# Patient Record
Sex: Female | Born: 1990 | Hispanic: No | Marital: Single | Smoking: Never smoker
Health system: Southern US, Community
[De-identification: ages and names within clinical notes are randomized; demographics above are authoritative.]

## PROBLEM LIST (undated history)

## (undated) DIAGNOSIS — N939 Abnormal uterine and vaginal bleeding, unspecified: Secondary | ICD-10-CM

## (undated) DIAGNOSIS — M1990 Unspecified osteoarthritis, unspecified site: Secondary | ICD-10-CM

## (undated) DIAGNOSIS — K8020 Calculus of gallbladder without cholecystitis without obstruction: Secondary | ICD-10-CM

## (undated) DIAGNOSIS — J4530 Mild persistent asthma, uncomplicated: Secondary | ICD-10-CM

## (undated) DIAGNOSIS — R102 Pelvic and perineal pain unspecified side: Secondary | ICD-10-CM

## (undated) DIAGNOSIS — M797 Fibromyalgia: Secondary | ICD-10-CM

## (undated) DIAGNOSIS — J45909 Unspecified asthma, uncomplicated: Secondary | ICD-10-CM

## (undated) DIAGNOSIS — I441 Atrioventricular block, second degree: Secondary | ICD-10-CM

## (undated) DIAGNOSIS — D509 Iron deficiency anemia, unspecified: Secondary | ICD-10-CM

## (undated) DIAGNOSIS — R002 Palpitations: Secondary | ICD-10-CM

## (undated) DIAGNOSIS — Z973 Presence of spectacles and contact lenses: Secondary | ICD-10-CM

## (undated) DIAGNOSIS — F329 Major depressive disorder, single episode, unspecified: Secondary | ICD-10-CM

## (undated) DIAGNOSIS — F411 Generalized anxiety disorder: Secondary | ICD-10-CM

## (undated) DIAGNOSIS — K219 Gastro-esophageal reflux disease without esophagitis: Secondary | ICD-10-CM

## (undated) DIAGNOSIS — G8929 Other chronic pain: Secondary | ICD-10-CM

## (undated) DIAGNOSIS — N809 Endometriosis, unspecified: Secondary | ICD-10-CM

---
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
## 2010-10-15 ENCOUNTER — Inpatient Hospital Stay (INDEPENDENT_AMBULATORY_CARE_PROVIDER_SITE_OTHER)
Admission: RE | Admit: 2010-10-15 | Discharge: 2010-10-15 | Disposition: A | Discharge status: Home / Self Care | Payer: Self-pay | Source: Ambulatory Visit | Attending: Emergency Medicine | Admitting: Emergency Medicine

## 2010-10-15 DIAGNOSIS — R0789 Other chest pain: Secondary | ICD-10-CM

## 2012-10-07 ENCOUNTER — Emergency Department (HOSPITAL_COMMUNITY): Payer: BC Managed Care – PPO

## 2012-10-07 ENCOUNTER — Encounter (HOSPITAL_COMMUNITY): Payer: Self-pay

## 2012-10-07 ENCOUNTER — Emergency Department (HOSPITAL_COMMUNITY)
Admission: EM | Admit: 2012-10-07 | Discharge: 2012-10-07 | Disposition: A | Discharge status: Home / Self Care | Payer: BC Managed Care – PPO | Attending: Emergency Medicine | Admitting: Emergency Medicine

## 2012-10-07 DIAGNOSIS — R071 Chest pain on breathing: Secondary | ICD-10-CM | POA: Insufficient documentation

## 2012-10-07 DIAGNOSIS — Z3202 Encounter for pregnancy test, result negative: Secondary | ICD-10-CM | POA: Insufficient documentation

## 2012-10-07 DIAGNOSIS — J45901 Unspecified asthma with (acute) exacerbation: Secondary | ICD-10-CM | POA: Insufficient documentation

## 2012-10-07 HISTORY — DX: Unspecified asthma, uncomplicated: J45.909

## 2012-10-07 LAB — BASIC METABOLIC PANEL
BUN: 18 mg/dL (ref 6–23)
CO2: 25 mEq/L (ref 19–32)
Chloride: 99 mEq/L (ref 96–112)
GFR calc non Af Amer: >90 mL/min (ref >90)
Glucose, Bld: 89 mg/dL (ref 70–99)
Potassium: 3.7 mEq/L (ref 3.5–5.1)

## 2012-10-07 LAB — CBC
MCHC: 35.4 g/dL (ref 30.0–36.0)
RBC: 4.34 MIL/uL (ref 3.87–5.11)
WBC: 7.4 10*3/uL (ref 4.0–10.5)

## 2012-10-07 LAB — D-DIMER, QUANTITATIVE: D-Dimer, Quant: <0.27 ug/mL-FEU (ref 0.00–0.48)

## 2012-10-07 MED ORDER — ALBUTEROL SULFATE (5 MG/ML) 0.5% IN NEBU
5.0000 mg | INHALATION_SOLUTION | Freq: Once | RESPIRATORY_TRACT | Status: AC
  Administered 2012-10-07: 5 mg via RESPIRATORY_TRACT
  Filled 2012-10-07: qty 1

## 2012-10-07 NOTE — ED Provider Notes (Signed)
CSN: [id number]     Arrival date & time 10/07/12  1130 History   First MD Initiated Contact with Patient 10/07/12 1157     Chief Complaint  Patient presents with  . Chest Pain  . Shortness of Breath   (Consider location/radiation/quality/duration/timing/severity/associated sxs/prior Treatment) HPI  22 year old female with history of asthma presents with complaints of chest pain, and shortness of breath. Patient reports she was in class an hour ago when she developed an achy sensation to her upper back that spread to her back and radiates towards her mid chest. Pain is gradual but has been improving. However she endorses having difficulty breathing as if she cannot catch a full breath. Shortness of breath is worse with exhaling as compared to inhaling.  Does report mild shortness of breath with walking. Does report mild pleuritic chest discomfort. She denies fever, chills, productive cough, hemoptysis, nausea, vomiting, diarrhea, abdominal pain, lightheadedness, dizziness, numbness, weakness, calf pain, or leg swelling. Patient does take birth control pill. Is a nonsmoker. No prior history of PE or DVT, cancer, recent surgery, prolonged bed rest. No recent trauma. No family history of premature cardiac death.  Past Medical History  Diagnosis Date  . Asthma    History reviewed. No pertinent past surgical history. History reviewed. No pertinent family history. History  Substance Use Topics  . Smoking status: Never Smoker   . Smokeless tobacco: Not on file  . Alcohol Use: No   OB History   Grav Para Term Preterm Abortions TAB SAB Ect Mult Living                 Review of Systems  Constitutional: Negative for fever.  Respiratory: Positive for chest tightness and shortness of breath. Negative for cough and wheezing.   Cardiovascular: Positive for chest pain. Negative for palpitations.  Skin: Negative for rash.  Neurological: Negative for numbness.  All other systems reviewed and are  negative.    Allergies  Review of patient's allergies indicates not on file.  Home Medications  No current outpatient prescriptions on file. BP 131/78  Pulse 98  Temp(Src) 98.5 F (36.9 C) (Oral)  Wt 190 lb (86.183 kg)  SpO2 100% Physical Exam  Nursing note and vitals reviewed. Constitutional: She is oriented to person, place, and time. She appears well-developed and well-nourished.  Mildly anxious   HENT:  Head: Atraumatic.  Mouth/Throat: Oropharynx is clear and moist.  Eyes: Conjunctivae are normal.  Neck: Normal range of motion. Neck supple. No JVD present. No tracheal deviation present.  Cardiovascular:  Mild tachycardia without murmurs, rubs, or gallops noted  Pulmonary/Chest: Effort normal and breath sounds normal. No stridor. No respiratory distress. She has no wheezes. She exhibits tenderness (Mild anterior chest wall tenderness on palpation (chronic), no paradoxical movement, no crepitus, no emphysema, no rash).  Lungs are clear to auscultation bilaterally  Abdominal: Soft. There is no tenderness.  Musculoskeletal: She exhibits no edema.  Bilateral lower extremities without palpable cord, erythema, edema, negative Homans sign  Lymphadenopathy:    She has no cervical adenopathy.  Neurological: She is alert and oriented to person, place, and time.  Skin: Skin is warm. No rash noted.  Psychiatric: She has a normal mood and affect.    ED Course  Procedures (including critical care time)\   Date: 10/07/2012  Rate: 78  Rhythm: normal sinus rhythm  QRS Axis: normal  Intervals: normal  ST/T Wave abnormalities: normal  Conduction Disutrbances: none  Narrative Interpretation:   Old EKG Reviewed: none  for comparison     12:14 PM Patient here with chest pain and shortness of breath. Although she has a history of asthma she has no wheezes on exam and currently sats are 100% on room air. She is mildly tachycardic and does take birth control pill which increased risk  of the potential PE. We'll obtain a d-dimer and obtain chest x-ray. We'll also check to rule out pneumothorax, low suspicion as her lung exam is unremarkable. Her EKG shows no acute ischemic changes.  2:38 PM D-dimer is negative. Chest x-ray is unremarkable. Labs are reassuring. Pregnancy test negative. Normal troponin and normal EKG. No significant risk factors. Patient felt better although does endorse a mild chest discomfort only. She does have breathing treatment at home. She has a primary care Dr. that she agrees to followup. Return precautions discussed. Otherwise patient stable for discharge at this time.  Lab Review Labs Reviewed  CBC  BASIC METABOLIC PANEL  D-DIMER, QUANTITATIVE  POCT PREGNANCY, URINE  POCT I-STAT TROPONIN I   Imaging Review Dg Chest 2 View  10/07/2012   CLINICAL DATA:  Chest pain with shortness of breath  EXAM: CHEST  2 VIEW  COMPARISON:  Jun 02, 2003  FINDINGS: The lungs are clear. Heart size and pulmonary vascularity are normal. No pneumothorax. No adenopathy. No bone lesions.  IMPRESSION: No abnormality noted.   Electronically Signed   By: Bretta Bang   On: 10/07/2012 13:18    MDM   1. Chest pain on breathing    BP 109/60  Pulse 70  Temp(Src) 98.5 F (36.9 C) (Oral)  Wt 190 lb (86.183 kg)  SpO2 99%  LMP 09/23/2012  I have reviewed nursing notes and vital signs. I personally reviewed the imaging tests through PACS system  I reviewed available ER/hospitalization records thought the EMR     Fayrene Helper, [place] 10/07/12 1439

## 2012-10-07 NOTE — ED Notes (Signed)
MD at bedside. 

## 2012-10-07 NOTE — ED Provider Notes (Signed)
Medical screening examination/treatment/procedure(s) were performed by non-physician practitioner and as supervising physician I was immediately available for consultation/collaboration. Devoria Albe, MD, Armando Gang   Ward Givens, MD 10/07/12 [phone]

## 2012-10-07 NOTE — ED Notes (Signed)
Pt reports chest pain and sob, onset 1 hour ago. No help with inhaler in past, hx of asthma. reprots hard to swallow.

## 2015-11-01 ENCOUNTER — Emergency Department (HOSPITAL_COMMUNITY)
Admission: EM | Admit: 2015-11-01 | Discharge: 2015-11-01 | Disposition: A | Discharge status: Home / Self Care | Payer: BLUE CROSS/BLUE SHIELD | Attending: Emergency Medicine | Admitting: Emergency Medicine

## 2015-11-01 ENCOUNTER — Emergency Department (HOSPITAL_COMMUNITY): Payer: BLUE CROSS/BLUE SHIELD

## 2015-11-01 ENCOUNTER — Encounter (HOSPITAL_COMMUNITY): Payer: Self-pay | Admitting: Emergency Medicine

## 2015-11-01 DIAGNOSIS — M546 Pain in thoracic spine: Secondary | ICD-10-CM | POA: Diagnosis not present

## 2015-11-01 DIAGNOSIS — J45909 Unspecified asthma, uncomplicated: Secondary | ICD-10-CM | POA: Diagnosis not present

## 2015-11-01 DIAGNOSIS — R0602 Shortness of breath: Secondary | ICD-10-CM | POA: Insufficient documentation

## 2015-11-01 DIAGNOSIS — M545 Low back pain: Secondary | ICD-10-CM | POA: Insufficient documentation

## 2015-11-01 DIAGNOSIS — M549 Dorsalgia, unspecified: Secondary | ICD-10-CM

## 2015-11-01 HISTORY — DX: Endometriosis, unspecified: N80.9

## 2015-11-01 LAB — I-STAT CHEM 8, ED
BUN: 19 mg/dL (ref 6–20)
Calcium, Ion: 1.15 mmol/L (ref 1.15–1.40)
Chloride: 105 mmol/L (ref 101–111)
Creatinine, Ser: 0.9 mg/dL (ref 0.44–1.00)
Glucose, Bld: 120 mg/dL — ABNORMAL HIGH (ref 65–99)
HCT: 35 % — ABNORMAL LOW (ref 36.0–46.0)
Hemoglobin: 11.9 g/dL — ABNORMAL LOW (ref 12.0–15.0)
Potassium: 3.8 mmol/L (ref 3.5–5.1)
Sodium: 140 mmol/L (ref 135–145)
TCO2: 23 mmol/L (ref 0–100)

## 2015-11-01 MED ORDER — IBUPROFEN 400 MG PO TABS
600.0000 mg | ORAL_TABLET | Freq: Once | ORAL | Status: AC
  Administered 2015-11-01: 600 mg via ORAL
  Filled 2015-11-01: qty 1

## 2015-11-01 MED ORDER — OXYCODONE-ACETAMINOPHEN 5-325 MG PO TABS
1.0000 | ORAL_TABLET | Freq: Once | ORAL | Status: AC
  Administered 2015-11-01: 1 via ORAL
  Filled 2015-11-01: qty 1

## 2015-11-01 MED ORDER — METHOCARBAMOL 500 MG PO TABS: 500.0000 mg | ORAL_TABLET | Freq: Two times a day (BID) | ORAL | 0 refills | Status: DC

## 2015-11-01 NOTE — ED Notes (Signed)
Patient transported to X-ray 

## 2015-11-01 NOTE — ED Provider Notes (Signed)
[facility]-EMERGENCY DEPT Provider Note   CSN: [id number] Arrival date & time: 11/01/15  0416     History   Chief Complaint Chief Complaint  Patient presents with  . Generalized Body Aches    HPI Carol Marquez is a 25 y.o. female.  Patient with a history of asthma and endometriosis presents with complaint of a sharp, cramping pain in bilateral paraspinal back from upper thoracic to low lumbar areas. No injury. No fever, cough, nausea, other myalgia. She reports mild SOB. No chest pain, vomiting, nausea, diarrhea, urinary symptoms. She reports having similar symptoms in the past that didn't last as long as tonight's episode. She took a Tylenol and 2 OTC ibuprofen for relief without success.   The history is provided by the patient. No language interpreter was used.    Past Medical History:  Diagnosis Date  . Asthma   . Endometriosis     There are no active problems to display for this patient.   History reviewed. No pertinent surgical history.  OB History    No data available       Home Medications    Prior to Admission medications   Medication Sig Start Date End Date Taking? Authorizing Provider  drospirenone-ethinyl estradiol (YAZ,GIANVI,LORYNA) 3-0.02 MG tablet Take 1 tablet by mouth daily.   Yes Historical Provider, MD  ibuprofen (ADVIL,MOTRIN) 200 MG tablet Take 400 mg by mouth every 6 (six) hours as needed for pain or headache.   Yes Historical Provider, MD    Family History No family history on file.  Social History Social History  Substance Use Topics  . Smoking status: Never Smoker  . Smokeless tobacco: Never Used  . Alcohol use No     Allergies   Review of patient's allergies indicates no known allergies.   Review of Systems Review of Systems  Constitutional: Negative for chills and fever.  HENT: Negative.   Respiratory: Positive for shortness of breath. Negative for cough.   Cardiovascular: Negative.  Negative for chest pain.    Gastrointestinal: Negative.  Negative for abdominal pain, diarrhea and nausea.  Genitourinary: Negative.   Musculoskeletal: Positive for back pain. Negative for myalgias and neck pain.  Skin: Negative.  Negative for rash.  Neurological: Negative.  Negative for numbness and headaches.     Physical Exam Updated Vital Signs BP 145/94 (BP Location: Right Arm)   Pulse 90   Temp 97.8 F (36.6 C) (Oral)   Resp 20   Ht 5\' 8"  (1.727 m)   Wt 90.7 kg   LMP 10/11/2015 (Approximate)   SpO2 100%   BMI 30.41 kg/m   Physical Exam  Constitutional: She is oriented to person, place, and time. She appears well-developed and well-nourished.  HENT:  Head: Normocephalic.  Neck: Normal range of motion. Neck supple.  Cardiovascular: Normal rate and regular rhythm.   Pulmonary/Chest: Effort normal and breath sounds normal.  Abdominal: Soft. Bowel sounds are normal. There is no tenderness. There is no rebound and no guarding.  Musculoskeletal: Normal range of motion.  No reproducible tenderness to paraspinal areas. No swelling, redness. FROM of all extremities.   Neurological: She is alert and oriented to person, place, and time.  Skin: Skin is warm and dry. No rash noted.  Psychiatric: She has a normal mood and affect.     ED Treatments / Results  Labs (all labs ordered are listed, but only abnormal results are displayed) Labs Reviewed - No data to display  EKG  EKG Interpretation None  Radiology No results found.  Procedures Procedures (including critical care time)  Medications Ordered in ED Medications - No data to display   Initial Impression / Assessment and Plan / ED Course  I have reviewed the triage vital signs and the nursing notes.  Pertinent labs & imaging results that were available during my care of the patient were reviewed by me and considered in my medical decision making (see chart for details).  Clinical Course   1. Back pain  Patient presents with  complaint of myalgia. No fever, vomiting or concern for infection. She can be discharged home with PCP follow up. Final Clinical Impressions(s) / ED Diagnoses   Final diagnoses:  None    New Prescriptions New Prescriptions   No medications on file     Elpidio AnisShari Ladaija Dimino, Cordelia Poche-C 11/07/15 [id number]    Dione Boozeavid Glick, MD 11/09/15 [phone]

## 2015-11-01 NOTE — ED Triage Notes (Signed)
Patient from home. Reports that she began experiencing generalized body aches approx 0130. Pain 9/10.

## 2015-11-13 ENCOUNTER — Emergency Department (HOSPITAL_COMMUNITY)
Admission: EM | Admit: 2015-11-13 | Discharge: 2015-11-13 | Disposition: A | Discharge status: Home / Self Care | Payer: BLUE CROSS/BLUE SHIELD | Attending: Emergency Medicine | Admitting: Emergency Medicine

## 2015-11-13 ENCOUNTER — Encounter (HOSPITAL_COMMUNITY): Payer: Self-pay

## 2015-11-13 ENCOUNTER — Emergency Department (HOSPITAL_COMMUNITY): Payer: BLUE CROSS/BLUE SHIELD

## 2015-11-13 DIAGNOSIS — N390 Urinary tract infection, site not specified: Secondary | ICD-10-CM | POA: Diagnosis not present

## 2015-11-13 DIAGNOSIS — J45909 Unspecified asthma, uncomplicated: Secondary | ICD-10-CM | POA: Insufficient documentation

## 2015-11-13 DIAGNOSIS — N1330 Unspecified hydronephrosis: Secondary | ICD-10-CM | POA: Diagnosis not present

## 2015-11-13 DIAGNOSIS — R109 Unspecified abdominal pain: Secondary | ICD-10-CM | POA: Diagnosis present

## 2015-11-13 LAB — URINE MICROSCOPIC-ADD ON: RBC / HPF: NONE SEEN RBC/hpf (ref 0–5)

## 2015-11-13 LAB — CBC
HCT: 36.4 % (ref 36.0–46.0)
Hemoglobin: 12.6 g/dL (ref 12.0–15.0)
MCH: 30.4 pg (ref 26.0–34.0)
MCHC: 34.6 g/dL (ref 30.0–36.0)
MCV: 87.7 fL (ref 78.0–100.0)
PLATELETS: 205 10*3/uL (ref 150–400)
RBC: 4.15 MIL/uL (ref 3.87–5.11)
RDW: 12.4 % (ref 11.5–15.5)
WBC: 8.3 10*3/uL (ref 4.0–10.5)

## 2015-11-13 LAB — COMPREHENSIVE METABOLIC PANEL
ALBUMIN: 3.6 g/dL (ref 3.5–5.0)
ALK PHOS: 49 U/L (ref 38–126)
ALT: 17 U/L (ref 14–54)
AST: 16 U/L (ref 15–41)
Anion gap: 7 (ref 5–15)
BILIRUBIN TOTAL: 0.8 mg/dL (ref 0.3–1.2)
BUN: 15 mg/dL (ref 6–20)
CALCIUM: 9.1 mg/dL (ref 8.9–10.3)
CO2: 23 mmol/L (ref 22–32)
CREATININE: 0.88 mg/dL (ref 0.44–1.00)
Chloride: 106 mmol/L (ref 101–111)
GFR calc Af Amer: >60 mL/min (ref >60)
GLUCOSE: 96 mg/dL (ref 65–99)
Potassium: 3.4 mmol/L — ABNORMAL LOW (ref 3.5–5.1)
Sodium: 136 mmol/L (ref 135–145)
TOTAL PROTEIN: 6.7 g/dL (ref 6.5–8.1)

## 2015-11-13 LAB — URINALYSIS, ROUTINE W REFLEX MICROSCOPIC
Bilirubin Urine: NEGATIVE
Glucose, UA: NEGATIVE mg/dL
Hgb urine dipstick: NEGATIVE
Ketones, ur: NEGATIVE mg/dL
Nitrite: NEGATIVE
PROTEIN: NEGATIVE mg/dL
Specific Gravity, Urine: 1.015 (ref 1.005–1.030)
pH: 6 (ref 5.0–8.0)

## 2015-11-13 LAB — I-STAT BETA HCG BLOOD, ED (MC, WL, AP ONLY)

## 2015-11-13 LAB — LIPASE, BLOOD: Lipase: 29 U/L (ref 11–51)

## 2015-11-13 MED ORDER — CEPHALEXIN 250 MG PO CAPS
500.0000 mg | ORAL_CAPSULE | Freq: Once | ORAL | Status: AC
Start: 2015-11-13 — End: 2015-11-13
  Administered 2015-11-13: 500 mg via ORAL
  Filled 2015-11-13: qty 2

## 2015-11-13 MED ORDER — ONDANSETRON 4 MG PO TBDP
8.0000 mg | ORAL_TABLET | Freq: Once | ORAL | Status: AC
  Administered 2015-11-13: 8 mg via ORAL
  Filled 2015-11-13: qty 2

## 2015-11-13 MED ORDER — CEPHALEXIN 500 MG PO CAPS: 500.0000 mg | ORAL_CAPSULE | Freq: Three times a day (TID) | ORAL | 0 refills | Status: DC

## 2015-11-13 MED ORDER — IOPAMIDOL (ISOVUE-300) INJECTION 61%
INTRAVENOUS | Status: AC
  Administered 2015-11-13: 100 mL
  Filled 2015-11-13: qty 100

## 2015-11-13 MED ORDER — IBUPROFEN 600 MG PO TABS: 600.0000 mg | ORAL_TABLET | Freq: Four times a day (QID) | ORAL | 0 refills | Status: DC | PRN

## 2015-11-13 MED ORDER — OXYCODONE-ACETAMINOPHEN 5-325 MG PO TABS
1.0000 | ORAL_TABLET | Freq: Once | ORAL | Status: AC
  Administered 2015-11-13: 1 via ORAL
  Filled 2015-11-13 (×2): qty 1

## 2015-11-13 NOTE — ED Notes (Signed)
Pt states she understands instructions. Home stable with Mom. Steady gait.

## 2015-11-13 NOTE — ED Notes (Signed)
Pt returns from CT. States pain 4 but intermittently 6.

## 2015-11-13 NOTE — ED Notes (Signed)
Pt aware of need for urine  

## 2015-11-13 NOTE — ED Notes (Signed)
Iv attempted x1 withut success.

## 2015-11-13 NOTE — ED Provider Notes (Signed)
[facility] Provider Note   CSN: [id number] Arrival date & time: 11/13/15  [id number]     History   Chief Complaint Chief Complaint  Patient presents with  . Abdominal Pain  . Flank Pain    right and left    HPI Carol LagerSierra Mosquera is a 25 y.o. female.  HPI Carol Marquez is a 25 y.o. female with history of endometriosis, presents to emergency department complaining of abdominal pain and back pain. Patient states she has had back pains for approximately a month. She states abdominal pain started several days ago. She was seen 1 week ago for the same, had blood work done [employer](Chem[facility]) and x-ray of the back which did not show anything. Patient states she continues to have intermittent symptoms. She reports associated nausea, denies vomiting. Denies food making her symptoms worse. She states "my pain is under my ribs." She states movement is making her pain worse. She has been taking Robaxin that she was prescribed here which has not helped. She states she is having normal bowel movements, last bowel movement was yesterday. She denies any urinary symptoms or hematuria. She denies any prior abdominal surgeries. No vaginal discharge or bleeding.  Past Medical History:  Diagnosis Date  . Asthma   . Endometriosis     There are no active problems to display for this patient.   History reviewed. No pertinent surgical history.  OB History    No data available       Home Medications    Prior to Admission medications   Medication Sig Start Date End Date Taking? Authorizing Provider  drospirenone-ethinyl estradiol (YAZ,GIANVI,LORYNA) 3-0.02 MG tablet Take 1 tablet by mouth daily.   Yes Historical Provider, MD  methocarbamol (ROBAXIN) 500 MG tablet Take 1 tablet (500 mg total) by mouth 2 (two) times daily. 11/01/15  Yes Elpidio AnisShari Upstill, PA-C    Family History History reviewed. No pertinent family history.  Social History Social History  Substance Use Topics  . Smoking status: Never  Smoker  . Smokeless tobacco: Never Used  . Alcohol use No     Allergies   Patient has no known allergies.   Review of Systems Review of Systems  Constitutional: Negative for chills and fever.  Respiratory: Negative for cough, chest tightness and shortness of breath.   Cardiovascular: Negative for chest pain, palpitations and leg swelling.  Gastrointestinal: Positive for abdominal pain and nausea. Negative for diarrhea and vomiting.  Genitourinary: Positive for flank pain. Negative for dysuria and pelvic pain.  Musculoskeletal: Positive for arthralgias, back pain and myalgias. Negative for neck pain and neck stiffness.  Skin: Negative for rash.  Neurological: Negative for dizziness, weakness and headaches.  All other systems reviewed and are negative.    Physical Exam Updated Vital Signs BP 120/72   Pulse 75   Temp 97.5 F (36.4 C) (Oral)   Resp 20   Ht 5\' 8"  (1.727 m)   Wt 90.7 kg   LMP 11/07/2015   SpO2 100%   BMI 30.41 kg/m   Physical Exam  Constitutional: She appears well-developed and well-nourished. No distress.  HENT:  Head: Normocephalic.  Eyes: Conjunctivae are normal.  Neck: Neck supple.  Cardiovascular: Normal rate, regular rhythm and normal heart sounds.   Pulmonary/Chest: Effort normal and breath sounds normal. No respiratory distress. She has no wheezes. She has no rales.  Abdominal: Soft. Bowel sounds are normal. She exhibits no distension. There is tenderness. There is no rebound.  diffuse  Musculoskeletal: She exhibits no edema.  Neurological: She is alert.  Skin: Skin is warm and dry.  Psychiatric: She has a normal mood and affect. Her behavior is normal.  Nursing note and vitals reviewed.    ED Treatments / Results  Labs (all labs ordered are listed, but only abnormal results are displayed) Labs Reviewed  COMPREHENSIVE METABOLIC PANEL - Abnormal; Notable for the following:       Result Value   Potassium 3.4 (*)    All other components  within normal limits  LIPASE, BLOOD  CBC  URINALYSIS, ROUTINE W REFLEX MICROSCOPIC (NOT AT [facility])  I-STAT BETA HCG BLOOD, ED ([facility], [facility], [facility] ONLY)    EKG  EKG Interpretation None       Radiology Ct Abdomen Pelvis W Contrast  Result Date: 11/13/2015 CLINICAL DATA:  Epigastric pain for 2 months along with back pain. Cramping and bloating. EXAM: CT ABDOMEN AND PELVIS WITH CONTRAST TECHNIQUE: Multidetector CT imaging of the abdomen and pelvis was performed using the standard protocol following bolus administration of intravenous contrast. CONTRAST:  ISOVUE-300 IOPAMIDOL (ISOVUE-300) INJECTION 61% COMPARISON:  None. FINDINGS: Lower chest: Minimal dependent atelectasis in the lung bases. No pleural effusion. Hepatobiliary: No focal liver abnormality is seen. No gallstones, gallbladder wall thickening, or biliary dilatation. Pancreas: Unremarkable. Spleen: Unremarkable. Adrenals/Urinary Tract: Unremarkable adrenal glands. Very mild right hydronephrosis. No renal calculi, mass, ureteral calculi, or ureteral dilatation. Unremarkable bladder. Stomach/Bowel: The stomach is within normal limits. There is no evidence of bowel obstruction or wall thickening. The appendix is retrocecal in location and unremarkable. Vascular/Lymphatic: No significant vascular findings are present. No enlarged abdominal or pelvic lymph nodes. Reproductive: Uterus and bilateral adnexa are unremarkable. Other: No intraperitoneal free fluid. No abdominal wall mass or hernia. Musculoskeletal: No acute or significant osseous findings. IMPRESSION: 1. Very mild right hydronephrosis, of uncertain significance. 2. Otherwise unremarkable examination. Electronically Signed   By: Sebastian Ache M.D.   On: 11/13/2015 11:32    Procedures Procedures (including critical care time)  Medications Ordered in ED Medications  oxyCODONE-acetaminophen (PERCOCET/ROXICET) 5-325 MG per tablet 1 tablet (1 tablet Oral Given 11/13/15 0701)  ondansetron  (ZOFRAN-ODT) disintegrating tablet 8 mg (8 mg Oral Given 11/13/15 0655)  iopamidol (ISOVUE-300) 61 % injection (100 mLs  Contrast Given 11/13/15 1101)  cephALEXin (KEFLEX) capsule 500 mg (500 mg Oral Given 11/13/15 1230)     Initial Impression / Assessment and Plan / ED Course  I have reviewed the triage vital signs and the nursing notes.  Pertinent labs & imaging results that were available during my care of the patient were reviewed by me and considered in my medical decision making (see chart for details).  Clinical Course     Pt in ED with diffuse lower back pain and abdominal pain with associated vomiting. Pt is afebrile, non toxic appearing. Will check labs.   7:52 AM Labs all unremarkable. Pt continues to have abdominal pain radiating into the back. UA pending. Not pregnant. Will get CT abd/pelvis.   UA showing many bacteria. Will treat with keflex. CT showing non specific hydronephrosis. Will have her follow up with pcp. Pt otherwise non toxic appearing. Stable for dc home.  Vitals:   11/13/15 1030 11/13/15 1045 11/13/15 1140 11/13/15 1226  BP: 113/66 113/64 139/67   Pulse: 71 70 72 75  Resp: 16 16 10 17   Temp:    98.7 F (37.1 C)  TempSrc:      SpO2: 97% 97% 100% 100%  Weight:      Height:  Final Clinical Impressions(s) / ED Diagnoses   Final diagnoses:  Urinary tract infection without hematuria, site unspecified  Hydronephrosis, unspecified hydronephrosis type    New Prescriptions Discharge Medication List as of 11/13/2015 11:53 AM    START taking these medications   Details  cephALEXin (KEFLEX) 500 MG capsule Take 1 capsule (500 mg total) by mouth 3 (three) times daily., Starting Sun 11/13/2015, Print    ibuprofen (ADVIL,MOTRIN) 600 MG tablet Take 1 tablet (600 mg total) by mouth every 6 (six) hours as needed., Starting Sun 11/13/2015, Print         Jaynie Crumbleatyana Meliya Mcconahy, PA-C 11/13/15 1537    Jacalyn LefevreJulie Haviland, MD 11/13/15 [phone]

## 2015-11-13 NOTE — ED Triage Notes (Signed)
Pt from home with flank and abdominal pain that started yesterday that feels like it is cramping. Pt has not had any issues with BM or urination.

## 2015-11-13 NOTE — Discharge Instructions (Signed)
Keflex as prescribed until all gone. Take ibuprofen for pain. Follow up with primary care doctor if pain not improving for further evaluation. Return if vomiting, unable to keep medications down, worsening pain, any new concerning symptom.

## 2015-11-14 LAB — URINE CULTURE

## 2016-11-07 ENCOUNTER — Ambulatory Visit (HOSPITAL_COMMUNITY)
Admission: EM | Admit: 2016-11-07 | Discharge: 2016-11-07 | Disposition: A | Discharge status: Home / Self Care | Payer: BLUE CROSS/BLUE SHIELD | Attending: Family Medicine | Admitting: Family Medicine

## 2016-11-07 ENCOUNTER — Encounter (HOSPITAL_COMMUNITY): Payer: Self-pay | Admitting: Emergency Medicine

## 2016-11-07 DIAGNOSIS — A084 Viral intestinal infection, unspecified: Secondary | ICD-10-CM | POA: Insufficient documentation

## 2016-11-07 DIAGNOSIS — R110 Nausea: Secondary | ICD-10-CM | POA: Insufficient documentation

## 2016-11-07 DIAGNOSIS — M797 Fibromyalgia: Secondary | ICD-10-CM | POA: Insufficient documentation

## 2016-11-07 DIAGNOSIS — J45909 Unspecified asthma, uncomplicated: Secondary | ICD-10-CM | POA: Insufficient documentation

## 2016-11-07 HISTORY — DX: Fibromyalgia: M79.7

## 2016-11-07 LAB — POCT URINALYSIS DIP (DEVICE)
Bilirubin Urine: NEGATIVE
Glucose, UA: NEGATIVE mg/dL
Hgb urine dipstick: NEGATIVE
KETONES UR: NEGATIVE mg/dL
Nitrite: NEGATIVE
PH: 7.5 (ref 5.0–8.0)
PROTEIN: NEGATIVE mg/dL
SPECIFIC GRAVITY, URINE: 1.02 (ref 1.005–1.030)
Urobilinogen, UA: 0.2 mg/dL (ref 0.0–1.0)

## 2016-11-07 LAB — POCT PREGNANCY, URINE: Preg Test, Ur: NEGATIVE

## 2016-11-07 MED ORDER — ONDANSETRON 8 MG PO TBDP: 8.0000 mg | ORAL_TABLET | Freq: Three times a day (TID) | ORAL | 0 refills | Status: DC | PRN

## 2016-11-07 NOTE — ED Provider Notes (Signed)
[facility]   [id number] 11/07/16 Arrival Time: 1256   SUBJECTIVE:  Carol Marquez is a 26 y.o. female who presents to the urgent care with complaint of nausea and body aches x 3 days; pt sts LMP was in early October.  Patient has not vomited, has no abdominal pain or breast tenderness, and has no diarrhea.  No rash or fever or sore throat or cough.  Patient works for a [employer].  She is sexually active and uses condoms.  She has a h/o endometriosis.   Past Medical History:  Diagnosis Date  . Asthma   . Endometriosis   . Fibromyalgia    History reviewed. No pertinent family history. Social History   Social History  . Marital status: Single    Spouse name: N/A  . Number of children: N/A  . Years of education: N/A   Occupational History  . Not on file.   Social History Main Topics  . Smoking status: Never Smoker  . Smokeless tobacco: Never Used  . Alcohol use No  . Drug use: No  . Sexual activity: Not on file   Other Topics Concern  . Not on file   Social History Narrative  . No narrative on file   No outpatient prescriptions have been marked as taking for the 11/07/16 encounter [facility](Hospital Encounter).   No Known Allergies    ROS: As per HPI, remainder of ROS negative.   OBJECTIVE:   Vitals:   11/07/16 1309  BP: (!) 135/91  Pulse: 82  Resp: 18  Temp: 98.4 F (36.9 C)  TempSrc: Oral  SpO2: 97%     General appearance: alert; no distress Eyes: PERRL; EOMI; conjunctiva normal HENT: normocephalic; atraumatic;  TM's normal;  external ears normal without trauma; nasal mucosa normal; oral mucosa normal Neck: supple Lungs: clear to auscultation bilaterally Heart: regular rate and rhythm Abdomen: soft, non-tender; bowel sounds normal; no masses or organomegaly; no guarding or rebound tenderness Back: no CVA tenderness Extremities: no cyanosis or edema; symmetrical with no gross deformities Skin: warm and dry Neurologic: normal  gait; grossly normal Psychological: alert and cooperative; normal mood and affect      Labs:  Results for orders placed or performed during the hospital encounter of 11/07/16  POCT urinalysis dip (device)  Result Value Ref Range   Glucose, UA NEGATIVE NEGATIVE mg/dL   Bilirubin Urine NEGATIVE NEGATIVE   Ketones, ur NEGATIVE NEGATIVE mg/dL   Specific Gravity, Urine 1.020 1.005 - 1.030   Hgb urine dipstick NEGATIVE NEGATIVE   pH 7.5 5.0 - 8.0   Protein, ur NEGATIVE NEGATIVE mg/dL   Urobilinogen, UA 0.2 0.0 - 1.0 mg/dL   Nitrite NEGATIVE NEGATIVE   Leukocytes, UA SMALL (A) NEGATIVE  Pregnancy, urine POC  Result Value Ref Range   Preg Test, Ur NEGATIVE NEGATIVE    Labs Reviewed  POCT URINALYSIS DIP (DEVICE) - Abnormal; Notable for the following:       Result Value   Leukocytes, UA SMALL (*)    All other components within normal limits  URINE CULTURE  POCT PREGNANCY, URINE    No results found.     ASSESSMENT & PLAN:  1. Viral gastroenteritis     Meds ordered this encounter  Medications  . ondansetron (ZOFRAN-ODT) 8 MG disintegrating tablet    Sig: Take 1 tablet (8 mg total) by mouth every 8 (eight) hours as needed for nausea.    Dispense:  12 tablet    Refill:  0  Reviewed expectations re: course of current medical issues. Questions answered. Outlined signs and symptoms indicating need for more acute intervention. Patient verbalized understanding. After Visit Summary given.      Elvina Sidle, MD 11/07/16 1401

## 2016-11-07 NOTE — ED Triage Notes (Signed)
Pt sts nausea and body aches x 3 days; pt sts LMP was 09/23/16

## 2016-11-08 LAB — URINE CULTURE

## 2017-06-30 ENCOUNTER — Emergency Department (HOSPITAL_COMMUNITY): Payer: Self-pay

## 2017-06-30 ENCOUNTER — Encounter (HOSPITAL_COMMUNITY): Payer: Self-pay | Admitting: Emergency Medicine

## 2017-06-30 ENCOUNTER — Other Ambulatory Visit: Payer: Self-pay

## 2017-06-30 ENCOUNTER — Emergency Department (HOSPITAL_COMMUNITY)
Admission: EM | Admit: 2017-06-30 | Discharge: 2017-06-30 | Disposition: A | Discharge status: Home / Self Care | Payer: Self-pay | Attending: Emergency Medicine | Admitting: Emergency Medicine

## 2017-06-30 DIAGNOSIS — K8020 Calculus of gallbladder without cholecystitis without obstruction: Secondary | ICD-10-CM | POA: Insufficient documentation

## 2017-06-30 DIAGNOSIS — J45909 Unspecified asthma, uncomplicated: Secondary | ICD-10-CM | POA: Insufficient documentation

## 2017-06-30 DIAGNOSIS — R1011 Right upper quadrant pain: Secondary | ICD-10-CM

## 2017-06-30 DIAGNOSIS — K8050 Calculus of bile duct without cholangitis or cholecystitis without obstruction: Secondary | ICD-10-CM

## 2017-06-30 LAB — CBC
HCT: 40.9 % (ref 36.0–46.0)
HEMOGLOBIN: 13.7 g/dL (ref 12.0–15.0)
MCH: 30.4 pg (ref 26.0–34.0)
MCHC: 33.5 g/dL (ref 30.0–36.0)
MCV: 90.7 fL (ref 78.0–100.0)
PLATELETS: 213 10*3/uL (ref 150–400)
RBC: 4.51 MIL/uL (ref 3.87–5.11)
RDW: 11.9 % (ref 11.5–15.5)
WBC: 5.5 10*3/uL (ref 4.0–10.5)

## 2017-06-30 LAB — URINALYSIS, ROUTINE W REFLEX MICROSCOPIC
BILIRUBIN URINE: NEGATIVE
GLUCOSE, UA: NEGATIVE mg/dL
Hgb urine dipstick: NEGATIVE
KETONES UR: NEGATIVE mg/dL
Nitrite: NEGATIVE
PH: 5 (ref 5.0–8.0)
PROTEIN: NEGATIVE mg/dL
Specific Gravity, Urine: 1.019 (ref 1.005–1.030)

## 2017-06-30 LAB — COMPREHENSIVE METABOLIC PANEL
ALT: 14 U/L (ref 14–54)
ANION GAP: 9 (ref 5–15)
AST: 15 U/L (ref 15–41)
Albumin: 4 g/dL (ref 3.5–5.0)
Alkaline Phosphatase: 51 U/L (ref 38–126)
BUN: 18 mg/dL (ref 6–20)
CHLORIDE: 105 mmol/L (ref 101–111)
CO2: 24 mmol/L (ref 22–32)
CREATININE: 0.83 mg/dL (ref 0.44–1.00)
Calcium: 9.1 mg/dL (ref 8.9–10.3)
Glucose, Bld: 99 mg/dL (ref 65–99)
POTASSIUM: 4.1 mmol/L (ref 3.5–5.1)
SODIUM: 138 mmol/L (ref 135–145)
Total Bilirubin: 0.8 mg/dL (ref 0.3–1.2)
Total Protein: 6.9 g/dL (ref 6.5–8.1)

## 2017-06-30 LAB — LIPASE, BLOOD: LIPASE: 28 U/L (ref 11–51)

## 2017-06-30 LAB — I-STAT BETA HCG BLOOD, ED (MC, WL, AP ONLY): I-stat hCG, quantitative: <5 m[IU]/mL (ref <5)

## 2017-06-30 MED ORDER — HYDROCODONE-ACETAMINOPHEN 5-325 MG PO TABS: 1.0000 | ORAL_TABLET | Freq: Four times a day (QID) | ORAL | 0 refills | Status: DC | PRN

## 2017-06-30 MED ORDER — SODIUM CHLORIDE 0.9 % IV BOLUS
1000.0000 mL | Freq: Once | INTRAVENOUS | Status: AC
  Administered 2017-06-30: 1000 mL via INTRAVENOUS

## 2017-06-30 MED ORDER — ONDANSETRON HCL 4 MG PO TABS: 4.0000 mg | ORAL_TABLET | Freq: Three times a day (TID) | ORAL | 0 refills | Status: DC | PRN

## 2017-06-30 MED ORDER — MORPHINE SULFATE (PF) 4 MG/ML IV SOLN
2.0000 mg | Freq: Once | INTRAVENOUS | Status: AC
  Administered 2017-06-30: 2 mg via INTRAVENOUS
  Filled 2017-06-30: qty 1

## 2017-06-30 NOTE — ED Notes (Signed)
Pt able to tolerate fluids w/o nausea.  

## 2017-06-30 NOTE — ED Notes (Signed)
Patient verbalizes understanding of discharge instructions. Opportunity for questioning and answers were provided. Armband removed by staff, pt discharged from ED ambulatory.   

## 2017-06-30 NOTE — ED Notes (Signed)
Patient transported to Ultrasound 

## 2017-06-30 NOTE — ED Provider Notes (Signed)
[facility] EMERGENCY DEPARTMENT Provider Note   CSN: [id number] Arrival date & time: 06/30/17  1136     History   Chief Complaint Chief Complaint  Patient presents with  . Abdominal Pain    HPI Carol Marquez is a 27 y.o. female presenting for evaluation of right upper quadrant pain, nausea, diarrhea.  Patient states she has had issues with her right upper quadrant intermittently for the past several years.  She was told that she has gallstones, but has never followed up with surgery.  She states that since Friday, 3 days ago, she has had worsening pain.  She has associated nausea without vomiting.  She denies fevers, chills, chest pain, shortness of breath, urinary symptoms.  She states she has had diarrhea for the past week, is nonbloody.  She reports pain begins in the right upper quadrant, radiates to the rest of her abdomen.  She has a history of fibromyalgia and endometriosis, does not take any medications daily.  She has not taken anything for pain including Tylenol or ibuprofen.  Food sometimes makes her symptoms better, sometimes makes them worse.  No change with urination or bowel movements.  Pain is described as a stabbing pain, it is constant.  HPI  Past Medical History:  Diagnosis Date  . Asthma   . Endometriosis   . Fibromyalgia     There are no active problems to display for this patient.   No past surgical history on file.   OB History   None      Home Medications    Prior to Admission medications   Medication Sig Start Date End Date Taking? Authorizing Provider  anti-nausea (EMETROL) solution Take 30 mLs by mouth every 15 (fifteen) minutes as needed for nausea or vomiting.   Yes [provider]  bismuth subsalicylate (PEPTO BISMOL) 262 MG/15ML suspension Take 30 mLs by mouth every 6 (six) hours as needed for indigestion or diarrhea or loose stools.   Yes [provider]  ibuprofen (ADVIL,MOTRIN) 200 MG tablet Take  600 mg by mouth every 4 (four) hours as needed for fever, headache, mild pain, moderate pain or cramping.   Yes [provider]  HYDROcodone-acetaminophen (NORCO/VICODIN) 5-325 MG tablet Take 1 tablet by mouth every 6 (six) hours as needed for severe pain. 06/30/17   Alaa Eyerman, PA-C  ondansetron (ZOFRAN) 4 MG tablet Take 1 tablet (4 mg total) by mouth every 8 (eight) hours as needed for nausea or vomiting. 06/30/17   Jonne Rote, PA-C  ondansetron (ZOFRAN-ODT) 8 MG disintegrating tablet Take 1 tablet (8 mg total) by mouth every 8 (eight) hours as needed for nausea. Patient not taking: Reported on 06/30/2017 11/07/16   Elvina Sidle, MD    Family History No family history on file.  Social History Social History   Tobacco Use  . Smoking status: Never Smoker  . Smokeless tobacco: Never Used  Substance Use Topics  . Alcohol use: No  . Drug use: No     Allergies   Codeine   Review of Systems Review of Systems  Gastrointestinal: Positive for abdominal pain, diarrhea and nausea.  All other systems reviewed and are negative.    Physical Exam Updated Vital Signs BP 120/70   Pulse 64   Temp 98.6 F (37 C) (Oral)   Resp 16   Ht 5\' 9"  (1.753 m)   Wt 88.5 kg (195 lb)   LMP 06/10/2017   SpO2 98%   BMI 28.80 kg/m  Physical Exam  Constitutional: She is oriented to person, place, and time. She appears well-developed and well-nourished. No distress.  Appears nontoxic  HENT:  Head: Normocephalic and atraumatic.  Eyes: Pupils are equal, round, and reactive to light. Conjunctivae and EOM are normal.  Neck: Normal range of motion. Neck supple.  Cardiovascular: Normal rate, regular rhythm and intact distal pulses.  Pulmonary/Chest: Effort normal and breath sounds normal. No respiratory distress. She has no wheezes.  Abdominal: Soft. Bowel sounds are normal. She exhibits no distension and no mass. There is tenderness in the right upper quadrant. There is  positive Murphy's sign. There is no rigidity, no rebound, no guarding, no CVA tenderness and no tenderness at McBurney's point.  Tenderness palpation of right upper quadrant.  Positive Murphy's.  No rigidity, guarding or distention.  Negative rebound.  Normoactive bowel sounds.  Musculoskeletal: Normal range of motion.  Neurological: She is alert and oriented to person, place, and time.  Skin: Skin is warm and dry.  Psychiatric: She has a normal mood and affect.  Nursing note and vitals reviewed.    ED Treatments / Results  Labs (all labs ordered are listed, but only abnormal results are displayed) Labs Reviewed  URINALYSIS, ROUTINE W REFLEX MICROSCOPIC - Abnormal; Notable for the following components:      Result Value   Leukocytes, UA TRACE (*)    Bacteria, UA RARE (*)    All other components within normal limits  LIPASE, BLOOD  COMPREHENSIVE METABOLIC PANEL  CBC  I-STAT BETA HCG BLOOD, ED ([facility], [facility], [facility] ONLY)    EKG None  Radiology US Abdomen Limited Ruq  Result Date: 06/30/2017 CLINICAL DATA:  Right upper quadrant pain radiating to the back for 3 days. EXAM: ULTRASOUND ABDOMEN LIMITED RIGHT UPPER QUADRANT COMPARISON:  CT abdomen and pelvis 11/13/2015 FINDINGS: Gallbladder: 2 cm nonmobile gallstone. No gallbladder wall thickening. No sonographic Murphy sign noted by sonographer. Common bile duct: Diameter: 4 mm Liver: No focal lesion identified. Within normal limits in parenchymal echogenicity. Portal vein is patent on color Doppler imaging with normal direction of blood flow towards the liver. IMPRESSION: 2 cm gallstone without evidence of acute cholecystitis. Electronically Signed   By: Sebastian Ache M.D.   On: 06/30/2017 15:31    Procedures Procedures (including critical care time)  Medications Ordered in ED Medications  sodium chloride 0.9 % bolus 1,000 mL (0 mLs Intravenous Stopped 06/30/17 1603)  morphine 4 MG/ML injection 2 mg (2 mg Intravenous Given 06/30/17 1345)      Initial Impression / Assessment and Plan / ED Course  I have reviewed the triage vital signs and the nursing notes.  Pertinent labs & imaging results that were available during my care of the patient were reviewed by me and considered in my medical decision making (see chart for details).     Presenting for evaluation of right upper quadrant pain, nausea, diarrhea.  History of gallstones, has not followed up with surgery.  She is afebrile not tachycardic, appears nontoxic.  Right upper quadrant pain with positive Murphy's.  Will obtain labs, urine, and right upper quadrant ultrasound.  Will give morphine for pain.  Patient states she does not need anything for nausea at this time.  On reassessment, patient reports pain is much improved.  She denies further nausea.  Labs reassuring, no leukocytosis.  Bili and liver enzymes normal.  Ultrasound pending.  Ultrasound shows cholelithiasis without cholecystitis.  Discussed with patient.  Patient reports continued symptom relief.  Will discharge with  symptomatic medications and have patient follow-up with surgery for further management.  PMP checked, pt without narcotic rx in 2 yrs. Pt tolerating liquids. At this time, patient appears safe for discharge.  Return precautions given.  Patient states she understands and agrees to plan.  Final Clinical Impressions(s) / ED Diagnoses   Final diagnoses:  RUQ pain  Biliary colic    ED Discharge Orders        Ordered    ondansetron (ZOFRAN) 4 MG tablet  Every 8 hours PRN     06/30/17 1550    HYDROcodone-acetaminophen (NORCO/VICODIN) 5-325 MG tablet  Every 6 hours PRN     06/30/17 1550       Casidy Alberta, PA-C 06/30/17 1630    Vanetta MuldersZackowski, Scott, MD 07/03/17 [phone]

## 2017-06-30 NOTE — ED Triage Notes (Signed)
Pt states RUQ abdominal pain radiating around the back for 3 days. Pt has nausea, and diarrhea. Tender to palpation.

## 2017-06-30 NOTE — Discharge Instructions (Addendum)
Take ibuprofen 3 times a day with meals as needed for pain. You may take up to 800 mg (4 pills) at a time. Do not take other anti-inflammatories at the same time open (Advil, Motrin, naproxen, Aleve).  Take norco as needed for severe pain. If it is too string, you make take a 1/2 tablet.  Follow up with general surgery for further management.  Return to the ER if you develop fevers, persistent vomiting, severe pain not relieved by medication, or any new or concerning symptoms.

## 2017-06-30 NOTE — ED Notes (Signed)
Returned from U/S

## 2017-08-17 ENCOUNTER — Emergency Department (HOSPITAL_COMMUNITY)
Admission: EM | Admit: 2017-08-17 | Discharge: 2017-08-17 | Disposition: A | Discharge status: Home / Self Care | Payer: Self-pay | Attending: Emergency Medicine | Admitting: Emergency Medicine

## 2017-08-17 ENCOUNTER — Encounter (HOSPITAL_COMMUNITY): Payer: Self-pay

## 2017-08-17 ENCOUNTER — Other Ambulatory Visit: Payer: Self-pay

## 2017-08-17 DIAGNOSIS — J45909 Unspecified asthma, uncomplicated: Secondary | ICD-10-CM | POA: Insufficient documentation

## 2017-08-17 DIAGNOSIS — Z79899 Other long term (current) drug therapy: Secondary | ICD-10-CM | POA: Insufficient documentation

## 2017-08-17 DIAGNOSIS — N390 Urinary tract infection, site not specified: Secondary | ICD-10-CM | POA: Insufficient documentation

## 2017-08-17 LAB — URINALYSIS, ROUTINE W REFLEX MICROSCOPIC
BILIRUBIN URINE: NEGATIVE
GLUCOSE, UA: NEGATIVE mg/dL
Ketones, ur: NEGATIVE mg/dL
NITRITE: NEGATIVE
PH: 5 (ref 5.0–8.0)
Protein, ur: 100 mg/dL — AB
RBC / HPF: >50 RBC/hpf — ABNORMAL HIGH (ref 0–5)
SPECIFIC GRAVITY, URINE: 1.026 (ref 1.005–1.030)

## 2017-08-17 MED ORDER — AMOXICILLIN 500 MG PO CAPS: 500.0000 mg | ORAL_CAPSULE | Freq: Three times a day (TID) | ORAL | 0 refills | Status: DC

## 2017-08-17 NOTE — Discharge Instructions (Signed)
Return if any problems.

## 2017-08-17 NOTE — ED Triage Notes (Signed)
Pt states that she has had frequency and dysuria for that past several months that is getting worse. Denies discharge

## 2017-08-18 NOTE — ED Provider Notes (Signed)
[facility] Provider Note   CSN: [id number] Arrival date & time: 08/17/17  2006     History   Chief Complaint Chief Complaint  Patient presents with  . Dysuria    HPI Carol LagerSierra Marquez is a 27 y.o. female.  The history is provided by the patient. No language interpreter was used.  Dysuria   This is a new problem. The current episode started more than 1 week ago. The problem occurs every urination. The problem has been gradually worsening. The pain is moderate. There has been no fever. Associated symptoms include frequency. Pertinent negatives include no chills. She has tried nothing for the symptoms. Her past medical history does not include recurrent UTIs.    Past Medical History:  Diagnosis Date  . Asthma   . Endometriosis   . Fibromyalgia     There are no active problems to display for this patient.   History reviewed. No pertinent surgical history.   OB History   None      Home Medications    Prior to Admission medications   Medication Sig Start Date End Date Taking? Authorizing Provider  amoxicillin (AMOXIL) 500 MG capsule Take 1 capsule (500 mg total) by mouth 3 (three) times daily. 08/17/17   Elson AreasSofia, Fionn Stracke K, PA-C  anti-nausea (EMETROL) solution Take 30 mLs by mouth every 15 (fifteen) minutes as needed for nausea or vomiting.    [provider]  bismuth subsalicylate (PEPTO BISMOL) 262 MG/15ML suspension Take 30 mLs by mouth every 6 (six) hours as needed for indigestion or diarrhea or loose stools.    [provider]  HYDROcodone-acetaminophen (NORCO/VICODIN) 5-325 MG tablet Take 1 tablet by mouth every 6 (six) hours as needed for severe pain. 06/30/17   Caccavale, Sophia, PA-C  ibuprofen (ADVIL,MOTRIN) 200 MG tablet Take 600 mg by mouth every 4 (four) hours as needed for fever, headache, mild pain, moderate pain or cramping.    [provider]  ondansetron (ZOFRAN) 4 MG tablet Take 1 tablet (4 mg  total) by mouth every 8 (eight) hours as needed for nausea or vomiting. 06/30/17   Caccavale, Sophia, PA-C  ondansetron (ZOFRAN-ODT) 8 MG disintegrating tablet Take 1 tablet (8 mg total) by mouth every 8 (eight) hours as needed for nausea. Patient not taking: Reported on 06/30/2017 11/07/16   Elvina SidleLauenstein, Kurt, MD    Family History No family history on file.  Social History Social History   Tobacco Use  . Smoking status: Never Smoker  . Smokeless tobacco: Never Used  Substance Use Topics  . Alcohol use: No  . Drug use: No     Allergies   Codeine   Review of Systems Review of Systems  Constitutional: Negative for chills.  Genitourinary: Positive for dysuria and frequency.  All other systems reviewed and are negative.    Physical Exam Updated Vital Signs BP (!) 142/116 (BP Location: Right Arm)   Pulse 78   Temp 98.3 F (36.8 C) (Oral)   Resp 16   LMP 08/14/2017   SpO2 99%   Physical Exam  Constitutional: She appears well-developed and well-nourished.  HENT:  Head: Normocephalic.  Eyes: Pupils are equal, round, and reactive to light.  Neck: Normal range of motion.  Cardiovascular: Normal rate.  Pulmonary/Chest: Effort normal.  Abdominal: Soft.  Musculoskeletal: Normal range of motion.  Neurological: She is alert.  Skin: Skin is warm.  Psychiatric: She has a normal mood and affect.  Nursing note and vitals reviewed.  ED Treatments / Results  Labs (all labs ordered are listed, but only abnormal results are displayed) Labs Reviewed  URINALYSIS, ROUTINE W REFLEX MICROSCOPIC - Abnormal; Notable for the following components:      Result Value   APPearance HAZY (*)    Hgb urine dipstick LARGE (*)    Protein, ur 100 (*)    Leukocytes, UA SMALL (*)    RBC / HPF >50 (*)    Bacteria, UA RARE (*)    All other components within normal limits    EKG None  Radiology No results found.  Procedures Procedures (including critical care time)  Medications  Ordered in ED Medications - No data to display   Initial Impression / Assessment and Plan / ED Course  I have reviewed the triage vital signs and the nursing notes.  Pertinent labs & imaging results that were available during my care of the patient were reviewed by me and considered in my medical decision making (see chart for details).    Urine shows 21-50 wbc's  Pt counseled on uti's.     Final Clinical Impressions(s) / ED Diagnoses   Final diagnoses:  Lower urinary tract infectious disease    ED Discharge Orders         Ordered    amoxicillin (AMOXIL) 500 MG capsule  3 times daily     08/17/17 2117        An After Visit Summary was printed and given to the patient.    Elson Areas, PA-C 08/19/17 0002    Sabas Sous, MD 08/19/17 [phone]

## 2017-09-13 ENCOUNTER — Encounter (HOSPITAL_COMMUNITY): Payer: Self-pay

## 2017-09-13 ENCOUNTER — Ambulatory Visit (HOSPITAL_COMMUNITY)
Admission: EM | Admit: 2017-09-13 | Discharge: 2017-09-13 | Disposition: A | Discharge status: Home / Self Care | Payer: Self-pay | Attending: Family Medicine | Admitting: Family Medicine

## 2017-09-13 ENCOUNTER — Other Ambulatory Visit: Payer: Self-pay

## 2017-09-13 DIAGNOSIS — Z113 Encounter for screening for infections with a predominantly sexual mode of transmission: Secondary | ICD-10-CM | POA: Insufficient documentation

## 2017-09-13 DIAGNOSIS — Z885 Allergy status to narcotic agent status: Secondary | ICD-10-CM | POA: Insufficient documentation

## 2017-09-13 LAB — RAPID HIV SCREEN (HIV 1/2 AB+AG)
HIV 1/2 Antibodies: NONREACTIVE
HIV-1 P24 Antigen - HIV24: NONREACTIVE

## 2017-09-13 NOTE — ED Triage Notes (Signed)
Pt states she wants a STD check up.

## 2017-09-13 NOTE — Discharge Instructions (Addendum)
It was nice meeting you!!  We are testing you today for STDs. I am giving you a contact to the women's health clinic for further care and prescription for birth control.

## 2017-09-14 LAB — RPR: RPR Ser Ql: NONREACTIVE

## 2017-09-16 LAB — CERVICOVAGINAL ANCILLARY ONLY
Bacterial vaginitis: NEGATIVE
CHLAMYDIA, DNA PROBE: NEGATIVE
Candida vaginitis: POSITIVE — AB
NEISSERIA GONORRHEA: NEGATIVE
Trichomonas: NEGATIVE

## 2017-09-16 NOTE — ED Provider Notes (Signed)
[facility]    CSN: [id number] Arrival date & time: 09/13/17  0845     History   Chief Complaint Chief Complaint  Patient presents with  . SEXUALLY TRANSMITTED DISEASE    HPI Carol Marquez is a 27 y.o. female.   Is a 27 year old female that presents for STD screening.  Denies any current symptoms.  Ports last checkup was in 2014.  He is sexually active in a monogamous relationship with protected sex.  She is requesting prescription for birth control.  Last menstrual period was 08/14/2017.      Past Medical History:  Diagnosis Date  . Asthma   . Endometriosis   . Fibromyalgia     There are no active problems to display for this patient.   History reviewed. No pertinent surgical history.  OB History   None      Home Medications    Prior to Admission medications   Medication Sig Start Date End Date Taking? Authorizing Provider  amoxicillin (AMOXIL) 500 MG capsule Take 1 capsule (500 mg total) by mouth 3 (three) times daily. Patient not taking: Reported on 09/13/2017 08/17/17   Elson Areas, PA-C  HYDROcodone-acetaminophen (NORCO/VICODIN) 5-325 MG tablet Take 1 tablet by mouth every 6 (six) hours as needed for severe pain. Patient not taking: Reported on 09/13/2017 06/30/17   Caccavale, Sophia, PA-C  ibuprofen (ADVIL,MOTRIN) 200 MG tablet Take 600 mg by mouth every 4 (four) hours as needed for fever, headache, mild pain, moderate pain or cramping.    [provider]  ondansetron (ZOFRAN) 4 MG tablet Take 1 tablet (4 mg total) by mouth every 8 (eight) hours as needed for nausea or vomiting. 06/30/17   Caccavale, Sophia, PA-C  ondansetron (ZOFRAN-ODT) 8 MG disintegrating tablet Take 1 tablet (8 mg total) by mouth every 8 (eight) hours as needed for nausea. Patient not taking: Reported on 06/30/2017 11/07/16   Elvina Sidle, MD    Family History Family History  Problem Relation Age of Onset  . Healthy Mother   . Healthy Father     Social  History Social History   Tobacco Use  . Smoking status: Never Smoker  . Smokeless tobacco: Never Used  Substance Use Topics  . Alcohol use: No  . Drug use: No     Allergies   Codeine   Review of Systems Review of Systems  Constitutional: Negative for fatigue and fever.  Gastrointestinal: Negative for abdominal pain and nausea.  Genitourinary: Negative for decreased urine volume, difficulty urinating, dyspareunia, dysuria, enuresis, flank pain, frequency, genital sores, hematuria, menstrual problem, pelvic pain, urgency, vaginal bleeding, vaginal discharge and vaginal pain.     Physical Exam Triage Vital Signs ED Triage Vitals  Enc Vitals Group     BP 09/13/17 0915 133/82     Pulse Rate 09/13/17 0915 77     Resp 09/13/17 0915 18     Temp 09/13/17 0915 98.1 F (36.7 C)     Temp Source 09/13/17 0915 Oral     SpO2 09/13/17 0915 100 %     Weight 09/13/17 0923 220 lb (99.8 kg)     Height --      Head Circumference --      Peak Flow --      Pain Score 09/13/17 1011 0     Pain Loc --      Pain Edu? --      Excl. in GC? --    No data found.  Updated Vital Signs BP  133/82 (BP Location: Left Arm)   Pulse 77   Temp 98.1 F (36.7 C) (Oral)   Resp 18   Wt 220 lb (99.8 kg)   LMP 08/14/2017   SpO2 100%   BMI 32.49 kg/m   Visual Acuity Right Eye Distance:   Left Eye Distance:   Bilateral Distance:    Right Eye Near:   Left Eye Near:    Bilateral Near:     Physical Exam  Constitutional: She is oriented to person, place, and time. She appears well-developed and well-nourished.  Very pleasant. Non toxic or ill appearing.     HENT:  Head: Normocephalic and atraumatic.  Eyes: Conjunctivae are normal.  Neck: Normal range of motion.  Abdominal: Soft. Bowel sounds are normal. She exhibits no distension and no mass. There is no tenderness. There is no rebound and no guarding. No hernia.  Abdomen soft, non tender. No masses or rebound.   Musculoskeletal: Normal  range of motion.  Neurological: She is alert and oriented to person, place, and time.  Skin: Skin is warm and dry.  Psychiatric: She has a normal mood and affect.  Nursing note and vitals reviewed.     UC Treatments / Results  Labs (all labs ordered are listed, but only abnormal results are displayed) Labs Reviewed  CERVICOVAGINAL ANCILLARY ONLY - Abnormal; Notable for the following components:      Result Value   Candida vaginitis **POSITIVE for Candida species** (*)    All other components within normal limits  RAPID HIV SCREEN (HIV 1/2 AB+AG)  RPR    EKG None  Radiology No results found.  Procedures Procedures (including critical care time)  Medications Ordered in UC Medications - No data to display  Initial Impression / Assessment and Plan / UC Course  I have reviewed the triage vital signs and the nursing notes.  Pertinent labs & imaging results that were available during my care of the patient were reviewed by me and considered in my medical decision making (see chart for details).     STD screen done. Labs pending. Contact information given for women's clinic given for follow-up on birth control.  Final Clinical Impressions(s) / UC Diagnoses   Final diagnoses:  Screening for STD (sexually transmitted disease)     Discharge Instructions     It was nice meeting you!!  We are testing you today for STDs. I am giving you a contact to the women's health clinic for further care and prescription for birth control.    ED Prescriptions    None     Controlled Substance Prescriptions [place] Controlled Substance Registry consulted? Not Applicable   Janace Aris, NP 09/16/17 1718

## 2017-09-17 ENCOUNTER — Telehealth (HOSPITAL_COMMUNITY): Payer: Self-pay

## 2017-09-17 MED ORDER — FLUCONAZOLE 150 MG PO TABS: 150.0000 mg | ORAL_TABLET | Freq: Every day | ORAL | 0 refills | Status: AC

## 2017-09-17 NOTE — Telephone Encounter (Signed)
Pt contacted regarding test for candida (yeast) was positive.  Prescription for fluconazole 150mg po now, repeat dose in 3d if needed, #2 no refills, sent to the pharmacy of record.  Recheck or followup with PCP for further evaluation if symptoms are not improving.  Answered all questions.  

## 2017-09-27 ENCOUNTER — Encounter (HOSPITAL_COMMUNITY): Payer: Self-pay

## 2017-09-27 ENCOUNTER — Ambulatory Visit (HOSPITAL_COMMUNITY)
Admission: EM | Admit: 2017-09-27 | Discharge: 2017-09-27 | Disposition: A | Discharge status: Home / Self Care | Payer: Self-pay | Attending: Urgent Care | Admitting: Urgent Care

## 2017-09-27 DIAGNOSIS — B373 Candidiasis of vulva and vagina: Secondary | ICD-10-CM | POA: Insufficient documentation

## 2017-09-27 DIAGNOSIS — Z8709 Personal history of other diseases of the respiratory system: Secondary | ICD-10-CM | POA: Insufficient documentation

## 2017-09-27 DIAGNOSIS — B3731 Acute candidiasis of vulva and vagina: Secondary | ICD-10-CM

## 2017-09-27 DIAGNOSIS — Z885 Allergy status to narcotic agent status: Secondary | ICD-10-CM | POA: Insufficient documentation

## 2017-09-27 DIAGNOSIS — R350 Frequency of micturition: Secondary | ICD-10-CM | POA: Insufficient documentation

## 2017-09-27 DIAGNOSIS — Z8742 Personal history of other diseases of the female genital tract: Secondary | ICD-10-CM | POA: Insufficient documentation

## 2017-09-27 DIAGNOSIS — Z3202 Encounter for pregnancy test, result negative: Secondary | ICD-10-CM

## 2017-09-27 DIAGNOSIS — R300 Dysuria: Secondary | ICD-10-CM | POA: Insufficient documentation

## 2017-09-27 DIAGNOSIS — M797 Fibromyalgia: Secondary | ICD-10-CM | POA: Insufficient documentation

## 2017-09-27 LAB — POCT URINALYSIS DIP (DEVICE)
BILIRUBIN URINE: NEGATIVE
Glucose, UA: NEGATIVE mg/dL
NITRITE: NEGATIVE
PH: 5.5 (ref 5.0–8.0)
PROTEIN: NEGATIVE mg/dL
Specific Gravity, Urine: >=1.03 (ref 1.005–1.030)
UROBILINOGEN UA: 0.2 mg/dL (ref 0.0–1.0)

## 2017-09-27 MED ORDER — FLUCONAZOLE 150 MG PO TABS: 150.0000 mg | ORAL_TABLET | ORAL | 0 refills | Status: DC

## 2017-09-27 NOTE — ED Provider Notes (Signed)
  MRN: [id number] DOB: [birth year]  Subjective:   Carol Marquez is a [age] y.o. female presenting for 3 week history of dysuria, urinary frequency, urinary urgency and vaginal itching and irritation, pelvic pain (has history of endometriosis) which is unchanged from her normal status. Was seen on 09/13/2017, found to have yeast vaginitis. Patient did not take the prescribed medication due to concerns of having a concurrent UTI. Today, she presents wanting to confirm that she does not have an UTI. Testing was negative for STIs.   No current facility-administered medications for this encounter.   Current Outpatient Medications:  .  ibuprofen (ADVIL,MOTRIN) 200 MG tablet, Take 600 mg by mouth every 4 (four) hours as needed for fever, headache, mild pain, moderate pain or cramping., Disp: , Rfl:  .  ondansetron (ZOFRAN) 4 MG tablet, Take 1 tablet (4 mg total) by mouth every 8 (eight) hours as needed for nausea or vomiting., Disp: 12 tablet, Rfl: 0   Allergies  Allergen Reactions  . Codeine Nausea Only    Past Medical History:  Diagnosis Date  . Asthma   . Endometriosis   . Fibromyalgia      History reviewed. No pertinent surgical history.  Objective:   Vitals: BP 132/76 (BP Location: Right Arm)   Pulse 61   Temp 98.3 F (36.8 C) (Temporal)   Resp 20   LMP 08/25/2017   SpO2 100%   Physical Exam  Constitutional: She is oriented to person, place, and time. She appears well-developed and well-nourished.  Cardiovascular: Normal rate.  Pulmonary/Chest: Effort normal.  Neurological: She is alert and oriented to person, place, and time.    Results for orders placed or performed during the hospital encounter of 09/27/17 (from the past 24 hour(s))  POCT urinalysis dip (device)     Status: Abnormal   Collection Time: 09/27/17  4:58 PM  Result Value Ref Range   Glucose, UA NEGATIVE NEGATIVE mg/dL   Bilirubin Urine NEGATIVE NEGATIVE   Ketones, ur TRACE (A) NEGATIVE mg/dL   Specific  Gravity, Urine >=1.030 1.005 - 1.030   Hgb urine dipstick TRACE (A) NEGATIVE   pH 5.5 5.0 - 8.0   Protein, ur NEGATIVE NEGATIVE mg/dL   Urobilinogen, UA 0.2 0.0 - 1.0 mg/dL   Nitrite NEGATIVE NEGATIVE   Leukocytes, UA SMALL (A) NEGATIVE    Assessment and Plan :   Yeast vaginitis  Urinary frequency  Dysuria  Patient admits that she does not drink water at all.  She is sedated that she try to do that for a couple days but then just stop.  I recommended she get treated for the yeast vaginitis that was found at her last office visit, resend prescription for Diflucan.  Recommended patient hydrate adequately each day with at least 64 ounce water.  Urine culture is pending.   Wallis BambergMani, Alante Tolan, PA-C 09/27/17 1710

## 2017-09-27 NOTE — ED Triage Notes (Signed)
Pt presents with urinary tract and yeast infection symptoms; frequent urination, burning, itching and discomfort.

## 2017-09-30 ENCOUNTER — Telehealth (HOSPITAL_COMMUNITY): Payer: Self-pay

## 2017-09-30 LAB — URINE CULTURE: Culture: 20000 — AB

## 2017-09-30 MED ORDER — SULFAMETHOXAZOLE-TRIMETHOPRIM 800-160 MG PO TABS: 1.0000 | ORAL_TABLET | Freq: Two times a day (BID) | ORAL | 0 refills | Status: AC

## 2018-03-09 ENCOUNTER — Emergency Department (HOSPITAL_COMMUNITY)
Admission: EM | Admit: 2018-03-09 | Discharge: 2018-03-09 | Disposition: A | Discharge status: Home / Self Care | Payer: Self-pay | Attending: Emergency Medicine | Admitting: Emergency Medicine

## 2018-03-09 ENCOUNTER — Encounter (HOSPITAL_COMMUNITY): Payer: Self-pay | Admitting: *Deleted

## 2018-03-09 ENCOUNTER — Other Ambulatory Visit: Payer: Self-pay

## 2018-03-09 DIAGNOSIS — J4521 Mild intermittent asthma with (acute) exacerbation: Secondary | ICD-10-CM | POA: Insufficient documentation

## 2018-03-09 DIAGNOSIS — J45909 Unspecified asthma, uncomplicated: Secondary | ICD-10-CM | POA: Insufficient documentation

## 2018-03-09 DIAGNOSIS — Z79899 Other long term (current) drug therapy: Secondary | ICD-10-CM | POA: Insufficient documentation

## 2018-03-09 MED ORDER — IPRATROPIUM-ALBUTEROL 0.5-2.5 (3) MG/3ML IN SOLN
3.0000 mL | Freq: Once | RESPIRATORY_TRACT | Status: AC
  Administered 2018-03-09: 3 mL via RESPIRATORY_TRACT
  Filled 2018-03-09: qty 3

## 2018-03-09 MED ORDER — PREDNISONE 20 MG PO TABS
60.0000 mg | ORAL_TABLET | Freq: Once | ORAL | Status: AC
  Administered 2018-03-09: 60 mg via ORAL
  Filled 2018-03-09: qty 3

## 2018-03-09 MED ORDER — PREDNISONE 10 MG (21) PO TBPK: ORAL_TABLET | ORAL | 0 refills | Status: DC

## 2018-03-09 NOTE — ED Provider Notes (Signed)
[facility] EMERGENCY DEPARTMENT Provider Note   CSN: [id number] Arrival date & time: 03/09/18  1812    History   Chief Complaint Chief Complaint  Patient presents with  . Shortness of Breath    HPI Carol Marquez is a 28 y.o. female.     HPI   Carol Marquez is a 28 y.o. female, with a history of asthma, presenting to the ED with shortness of breath beginning earlier today.  States the weather changes like we have experienced recently will sometimes cause her to have an asthma exacerbation and her current symptoms feel very much like an asthma exacerbation.  She also complains of chest tightness and tenderness.  She has used her albuterol inhaler 3 times without improvement.  Denies fever/chills, recent illness, cough, lower extremity pain/edema, abdominal pain, nausea/vomiting, syncope, or any other complaints.      Past Medical History:  Diagnosis Date  . Asthma   . Endometriosis   . Fibromyalgia     There are no active problems to display for this patient.   History reviewed. No pertinent surgical history.   OB History   No obstetric history on file.      Home Medications    Prior to Admission medications   Medication Sig Start Date End Date Taking? Authorizing Provider  fluconazole (DIFLUCAN) 150 MG tablet Take 1 tablet (150 mg total) by mouth once a week. 09/27/17   Wallis Bamberg, PA-C  ibuprofen (ADVIL,MOTRIN) 200 MG tablet Take 600 mg by mouth every 4 (four) hours as needed for fever, headache, mild pain, moderate pain or cramping.    [provider]  ondansetron (ZOFRAN) 4 MG tablet Take 1 tablet (4 mg total) by mouth every 8 (eight) hours as needed for nausea or vomiting. 06/30/17   Caccavale, Sophia, PA-C  predniSONE (STERAPRED UNI-PAK 21 TAB) 10 MG (21) TBPK tablet Take 6 tabs (60mg ) day 1, 5 tabs (50mg ) day 2, 4 tabs (40mg ) day 3, 3 tabs (30mg ) day 4, 2 tabs (20mg ) day 5, and 1 tab (10mg ) day 6. 03/09/18   Margarite Vessel, Hillard Danker, PA-C     Family History Family History  Problem Relation Age of Onset  . Healthy Mother   . Healthy Father     Social History Social History   Tobacco Use  . Smoking status: Never Smoker  . Smokeless tobacco: Never Used  Substance Use Topics  . Alcohol use: No  . Drug use: No     Allergies   Codeine   Review of Systems Review of Systems  Constitutional: Negative for chills and fever.  Respiratory: Positive for shortness of breath. Negative for cough.   Cardiovascular: Negative for leg swelling.  Gastrointestinal: Negative for abdominal pain, diarrhea, nausea and vomiting.  Musculoskeletal: Negative for back pain.  Neurological: Negative for dizziness and syncope.  All other systems reviewed and are negative.    Physical Exam Updated Vital Signs BP 128/84 (BP Location: Right Arm)   Pulse 87   Temp 99 F (37.2 C) (Oral)   Resp 18   Ht 5\' 9"  (1.753 m)   LMP 02/23/2018   SpO2 100%   BMI 32.49 kg/m   Physical Exam Vitals signs and nursing note reviewed.  Constitutional:      General: She is not in acute distress.    Appearance: She is well-developed. She is not diaphoretic.  HENT:     Head: Normocephalic and atraumatic.     Mouth/Throat:     Mouth: Mucous membranes  are moist.     Pharynx: Oropharynx is clear.  Eyes:     Conjunctiva/sclera: Conjunctivae normal.  Neck:     Musculoskeletal: Neck supple.  Cardiovascular:     Rate and Rhythm: Normal rate and regular rhythm.     Pulses: Normal pulses.     Heart sounds: Normal heart sounds.     Comments: Tactile temperature in the extremities appropriate and equal bilaterally. Pulmonary:     Effort: Tachypnea present.     Breath sounds: Examination of the right-lower field reveals decreased breath sounds. Examination of the left-lower field reveals decreased breath sounds. Decreased breath sounds present.     Comments: Some tachypnea and mild increased work of breathing. Abdominal:     Palpations: Abdomen is  soft.     Tenderness: There is no abdominal tenderness. There is no guarding.  Musculoskeletal:     Right lower leg: No edema.     Left lower leg: No edema.  Lymphadenopathy:     Cervical: No cervical adenopathy.  Skin:    General: Skin is warm and dry.  Neurological:     Mental Status: She is alert.  Psychiatric:        Mood and Affect: Mood and affect normal.        Speech: Speech normal.        Behavior: Behavior normal.      ED Treatments / Results  Labs (all labs ordered are listed, but only abnormal results are displayed) Labs Reviewed - No data to display  EKG None  Radiology No results found.  Procedures Procedures (including critical care time)  Medications Ordered in ED Medications  ipratropium-albuterol (DUONEB) 0.5-2.5 (3) MG/3ML nebulizer solution 3 mL (3 mLs Nebulization Given 03/09/18 1843)  predniSONE (DELTASONE) tablet 60 mg (60 mg Oral Given 03/09/18 1842)     Initial Impression / Assessment and Plan / ED Course  I have reviewed the triage vital signs and the nursing notes.  Pertinent labs & imaging results that were available during my care of the patient were reviewed by me and considered in my medical decision making (see chart for details).  Clinical Course as of Mar 08 2305  Wynelle Link Mar 09, 2018  1918 Patient reassessed.  States her breathing feels as if it is back to normal.  Lung sounds are clear.   [SJ]    Clinical Course User Index [SJ] Renwick Asman C, PA-C       Patient presents with shortness of breath.  Suspect asthma exacerbation.  Improved back to baseline with DuoNeb treatment here in the ED. The patient was given instructions for home care as well as return precautions. Patient voices understanding of these instructions, accepts the plan, and is comfortable with discharge.  Final Clinical Impressions(s) / ED Diagnoses   Final diagnoses:  Mild intermittent asthma with exacerbation    ED Discharge Orders         Ordered     predniSONE (STERAPRED UNI-PAK 21 TAB) 10 MG (21) TBPK tablet     03/09/18 1920           Anselm Pancoast, PA-C 03/09/18 2307    Margarita Grizzle, MD 03/10/18 1428

## 2018-03-09 NOTE — Discharge Instructions (Signed)
Continue to use the albuterol inhaler, as needed. Take the prednisone until finished. Follow-up with your primary care provider on this matter. Return to the ED for worsening symptoms.

## 2018-03-09 NOTE — ED Triage Notes (Addendum)
Pt reports diff taking a breath while laying down today.  Sxs onset was 3 hours ago.  Hx of asthma.  She also endorses L cp, non-radiating, which she describes as dull cramping pain.  She also reports her lungs are "fluttering" which her family at bedside reports only asthma people have.

## 2018-05-23 ENCOUNTER — Other Ambulatory Visit: Payer: Self-pay

## 2018-05-23 ENCOUNTER — Encounter (HOSPITAL_COMMUNITY): Payer: Self-pay

## 2018-05-23 ENCOUNTER — Ambulatory Visit (HOSPITAL_COMMUNITY)
Admission: EM | Admit: 2018-05-23 | Discharge: 2018-05-23 | Disposition: A | Discharge status: Home / Self Care | Payer: Self-pay | Attending: Family Medicine | Admitting: Family Medicine

## 2018-05-23 DIAGNOSIS — Z113 Encounter for screening for infections with a predominantly sexual mode of transmission: Secondary | ICD-10-CM | POA: Insufficient documentation

## 2018-05-23 NOTE — ED Provider Notes (Signed)
[facility]    CSN: [id number] Arrival [id number] & time: 05/23/18  1019     History   Chief Complaint Chief Complaint  Patient presents with  . SEXUALLY TRANSMITTED DISEASE    HPI Carol Marquez Medico is a 28 y.o. female.   Carol Marquez Posch presents with requests for STD screen. States she has health anxiety and will feel overwhelming concern that she has an illness. Most recently is anxiety that she has AIDS. Denies any known exposures or symptoms. She states she has had screening in the past, last 09/2018, and it has been helpful for her to review her mychart to remind herself of her negative results. She has 1 partner, doesn't always use condoms. Recent yeast infection and still with small amount of white discharge. No vaginal bleeding. LMP 4/30. Hx of asthma, endometriosis, fibromyalgia.     ROS per HPI, negative if not otherwise mentioned.      Past Medical History:  Diagnosis Date  . Asthma   . Endometriosis   . Fibromyalgia     There are no active problems to display for this patient.   History reviewed. No pertinent surgical history.  OB History   No obstetric history on file.      Home Medications    Prior to Admission medications   Not on File    Family History Family History  Problem Relation Age of Onset  . Healthy Mother   . Healthy Father     Social History Social History   Tobacco Use  . Smoking status: Never Smoker  . Smokeless tobacco: Never Used  Substance Use Topics  . Alcohol use: No  . Drug use: No     Allergies   Codeine   Review of Systems Review of Systems   Physical Exam Triage Vital Signs ED Triage Vitals [05/23/18 1100]  Enc Vitals Group     BP 140/77     Pulse Rate 86     Resp 18     Temp 98.3 F (36.8 C)     Temp Source Oral     SpO2 99 %     Weight      Height      Head Circumference      Peak Flow      Pain Score 0     Pain Loc      Pain Edu?      Excl. in GC?    No data found.  Updated  Vital Signs BP 140/77 (BP Location: Left Arm)   Pulse 86   Temp 98.3 F (36.8 C) (Oral)   Resp 18   LMP 05/08/2018   SpO2 99%    Physical Exam Constitutional:      General: She is not in acute distress.    Appearance: She is well-developed.  Cardiovascular:     Rate and Rhythm: Normal rate and regular rhythm.     Heart sounds: Normal heart sounds.  Pulmonary:     Effort: Pulmonary effort is normal.     Breath sounds: Normal breath sounds.  Abdominal:     Palpations: Abdomen is soft. Abdomen is not rigid.     Tenderness: There is no abdominal tenderness. There is no guarding or rebound.  Genitourinary:    Comments: Denies sores, lesions, vaginal bleeding; no pelvic pain; gu exam deferred at this time, vaginal self swab collected.   Skin:    General: Skin is warm and dry.  Neurological:     Mental Status: She  is alert and oriented to person, place, and time.      UC Treatments / Results  Labs (all labs ordered are listed, but only abnormal results are displayed) Labs Reviewed  HIV ANTIBODY (ROUTINE TESTING W REFLEX)  CERVICOVAGINAL ANCILLARY ONLY    EKG None  Radiology No results found.  Procedures Procedures (including critical care time)  Medications Ordered in UC Medications - No data to display  Initial Impression / Assessment and Plan / UC Course  I have reviewed the triage vital signs and the nursing notes.  Pertinent labs & imaging results that were available during my care of the patient were reviewed by me and considered in my medical decision making (see chart for details).     Vaginal self-swab collected and pending. hiv screen pending. Will notify of any positive findings and if any changes to treatment are needed.    Final Clinical Impressions(s) / UC Diagnoses   Final diagnoses:  Screen for STD (sexually transmitted disease)     Discharge Instructions     Will notify you of any positive findings and if any changes to treatment are  needed.   You may monitor your results on your MyChart online as well.   Follow up with your primary care provider as need.    ED Prescriptions    None     Controlled Substance Prescriptions [place] Controlled Substance Registry consulted? Not Applicable   Georgetta Haber, NP 05/23/18 1129

## 2018-05-23 NOTE — Discharge Instructions (Signed)
Will notify you of any positive findings and if any changes to treatment are needed.   You may monitor your results on your MyChart online as well.   Follow up with your primary care provider as need.

## 2018-05-23 NOTE — ED Triage Notes (Signed)
Pt requesting STD panel. Denies any s/sx's, states has medical anxiety and wants to be checked.

## 2018-05-24 LAB — HIV ANTIBODY (ROUTINE TESTING W REFLEX): HIV Screen 4th Generation wRfx: NONREACTIVE

## 2018-05-26 ENCOUNTER — Telehealth (HOSPITAL_COMMUNITY): Payer: Self-pay | Admitting: Emergency Medicine

## 2018-05-26 LAB — CERVICOVAGINAL ANCILLARY ONLY
Bacterial vaginitis: POSITIVE — AB
Candida vaginitis: NEGATIVE
Chlamydia: POSITIVE — AB
Neisseria Gonorrhea: NEGATIVE
Trichomonas: NEGATIVE

## 2018-05-26 MED ORDER — METRONIDAZOLE 500 MG PO TABS: 500.0000 mg | ORAL_TABLET | Freq: Two times a day (BID) | ORAL | 0 refills | Status: AC

## 2018-05-26 MED ORDER — AZITHROMYCIN 250 MG PO TABS: 1000.0000 mg | ORAL_TABLET | Freq: Once | ORAL | 0 refills | Status: AC

## 2018-05-26 NOTE — Telephone Encounter (Signed)
Chlamydia is positive.  Rx po zithromax 1g #1 dose no refills was sent to the pharmacy of record.  Pt needs education to please refrain from sexual intercourse for 7 days to give the medicine time to work, sexual partners need to be notified and tested/treated.  Condoms may reduce risk of reinfection.  Recheck or followup with PCP for further evaluation if symptoms are not improving.   [facility] notified.  Bacterial vaginosis is positive. This was not treated at the urgent care visit.  Flagyl 500 mg BID x 7 days #14 no refills sent to patients pharmacy of choice.    Patient contacted and made aware of all results, all questions answered.

## 2018-07-04 ENCOUNTER — Telehealth: Payer: Self-pay | Admitting: Nurse Practitioner

## 2018-07-04 DIAGNOSIS — B3731 Acute candidiasis of vulva and vagina: Secondary | ICD-10-CM

## 2018-07-04 DIAGNOSIS — B373 Candidiasis of vulva and vagina: Secondary | ICD-10-CM

## 2018-07-04 MED ORDER — FLUCONAZOLE 150 MG PO TABS: 150.0000 mg | ORAL_TABLET | Freq: Once | ORAL | 0 refills | Status: AC

## 2018-07-04 NOTE — Progress Notes (Signed)

## 2018-08-22 ENCOUNTER — Other Ambulatory Visit: Payer: Self-pay

## 2018-08-22 ENCOUNTER — Emergency Department (HOSPITAL_COMMUNITY)
Admission: EM | Admit: 2018-08-22 | Discharge: 2018-08-22 | Disposition: A | Discharge status: Home / Self Care | Payer: Self-pay | Attending: Emergency Medicine | Admitting: Emergency Medicine

## 2018-08-22 ENCOUNTER — Emergency Department (HOSPITAL_COMMUNITY): Payer: Self-pay

## 2018-08-22 ENCOUNTER — Encounter (HOSPITAL_COMMUNITY): Payer: Self-pay | Admitting: *Deleted

## 2018-08-22 DIAGNOSIS — J45909 Unspecified asthma, uncomplicated: Secondary | ICD-10-CM | POA: Insufficient documentation

## 2018-08-22 DIAGNOSIS — K8020 Calculus of gallbladder without cholecystitis without obstruction: Secondary | ICD-10-CM | POA: Insufficient documentation

## 2018-08-22 DIAGNOSIS — R1011 Right upper quadrant pain: Secondary | ICD-10-CM

## 2018-08-22 LAB — COMPREHENSIVE METABOLIC PANEL
ALT: 20 U/L (ref 0–44)
AST: 23 U/L (ref 15–41)
Albumin: 4 g/dL (ref 3.5–5.0)
Alkaline Phosphatase: 40 U/L (ref 38–126)
Anion gap: 11 (ref 5–15)
BUN: 18 mg/dL (ref 6–20)
CO2: 21 mmol/L — ABNORMAL LOW (ref 22–32)
Calcium: 8.9 mg/dL (ref 8.9–10.3)
Chloride: 105 mmol/L (ref 98–111)
Creatinine, Ser: 0.88 mg/dL (ref 0.44–1.00)
GFR calc Af Amer: >60 mL/min (ref >60)
GFR calc non Af Amer: >60 mL/min (ref >60)
Glucose, Bld: 94 mg/dL (ref 70–99)
Potassium: 4.2 mmol/L (ref 3.5–5.1)
Sodium: 137 mmol/L (ref 135–145)
Total Bilirubin: 1.6 mg/dL — ABNORMAL HIGH (ref 0.3–1.2)
Total Protein: 7 g/dL (ref 6.5–8.1)

## 2018-08-22 LAB — CBC
HCT: 42.4 % (ref 36.0–46.0)
Hemoglobin: 14.3 g/dL (ref 12.0–15.0)
MCH: 30.5 pg (ref 26.0–34.0)
MCHC: 33.7 g/dL (ref 30.0–36.0)
MCV: 90.4 fL (ref 80.0–100.0)
Platelets: 242 10*3/uL (ref 150–400)
RBC: 4.69 MIL/uL (ref 3.87–5.11)
RDW: 11.8 % (ref 11.5–15.5)
WBC: 6.4 10*3/uL (ref 4.0–10.5)
nRBC: 0 % (ref 0.0–0.2)

## 2018-08-22 LAB — URINALYSIS, ROUTINE W REFLEX MICROSCOPIC
Bacteria, UA: NONE SEEN
Bilirubin Urine: NEGATIVE
Glucose, UA: NEGATIVE mg/dL
Hgb urine dipstick: NEGATIVE
Ketones, ur: NEGATIVE mg/dL
Nitrite: NEGATIVE
Protein, ur: 30 mg/dL — AB
Specific Gravity, Urine: 1.032 — ABNORMAL HIGH (ref 1.005–1.030)
pH: 5 (ref 5.0–8.0)

## 2018-08-22 LAB — I-STAT BETA HCG BLOOD, ED (MC, WL, AP ONLY): I-stat hCG, quantitative: <5 m[IU]/mL (ref <5)

## 2018-08-22 LAB — LIPASE, BLOOD: Lipase: 27 U/L (ref 11–51)

## 2018-08-22 MED ORDER — HYDROCODONE-ACETAMINOPHEN 5-325 MG PO TABS: 1.0000 | ORAL_TABLET | Freq: Four times a day (QID) | ORAL | 0 refills | Status: DC | PRN

## 2018-08-22 MED ORDER — SODIUM CHLORIDE 0.9% FLUSH: 3.0000 mL | Freq: Once | INTRAVENOUS | Status: DC

## 2018-08-22 MED ORDER — OXYCODONE-ACETAMINOPHEN 5-325 MG PO TABS
1.0000 | ORAL_TABLET | Freq: Once | ORAL | Status: AC
  Administered 2018-08-22: 1 via ORAL
  Filled 2018-08-22: qty 1

## 2018-08-22 MED ORDER — ONDANSETRON 4 MG PO TBDP
4.0000 mg | ORAL_TABLET | Freq: Once | ORAL | Status: AC
  Administered 2018-08-22: 4 mg via ORAL
  Filled 2018-08-22: qty 1

## 2018-08-22 MED ORDER — ONDANSETRON HCL 4 MG PO TABS: 4.0000 mg | ORAL_TABLET | Freq: Three times a day (TID) | ORAL | 0 refills | Status: DC | PRN

## 2018-08-22 MED ORDER — FENTANYL CITRATE (PF) 100 MCG/2ML IJ SOLN
50.0000 ug | Freq: Once | INTRAMUSCULAR | Status: DC
  Filled 2018-08-22: qty 2

## 2018-08-22 NOTE — ED Notes (Signed)
Patient transported to Ultrasound 

## 2018-08-22 NOTE — ED Triage Notes (Signed)
Pt reports hx of gallbladder disease and referred to surgeon but has not made appt yet. Has right side abd pain that radiates around to her back, became worse at 0300. Has nausea, no vomiting.

## 2018-08-22 NOTE — ED Notes (Signed)
Pt given ginger ale.

## 2018-08-22 NOTE — Discharge Instructions (Addendum)
It is important that you follow-up with general surgery for further evaluation of your frequent gallbladder attacks. Use Tylenol and ibuprofen as needed for mild to moderate pain. Use Norco as needed for severe breakthrough pain.  Have caution, this is a narcotic medicine. Do not drive or operate heavy machinery while taking this medicine. You Zofran as needed for nausea or vomiting. Return to the emergency room if you develop high fevers, persistent vomiting, severe worsening pain, or any new, worsening, or concerning symptoms.

## 2018-08-22 NOTE — ED Notes (Signed)
Pt states "drinking it slowly can tolerate, but drinking too fast pt begins to feel nauseous" notified Sophia(PA)

## 2018-08-22 NOTE — ED Provider Notes (Signed)
[facility] Provider Note   CSN: [id number] Arrival date & time: 08/22/18  0935     History   Chief Complaint Chief Complaint  Patient presents with  . Abdominal Pain    HPI Carol Marquez is a 28 y.o. female presenting for evaluation of abdominal pain.  Patient states she was awoken from sleep at 3:00 this morning with severe right upper quadrant abdominal pain that radiates to her back.  Pain is constant and severe.  She reports nausea without vomiting.  She denies fevers, chills, chest pain, shortness of breath, cough, lower abdominal pain, urinary symptoms, abnormal bowel movements.  Patient reports he has a history of gallstones, reports gallbladder attacks about 2/month.  She has not yet been able to follow-up with general surgery.  She reports no other history of abdominal problems.  Takes no medications daily.  She took Advil prior to arrival without improvement of her symptoms.  She has not taking anything else.     HPI  Past Medical History:  Diagnosis Date  . Asthma   . Endometriosis   . Fibromyalgia     There are no active problems to display for this patient.   History reviewed. No pertinent surgical history.   OB History   No obstetric history on file.      Home Medications    Prior to Admission medications   Medication Sig Start Date End Date Taking? Authorizing Provider  HYDROcodone-acetaminophen (NORCO/VICODIN) 5-325 MG tablet Take 1 tablet by mouth every 6 (six) hours as needed for severe pain. 08/22/18   Alashia Brownfield, PA-C  ondansetron (ZOFRAN) 4 MG tablet Take 1 tablet (4 mg total) by mouth every 8 (eight) hours as needed for nausea or vomiting. 08/22/18   Eurika Sandy, PA-C    Family History Family History  Problem Relation Age of Onset  . Healthy Mother   . Healthy Father     Social History Social History   Tobacco Use  . Smoking status: Never Smoker  . Smokeless tobacco: Never Used   Substance Use Topics  . Alcohol use: No  . Drug use: No     Allergies   Codeine   Review of Systems Review of Systems  Gastrointestinal: Positive for abdominal pain and nausea.  All other systems reviewed and are negative.    Physical Exam Updated Vital Signs BP 123/72 (BP Location: Right Arm)   Pulse 78   Temp 98.8 F (37.1 C) (Oral)   Resp 16   LMP 07/23/2018   SpO2 95%   Physical Exam Vitals signs and nursing note reviewed.  Constitutional:      General: She is not in acute distress.    Appearance: She is well-developed.     Comments: Appears nontoxic  HENT:     Head: Normocephalic and atraumatic.  Eyes:     Extraocular Movements: Extraocular movements intact.     Conjunctiva/sclera: Conjunctivae normal.     Pupils: Pupils are equal, round, and reactive to light.  Neck:     Musculoskeletal: Normal range of motion and neck supple.  Cardiovascular:     Rate and Rhythm: Normal rate and regular rhythm.     Pulses: Normal pulses.  Pulmonary:     Effort: Pulmonary effort is normal. No respiratory distress.     Breath sounds: Normal breath sounds. No wheezing.  Abdominal:     General: There is no distension.     Palpations: Abdomen is soft. There is no mass.  Tenderness: There is abdominal tenderness. There is no guarding or rebound.     Comments: Tenderness palpation of right upper quadrant abdomen.  No tenderness palpation elsewhere.  Positive Murphy's.  Negative rebound.  No signs of peritonitis.  No rigidity, guarding, distention.  Musculoskeletal: Normal range of motion.  Skin:    General: Skin is warm and dry.     Capillary Refill: Capillary refill takes less than 2 seconds.  Neurological:     Mental Status: She is alert and oriented to person, place, and time.      ED Treatments / Results  Labs (all labs ordered are listed, but only abnormal results are displayed) Labs Reviewed  COMPREHENSIVE METABOLIC PANEL - Abnormal; Notable for the  following components:      Result Value   CO2 21 (*)    Total Bilirubin 1.6 (*)    All other components within normal limits  URINALYSIS, ROUTINE W REFLEX MICROSCOPIC - Abnormal; Notable for the following components:   APPearance HAZY (*)    Specific Gravity, Urine 1.032 (*)    Protein, ur 30 (*)    Leukocytes,Ua SMALL (*)    All other components within normal limits  LIPASE, BLOOD  CBC  I-STAT BETA HCG BLOOD, ED ([facility], [facility], [facility] ONLY)    EKG None  Radiology [place] Abdomen Limited Ruq  Result Date: 08/22/2018 CLINICAL DATA:  Right upper quadrant abdominal pain since 3 a.m. today. Morbid obesity. EXAM: ULTRASOUND ABDOMEN LIMITED RIGHT UPPER QUADRANT COMPARISON:  06/30/2017. FINDINGS: Gallbladder: The previously demonstrated 2 cm gallstone in the gallbladder is again demonstrated, currently measuring 2.5 cm in maximum diameter. Mild gallbladder wall thickening with a maximum thickness of 4.3 mm. No pericholecystic fluid and no sonographic Murphy sign. Common bile duct: Diameter: 2.4 mm. Liver: No focal lesion identified. Within normal limits in parenchymal echogenicity. Portal vein is patent on color Doppler imaging with normal direction of blood flow towards the liver. Other: None. IMPRESSION: 1. Cholelithiasis. 2. Interval mild diffuse gallbladder wall thickening without other secondary signs of acute cholecystitis. This is most likely due to chronic cholecystitis. Early acute cholecystitis is also possibility. Electronically Signed   By: Beckie SaltsSteven  Reid M.D.   On: 08/22/2018 13:39    Procedures Procedures (including critical care time)  Medications Ordered in ED Medications  sodium chloride flush (NS) 0.9 % injection 3 mL (has no administration in time range)  fentaNYL (SUBLIMAZE) injection 50 mcg (50 mcg Intramuscular Not Given 08/22/18 1502)  ondansetron (ZOFRAN-ODT) disintegrating tablet 4 mg (4 mg Oral Given 08/22/18 1238)  oxyCODONE-acetaminophen (PERCOCET/ROXICET) 5-325 MG per tablet 1  tablet (1 tablet Oral Given 08/22/18 1238)     Initial Impression / Assessment and Plan / ED Course  I have reviewed the triage vital signs and the nursing notes.  Pertinent labs & imaging results that were available during my care of the patient were reviewed by me and considered in my medical decision making (see chart for details).        Patient sent in for evaluation of abdominal pain.  Physical examination, she appears nontoxic.  Tenderness palpation of right upper quadrant with positive Murphy's.  Patient with history of frequent gallbladder attacks, history is consistent with gallbladder etiology today.  As it is worse today than normal, obtain ultrasound to ensure no cholecystitis.  Labs are reassuring, no leukocytosis.  LFTs are normal.  Bili slightly elevated at 1.6.  Differential also includes PUD, GERD, pancreatitis.   Ultrasound shows cholelithiasis without obvious cholecystitis.  Shows  possible early cholecystitis, however as patient does not have fever or white count, low suspicion for infection at this time.  On reassessment, patient reports pain is improved with medications.  She is tolerating p.o.  Discussed continued symptomatic treatment at home and close follow-up with general surgery.  At this time, patient appears safe for discharge.  Return precautions given.  Patient states she understands and agrees to plan.  Final Clinical Impressions(s) / ED Diagnoses   Final diagnoses:  RUQ abdominal pain  Calculus of gallbladder without cholecystitis without obstruction    ED Discharge Orders         Ordered    HYDROcodone-acetaminophen (NORCO/VICODIN) 5-325 MG tablet  Every 6 hours PRN     08/22/18 1441    ondansetron (ZOFRAN) 4 MG tablet  Every 8 hours PRN     08/22/18 1441           Yobany Vroom, PA-C 08/22/18 1535    Maudie Flakes, MD 08/23/18 1620

## 2018-08-22 NOTE — ED Notes (Signed)
Patient verbalizes understanding of discharge instructions. Opportunity for questioning and answers were provided. Armband removed by staff, pt discharged from ED.  

## 2018-09-11 ENCOUNTER — Other Ambulatory Visit: Payer: Self-pay

## 2018-09-11 ENCOUNTER — Encounter: Payer: Self-pay | Admitting: Family Medicine

## 2018-09-11 ENCOUNTER — Ambulatory Visit (INDEPENDENT_AMBULATORY_CARE_PROVIDER_SITE_OTHER): Payer: Self-pay | Admitting: Family Medicine

## 2018-09-11 VITALS — BP 112/80 | HR 85 | Wt 224.2 lb

## 2018-09-11 DIAGNOSIS — Z6833 Body mass index (BMI) 33.0-33.9, adult: Secondary | ICD-10-CM

## 2018-09-11 DIAGNOSIS — J45909 Unspecified asthma, uncomplicated: Secondary | ICD-10-CM | POA: Insufficient documentation

## 2018-09-11 DIAGNOSIS — K811 Chronic cholecystitis: Secondary | ICD-10-CM | POA: Insufficient documentation

## 2018-09-11 DIAGNOSIS — M797 Fibromyalgia: Secondary | ICD-10-CM | POA: Insufficient documentation

## 2018-09-11 DIAGNOSIS — N809 Endometriosis, unspecified: Secondary | ICD-10-CM

## 2018-09-11 DIAGNOSIS — Z7689 Persons encountering health services in other specified circumstances: Secondary | ICD-10-CM | POA: Insufficient documentation

## 2018-09-11 MED ORDER — ALBUTEROL SULFATE HFA 108 (90 BASE) MCG/ACT IN AERS: 2.0000 | INHALATION_SPRAY | RESPIRATORY_TRACT | 1 refills | Status: DC | PRN

## 2018-09-11 NOTE — Patient Instructions (Addendum)
Wonderful to meet you!   1. Return for papsmear and STD screening   2. Gen surgery referral placed for your gallbladder, should hear from them within the next few weeks.    Exercise handout provided separately through [employer], link below for further information and resources. Share with your family and friends!   [email or web address]

## 2018-09-11 NOTE — Assessment & Plan Note (Signed)
Patient has been struggling with gallbladder concerns for the past 2 years, recently seen in the ED in 08/2018. RUQ ultrasound at that time showing mild diffuse gallbladder thickening without any additional sequela consistent with acute cholecystitis, suspected chronic changes.  Consistently has RUQ abdominal pain, negative Murphy sign on exam today.  Due to not having any insurance, she has been unable to see general surgery for consultation for removal. - Now with [employer] financial assistance, placed referral to general surgery within her network - Precautions to present to the ED discussed, including worsening RUQ pain, fever, N/V, AMS, jaundice

## 2018-09-11 NOTE — Assessment & Plan Note (Addendum)
Reviewed past medical, surgical, social, and family history with patient.  Discussed current medications and health maintenance requirements. - Due for Pap smear, will have patient return for this - Will perform STD screening at time of Pap smear per patient request, no concerning symptomatology present currently - At follow-up for above, will consider screening A1c due to elevated BMI - Recommend Tdap/flu shot at local pharmacy due to cost

## 2018-09-11 NOTE — Assessment & Plan Note (Signed)
Well-controlled.  Continue symptomatic management with oral contraceptives.

## 2018-09-11 NOTE — Progress Notes (Signed)
Subjective:    Patient ID: Carol Marquez, female    DOB: 02/24/1990, 28 y.o.   MRN: [id number]   CC: Establishing care  HPI: Carol Marquez is a 28 year old female presenting to establish care:  Care team:  Previous PCP: Isaias Cowman, last seen in 2018  Past medical history:  Endometriosis: Several year history. First menstrual cycle at age 75, abnormal pains at 63. Currently on BC pills for management, well controlled.   Fibromyalgia: diagnosed 2-3 years ago, by Dr. Lennice Sites. Random pains and burning sensations, went on for several years before diagnosed. Small sensations can make everywhere hurt. Mainly affects her back. Takes hot showers, heat pads. Previously tried muscle relaxer, helped some. Tried cymbalta, helped but not a fan of the side effects. Overall, comfortable with her current management.    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   Biliary colic/probable chronic cholecystitis: Seen recently in the ED on 8/14 for RUQ abdominal pain, felt to be likely chronic cholecystitis, referred to general surgery for further evaluation.  She continues to have pretty consistent RUQ pain, with or without eating now.  Denies any fever, nausea, vomiting.   Patient also endorses she cannot hear at low frequencies: since childhood. Previous evaluated by ENT, states her hearing is normal.   Asthma: Diagnosed as a child. Notices more frequently during season changes, or when its cold. Last exacerbation this past winter. On regular basis, no concerns with coughing or SOB.   Social history: Works at [employer] in, Forensic psychologist.  Lives with her mother and has 3 cats.  Lifelong non-smoker.  Denies any illicit drug or alcohol use.  Denies any concerns for being at risk for STDs, however previously was treated for chlamydia back in May.  Has not been sexually active since that time, however would like to have repeat STD screening just for peace of mind that the infection was  cleared.  She enjoys reading, martial arts, playing video games, and watching TV in her spare time.  Past surgical history: Wisdom teeth removal   Past family history: Significant for alcohol and drug abuse in her mother, father, and brothers at a young age, mother is prediabetic, family history of depression/anxiety  Current medications: Combined birth control pills, albuterol inhaler   Health maintenance: Due for HIV, Tdap, Pap smear, flu shot   Smoking status reviewed-non-smoker  Review of Systems Per HPI, also denies recent illness, fever, headache, changes in vision, chest pain, shortness of breath, N/V/D, weakness   Patient Active Problem List   Diagnosis Date Noted  . Fibromyalgia 09/11/2018  . Establishing care with new doctor, encounter for 09/11/2018  . Asthma 09/11/2018  . Chronic cholecystitis 09/11/2018  . Endometriosis 09/11/2018  . BMI 33.0-33.9,adult 09/11/2018     Objective:  BP 112/80   Pulse 85   Wt 224 lb 3.2 oz (101.7 kg)   SpO2 97%   BMI 33.11 kg/m  Vitals and nursing note reviewed  General: NAD, pleasant Cardiac: RRR, normal heart sounds, no murmurs Respiratory: CTAB, normal effort, no wheezing noted Abdomen: soft, tender to the right upper quadrant, negative McMurphy sign.  Nontender elsewhere, normoactive bowel sounds. Extremities: no edema or cyanosis. WWP. Skin: warm and dry, no rashes noted Neuro: alert and oriented, no focal deficits Psych: normal affect  Assessment & Plan:   Fibromyalgia Diagnosed by previous primary care provider, Dr. Lennice Sites, official diagnosis of the past 2-3 years.  Overall satisfied with her current management including heat, Tylenol/ibuprofen, activity  as needed.  Previously has tried Cymbalta and muscle relaxers, not interested in these currently. - Continue to monitor, will consider alternative therapies if requested - Counseled on maintaining consistent physical activity throughout her week and well-balanced diet   Establishing care with new doctor, encounter for Reviewed past medical, surgical, social, and family history with patient.  Discussed current medications and health maintenance requirements. - Due for Pap smear, will have patient return for this - Will perform STD screening at time of Pap smear per patient request, no concerning symptomatology present currently - At follow-up for above, will consider screening A1c due to elevated BMI - Recommend Tdap/flu shot at local pharmacy due to cost  Asthma Appears to be more seasonal and related to environmental allergies as she notes worsening during spring and winter.  Diagnosed in childhood.  Only has an expired albuterol inhaler at home.  Pulmonary exam unremarkable, unlabored breathing during evaluation. - Sent albuterol inhaler to pharmacy - Will likely obtain PFTs when available again in our clinic  Chronic cholecystitis Patient has been struggling with gallbladder concerns for the past 2 years, recently seen in the ED in 08/2018. RUQ ultrasound at that time showing mild diffuse gallbladder thickening without any additional sequela consistent with acute cholecystitis, suspected chronic changes.  Consistently has RUQ abdominal pain, negative Murphy sign on exam today.  Due to not having any insurance, she has been unable to see general surgery for consultation for removal. - Now with [facility] financial assistance, placed referral to general surgery within her network - Precautions to present to the ED discussed, including worsening RUQ pain, fever, N/V, AMS, jaundice  Endometriosis Well-controlled.  Continue symptomatic management with oral contraceptives.  BMI 33.0-33.9,adult Discussed strategies and ways to continue to stay physically active during the pandemic.  Additionally discussed working towards a well-balanced diet.  Handout provided. We will continue to discuss this on follow-up visits.   Follow-up within the next 3 weeks for Pap  smear and STD screening, or sooner if needed  Leticia PennaSamantha Reise Gladney DO Family Medicine Resident PGY-2

## 2018-09-11 NOTE — Assessment & Plan Note (Signed)
Appears to be more seasonal and related to environmental allergies as she notes worsening during spring and winter.  Diagnosed in childhood.  Only has an expired albuterol inhaler at home.  Pulmonary exam unremarkable, unlabored breathing during evaluation. - Sent albuterol inhaler to pharmacy - Will likely obtain PFTs when available again in our clinic

## 2018-09-11 NOTE — Assessment & Plan Note (Signed)
Diagnosed by previous primary care provider, Dr. Lennice Sites, official diagnosis of the past 2-3 years.  Overall satisfied with her current management including heat, Tylenol/ibuprofen, activity as needed.  Previously has tried Cymbalta and muscle relaxers, not interested in these currently. - Continue to monitor, will consider alternative therapies if requested - Counseled on maintaining consistent physical activity throughout her week and well-balanced diet

## 2018-09-11 NOTE — Assessment & Plan Note (Signed)
Discussed strategies and ways to continue to stay physically active during the pandemic.  Additionally discussed working towards a well-balanced diet.  Handout provided. We will continue to discuss this on follow-up visits.

## 2018-09-14 ENCOUNTER — Ambulatory Visit (HOSPITAL_COMMUNITY)
Admission: EM | Admit: 2018-09-14 | Discharge: 2018-09-14 | Disposition: A | Discharge status: Home / Self Care | Payer: Self-pay | Attending: Family Medicine | Admitting: Family Medicine

## 2018-09-14 ENCOUNTER — Encounter: Payer: Self-pay | Admitting: Family Medicine

## 2018-09-14 ENCOUNTER — Other Ambulatory Visit: Payer: Self-pay

## 2018-09-14 ENCOUNTER — Encounter (HOSPITAL_COMMUNITY): Payer: Self-pay

## 2018-09-14 DIAGNOSIS — Z7189 Other specified counseling: Secondary | ICD-10-CM | POA: Insufficient documentation

## 2018-09-14 DIAGNOSIS — Z20828 Contact with and (suspected) exposure to other viral communicable diseases: Secondary | ICD-10-CM | POA: Insufficient documentation

## 2018-09-14 DIAGNOSIS — Z1159 Encounter for screening for other viral diseases: Secondary | ICD-10-CM

## 2018-09-14 DIAGNOSIS — Z113 Encounter for screening for infections with a predominantly sexual mode of transmission: Secondary | ICD-10-CM | POA: Insufficient documentation

## 2018-09-14 NOTE — ED Triage Notes (Signed)
Pt presents for STD testing, states that she tested positive for Chlamydia in May and completed treatment. Denies any std symptoms at this time.  Patient is also requesting COVID testing. Reports that she has no symptoms but is going to see family and would like to be tested before traveling.

## 2018-09-14 NOTE — Discharge Instructions (Signed)
We will call with abnormal results.
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
## 2018-09-15 LAB — NOVEL CORONAVIRUS, NAA (HOSP ORDER, SEND-OUT TO REF LAB; TAT 18-24 HRS): SARS-CoV-2, NAA: NOT DETECTED

## 2018-09-15 LAB — RPR: RPR Ser Ql: NONREACTIVE

## 2018-09-17 LAB — HIV ANTIBODY (ROUTINE TESTING W REFLEX): HIV Screen 4th Generation wRfx: NONREACTIVE

## 2018-09-17 LAB — CERVICOVAGINAL ANCILLARY ONLY
Chlamydia: NEGATIVE
Neisseria Gonorrhea: NEGATIVE
Trichomonas: NEGATIVE

## 2018-09-24 ENCOUNTER — Ambulatory Visit (INDEPENDENT_AMBULATORY_CARE_PROVIDER_SITE_OTHER): Payer: Self-pay | Admitting: Family Medicine

## 2018-09-24 ENCOUNTER — Encounter: Payer: Self-pay | Admitting: Family Medicine

## 2018-09-24 ENCOUNTER — Other Ambulatory Visit (HOSPITAL_COMMUNITY)
Admission: RE | Admit: 2018-09-24 | Discharge: 2018-09-24 | Disposition: A | Discharge status: Home / Self Care | Payer: Self-pay | Source: Ambulatory Visit | Attending: Family Medicine | Admitting: Family Medicine

## 2018-09-24 ENCOUNTER — Other Ambulatory Visit: Payer: Self-pay

## 2018-09-24 VITALS — BP 106/82 | HR 59

## 2018-09-24 DIAGNOSIS — F951 Chronic motor or vocal tic disorder: Secondary | ICD-10-CM

## 2018-09-24 DIAGNOSIS — F329 Major depressive disorder, single episode, unspecified: Secondary | ICD-10-CM

## 2018-09-24 DIAGNOSIS — Z124 Encounter for screening for malignant neoplasm of cervix: Secondary | ICD-10-CM

## 2018-09-24 DIAGNOSIS — F32A Depression, unspecified: Secondary | ICD-10-CM

## 2018-09-24 DIAGNOSIS — F321 Major depressive disorder, single episode, moderate: Secondary | ICD-10-CM

## 2018-09-24 DIAGNOSIS — Z0000 Encounter for general adult medical examination without abnormal findings: Secondary | ICD-10-CM

## 2018-09-24 MED ORDER — SERTRALINE HCL 50 MG PO TABS: 50.0000 mg | ORAL_TABLET | Freq: Every day | ORAL | 0 refills | Status: DC
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
## 2018-09-24 NOTE — Patient Instructions (Signed)
It was wonderful seeing you again today.  I will let you know the results of your Pap smear within the next week.  Additionally, we have started Zoloft today for which you can take 50 mg after the first 2 days to 25 mg.  Below are some wonderful therapy resources to help with your depression and tics.  Please let me know if your tics become more frequent, are bothersome, if you have any weakness/numbness.  If you have any thoughts of hurting yourself or others--you need to seek care immediately through the ED and you may also call the crisis hotline.    24- Hour Availability:  *[facility] [phone] or [phone] * [facility] (Domestic Violence, Rape, etc. )[phone] [facility] [phone] or [phone] * [facility] [phone] only) [phone] (after hours) *[facility] [phone] *[place] National Suicide Hotline [phone] Diamantina Monks)

## 2018-09-25 ENCOUNTER — Encounter: Payer: Self-pay | Admitting: Family Medicine

## 2018-09-25 DIAGNOSIS — F951 Chronic motor or vocal tic disorder: Secondary | ICD-10-CM | POA: Insufficient documentation

## 2018-09-25 DIAGNOSIS — Z0000 Encounter for general adult medical examination without abnormal findings: Secondary | ICD-10-CM | POA: Insufficient documentation

## 2018-09-25 DIAGNOSIS — F321 Major depressive disorder, single episode, moderate: Secondary | ICD-10-CM | POA: Insufficient documentation
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
## 2018-09-25 NOTE — Assessment & Plan Note (Signed)
Present since age 28.  Overall not bothersome to the patient, however provided reassurance and validation.  Suspect primary tic disorder, reassuringly neurologically intact on exam.  Consider Tourette's, however given that she is able to suppress her tics and they minimally occur without any additional associated vocal tics, believe this is less likely however will remain on differential.  As patient endorses decreased mood as a trigger, will work towards managing depression as discussed below.

## 2018-09-25 NOTE — Assessment & Plan Note (Signed)
Performed Pap smear today.  Pelvic exam unremarkable. - Follow-up cytology

## 2018-09-29 LAB — CYTOLOGY - PAP: Diagnosis: NEGATIVE

## 2018-09-30 ENCOUNTER — Encounter: Payer: Self-pay | Admitting: Family Medicine

## 2018-10-06 ENCOUNTER — Other Ambulatory Visit: Payer: Self-pay

## 2018-10-06 ENCOUNTER — Emergency Department (HOSPITAL_COMMUNITY): Payer: Self-pay

## 2018-10-06 ENCOUNTER — Encounter (HOSPITAL_COMMUNITY): Payer: Self-pay

## 2018-10-06 ENCOUNTER — Emergency Department (HOSPITAL_COMMUNITY)
Admission: EM | Admit: 2018-10-06 | Discharge: 2018-10-06 | Disposition: A | Discharge status: Home / Self Care | Payer: Self-pay | Attending: Emergency Medicine | Admitting: Emergency Medicine

## 2018-10-06 DIAGNOSIS — J45909 Unspecified asthma, uncomplicated: Secondary | ICD-10-CM | POA: Insufficient documentation

## 2018-10-06 DIAGNOSIS — R002 Palpitations: Secondary | ICD-10-CM | POA: Insufficient documentation

## 2018-10-06 DIAGNOSIS — Z885 Allergy status to narcotic agent status: Secondary | ICD-10-CM | POA: Insufficient documentation

## 2018-10-06 DIAGNOSIS — Z79899 Other long term (current) drug therapy: Secondary | ICD-10-CM | POA: Insufficient documentation

## 2018-10-06 DIAGNOSIS — R0789 Other chest pain: Secondary | ICD-10-CM | POA: Insufficient documentation

## 2018-10-06 DIAGNOSIS — M797 Fibromyalgia: Secondary | ICD-10-CM | POA: Insufficient documentation

## 2018-10-06 LAB — CBC
HCT: 41 % (ref 36.0–46.0)
Hemoglobin: 14.2 g/dL (ref 12.0–15.0)
MCH: 31.2 pg (ref 26.0–34.0)
MCHC: 34.6 g/dL (ref 30.0–36.0)
MCV: 90.1 fL (ref 80.0–100.0)
Platelets: 263 10*3/uL (ref 150–400)
RBC: 4.55 MIL/uL (ref 3.87–5.11)
RDW: 11.8 % (ref 11.5–15.5)
WBC: 5.9 10*3/uL (ref 4.0–10.5)
nRBC: 0 % (ref 0.0–0.2)

## 2018-10-06 LAB — BASIC METABOLIC PANEL
Anion gap: 8 (ref 5–15)
BUN: 10 mg/dL (ref 6–20)
CO2: 24 mmol/L (ref 22–32)
Calcium: 9.3 mg/dL (ref 8.9–10.3)
Chloride: 105 mmol/L (ref 98–111)
Creatinine, Ser: 0.91 mg/dL (ref 0.44–1.00)
GFR calc Af Amer: >60 mL/min (ref >60)
GFR calc non Af Amer: >60 mL/min (ref >60)
Glucose, Bld: 95 mg/dL (ref 70–99)
Potassium: 4.2 mmol/L (ref 3.5–5.1)
Sodium: 137 mmol/L (ref 135–145)

## 2018-10-06 LAB — TROPONIN I (HIGH SENSITIVITY): Troponin I (High Sensitivity): 3 ng/L (ref <18)

## 2018-10-06 LAB — I-STAT BETA HCG BLOOD, ED (MC, WL, AP ONLY): I-stat hCG, quantitative: <5 m[IU]/mL (ref <5)

## 2018-10-06 MED ORDER — SODIUM CHLORIDE 0.9% FLUSH: 3.0000 mL | Freq: Once | INTRAVENOUS | Status: DC

## 2018-10-06 NOTE — Discharge Instructions (Addendum)
Please read attached information. If you experience any new or worsening signs or symptoms please return to the emergency room for evaluation. Please follow-up with your primary care provider or specialist as discussed.  °

## 2018-10-06 NOTE — ED Triage Notes (Signed)
Pt c.o chest pain and palpitations since 7am this morning, some nausea. Denies any other symptoms.

## 2018-10-06 NOTE — ED Provider Notes (Signed)
[facility] Provider Note   CSN: [id number] Arrival date & time: 10/06/18  1205     History   Chief Complaint Chief Complaint  Patient presents with  . Chest Pain  . Palpitations    HPI Carol Marquez is a 28 y.o. female.     HPI   28 year old female presents today with complaints of palpitations.  She notes this morning she had the sensation that her heart was beating abnormally hard.  She felt this up into her throat.  She denies any associated shortness of breath.  She does note tightness in her chest.  Patient denies any personal cardiac history any new medications other than her antidepressant medication which she has taken previously.  She denies any history DVT or PE or any significant risk factors for the same.  No lower extremity swelling or edema.  She is not having the symptoms presently.  No family cardiac history.  Past Medical History:  Diagnosis Date  . Asthma   . Endometriosis   . Fibromyalgia     Patient Active Problem List   Diagnosis Date Noted  . Health care maintenance 09/25/2018  . Chronic motor tic disorder 09/25/2018  . 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 Fibromyalgia 09/11/2018  . Establishing care with new doctor, encounter for 09/11/2018  . Asthma 09/11/2018  . Chronic cholecystitis 09/11/2018  . Endometriosis 09/11/2018  . BMI 33.0-33.9,adult 09/11/2018    No past surgical history on file.   OB History   No obstetric history on file.      Home Medications    Prior to Admission medications   Medication Sig Start Date End Date Taking? Authorizing Provider  albuterol (VENTOLIN HFA) 108 (90 Base) MCG/ACT inhaler Inhale 2 puffs into the lungs every 4 (four) hours as needed for wheezing or shortness of breath. 09/11/18   Allayne Stack, DO  HYDROcodone-acetaminophen (NORCO/VICODIN) 5-325 MG tablet Take 1 tablet by mouth every 6 (six) hours as needed for severe pain. 08/22/18   Caccavale,  Sophia, PA-C  ondansetron (ZOFRAN) 4 MG tablet Take 1 tablet (4 mg total) by mouth every 8 (eight) hours as needed for nausea or vomiting. 08/22/18   Caccavale, Sophia, PA-C  sertraline (ZOLOFT) 50 MG tablet Take 1 tablet (50 mg total) by mouth daily. Start with 25mg  for the first few days. 09/24/18   09/26/18, DO    Family History Family History  Problem Relation Age of Onset  . Healthy Mother   . Healthy Father     Social History Social History   Tobacco Use  . Smoking status: Never Smoker  . Smokeless tobacco: Never Used  Substance Use Topics  . Alcohol use: No  . Drug use: No     Allergies   Codeine   Review of Systems Review of Systems  All other systems reviewed and are negative.   Physical Exam Updated Vital Signs BP 134/74 (BP Location: Right Arm)   Pulse 64   Temp 99.3 F (37.4 C) (Oral)   Resp 16   LMP 10/02/2018   SpO2 100%   Physical Exam Vitals signs and nursing note reviewed.  Constitutional:      Appearance: She is well-developed.  HENT:     Head: Normocephalic and atraumatic.  Eyes:     General: No scleral icterus.       Right eye: No discharge.        Left eye: No discharge.     Conjunctiva/sclera:  Conjunctivae normal.     Pupils: Pupils are equal, round, and reactive to light.  Neck:     Musculoskeletal: Normal range of motion.     Vascular: No JVD.     Trachea: No tracheal deviation.  Cardiovascular:     Rate and Rhythm: Normal rate and regular rhythm.  Pulmonary:     Effort: Pulmonary effort is normal. No respiratory distress.     Breath sounds: Normal breath sounds. No stridor. No wheezing or rhonchi.  Neurological:     Mental Status: She is alert and oriented to person, place, and time.     Coordination: Coordination normal.  Psychiatric:        Behavior: Behavior normal.        Thought Content: Thought content normal.        Judgment: Judgment normal.      ED Treatments / Results  Labs (all labs ordered are  listed, but only abnormal results are displayed) Labs Reviewed  BASIC METABOLIC PANEL  CBC  I-STAT BETA HCG BLOOD, ED ([place], [facility], [facility] ONLY)  TROPONIN I (HIGH SENSITIVITY)  TROPONIN I (HIGH SENSITIVITY)    EKG EKG Interpretation  Date/Time:  Monday October 06 2018 12:08:46 EDT Ventricular Rate:  64 PR Interval:  162 QRS Duration: 92 QT Interval:  440 QTC Calculation: 453 R Axis:   47 Text Interpretation:  Normal sinus rhythm Normal ECG No significant change since last tracing Confirmed by Davonna Belling [phone]) on 10/06/2018 2:19:46 PM   Radiology Dg Chest 2 View  Result Date: 10/06/2018 CLINICAL DATA:  Chest pain EXAM: CHEST - 2 VIEW COMPARISON:  11/01/2015 FINDINGS: The heart size and mediastinal contours are within normal limits. Both lungs are clear. The visualized skeletal structures are unremarkable. IMPRESSION: Unremarkable chest radiographs. Electronically Signed   By: Carol Marquez M.D.   On: 10/06/2018 12:45    Procedures Procedures (including critical care time)  Medications Ordered in ED Medications  sodium chloride flush (NS) 0.9 % injection 3 mL (3 mLs Intravenous Not Given 10/06/18 1413)     Initial Impression / Assessment and Plan / ED Course  I have reviewed the triage vital signs and the nursing notes.  Pertinent labs & imaging results that were available during my care of the patient were reviewed by me and considered in my medical decision making (see chart for details).        Labs:   Imaging:  Consults:  Therapeutics:  Discharge Meds:   Assessment/Plan: [name] here with palpitations.  I have very low suspicion for any significant arrhythmia, no signs of ACS PE dissection or other life-threatening etiology.  Patient stable for outpatient follow-up return precautions given.  She verbalized understanding and agreement to today's plan had no further questions or concerns at time of discharge.   Final Clinical Impressions(s) / ED Diagnoses    Final diagnoses:  Palpitations    ED Discharge Orders    None       Carol Marquez 10/06/18 1421    Davonna Belling, MD 10/06/18 1513

## 2018-10-09 ENCOUNTER — Ambulatory Visit (INDEPENDENT_AMBULATORY_CARE_PROVIDER_SITE_OTHER): Payer: Self-pay | Admitting: Family Medicine

## 2018-10-09 ENCOUNTER — Other Ambulatory Visit: Payer: Self-pay

## 2018-10-09 ENCOUNTER — Ambulatory Visit: Payer: Self-pay | Admitting: Family Medicine

## 2018-10-09 VITALS — BP 128/80 | HR 83

## 2018-10-09 DIAGNOSIS — F321 Major depressive disorder, single episode, moderate: Secondary | ICD-10-CM

## 2018-10-09 NOTE — Patient Instructions (Signed)
It was great to meet you today! Thank you for letting me participate in your care!  Today, we discussed your depression and I am glad you are responding well to the medication. Please continue to take it as prescribed and return in 2 months for a follow up. If you have concerns about how you are feeling or side effects from the medication please contact us sooner.  Your ED work up for chest pain and heart palpitations is very reassuring. Please try to find ways to decrease stress and to manage it in healthy ways. Follow up with Psychiatry and let us know if it continues.  Be well, [name], DO PGY-3, [facility]

## 2018-10-09 NOTE — Progress Notes (Signed)
     Subjective: Chief Complaint  Patient presents with  . Follow-up     HPI: Carol Marquez is a 28 y.o. presenting to clinic today to discuss the following:  Depression Follow Up Patient presents for a 4 week follow up for depression at which she was started on Zoloft 50mg  daily by Dr. Higinio Plan. She reports she is doing much better today and thinks the medication has helped her a great deal. She feels like she can do normal daily activities and does not feel overwhelmed. She also states "I can cry when I need to" but feels like it doesn't take over like it did before. She denies any SI or HI to herself today and no intent to hurt others.  Patient recently seen in the ED due to heart palpitations and pain. Work up in ED completely normal. No longer having these symptoms today but will follow up if they reoccur.  PHQ-9 score of 7 down from 18 at previous visit.     ROS noted in HPI.    Social History   Tobacco Use  Smoking Status Never Smoker  Smokeless Tobacco Never Used    Objective: BP 128/80   Pulse 83   LMP 10/02/2018   SpO2 98%  Vitals and nursing notes reviewed  Physical Exam Gen: Alert and Oriented x 3, NAD HEENT: Normocephalic, atraumatic CV: RRR, no murmurs, normal S1, S2 split Resp: CTAB, no wheezing, rales, or rhonchi, comfortable work of breathing Skin: warm, dry, intact, no rashes  Assessment/Plan:  Moderate major depression (HCC) Depression seems to be well controlled on 50mg  Zoloft. - Cont current regimen, follow up in 2 months if all is going well. - F/u sooner if she has recurrent uncontrolled symptoms of depression.  PATIENT EDUCATION PROVIDED: See AVS    Diagnosis and plan along with any newly prescribed medication(s) were discussed in detail with this patient today. The patient verbalized understanding and agreed with the plan. Patient advised if symptoms worsen return to clinic or ER.    Carol Rutherford, DO 10/09/2018, 1:40 PM PGY-3 [place]

## 2018-10-09 NOTE — Assessment & Plan Note (Signed)
Depression seems to be well controlled on 50mg  Zoloft. - Cont current regimen, follow up in 2 months if all is going well. - F/u sooner if she has recurrent uncontrolled symptoms of depression.

## 2018-10-23 ENCOUNTER — Encounter: Payer: Self-pay | Admitting: Family Medicine

## 2018-10-23 DIAGNOSIS — F32A Depression, unspecified: Secondary | ICD-10-CM

## 2018-10-23 DIAGNOSIS — F329 Major depressive disorder, single episode, unspecified: Secondary | ICD-10-CM

## 2018-10-24 MED ORDER — SERTRALINE HCL 50 MG PO TABS: 50.0000 mg | ORAL_TABLET | Freq: Every day | ORAL | 1 refills | Status: DC

## 2018-11-23 ENCOUNTER — Encounter: Payer: Self-pay | Admitting: Family Medicine

## 2018-11-24 ENCOUNTER — Other Ambulatory Visit: Payer: Self-pay | Admitting: Family Medicine

## 2018-11-24 DIAGNOSIS — Z3040 Encounter for surveillance of contraceptives, unspecified: Secondary | ICD-10-CM

## 2018-11-24 MED ORDER — DROSPIRENONE-ETHINYL ESTRADIOL 3-0.02 MG PO TABS: 1.0000 | ORAL_TABLET | Freq: Every day | ORAL | 11 refills | Status: DC

## 2018-12-18 ENCOUNTER — Encounter (HOSPITAL_COMMUNITY): Payer: Self-pay | Admitting: Emergency Medicine

## 2018-12-18 ENCOUNTER — Emergency Department (HOSPITAL_COMMUNITY)
Admission: EM | Admit: 2018-12-18 | Discharge: 2018-12-18 | Disposition: A | Discharge status: Home / Self Care | Payer: Self-pay | Attending: Emergency Medicine | Admitting: Emergency Medicine

## 2018-12-18 ENCOUNTER — Other Ambulatory Visit: Payer: Self-pay

## 2018-12-18 ENCOUNTER — Emergency Department (HOSPITAL_COMMUNITY): Payer: Self-pay

## 2018-12-18 DIAGNOSIS — R110 Nausea: Secondary | ICD-10-CM | POA: Insufficient documentation

## 2018-12-18 DIAGNOSIS — K8050 Calculus of bile duct without cholangitis or cholecystitis without obstruction: Secondary | ICD-10-CM | POA: Insufficient documentation

## 2018-12-18 DIAGNOSIS — Z79899 Other long term (current) drug therapy: Secondary | ICD-10-CM | POA: Insufficient documentation

## 2018-12-18 DIAGNOSIS — R1030 Lower abdominal pain, unspecified: Secondary | ICD-10-CM | POA: Insufficient documentation

## 2018-12-18 DIAGNOSIS — J45909 Unspecified asthma, uncomplicated: Secondary | ICD-10-CM | POA: Insufficient documentation

## 2018-12-18 DIAGNOSIS — R1011 Right upper quadrant pain: Secondary | ICD-10-CM

## 2018-12-18 DIAGNOSIS — K8020 Calculus of gallbladder without cholecystitis without obstruction: Secondary | ICD-10-CM | POA: Insufficient documentation

## 2018-12-18 LAB — CBC
HCT: 42.7 % (ref 36.0–46.0)
Hemoglobin: 14.7 g/dL (ref 12.0–15.0)
MCH: 30.8 pg (ref 26.0–34.0)
MCHC: 34.4 g/dL (ref 30.0–36.0)
MCV: 89.3 fL (ref 80.0–100.0)
Platelets: 294 10*3/uL (ref 150–400)
RBC: 4.78 MIL/uL (ref 3.87–5.11)
RDW: 11.8 % (ref 11.5–15.5)
WBC: 8.2 10*3/uL (ref 4.0–10.5)
nRBC: 0 % (ref 0.0–0.2)

## 2018-12-18 LAB — COMPREHENSIVE METABOLIC PANEL
ALT: 13 U/L (ref 0–44)
AST: 15 U/L (ref 15–41)
Albumin: 4 g/dL (ref 3.5–5.0)
Alkaline Phosphatase: 47 U/L (ref 38–126)
Anion gap: 9 (ref 5–15)
BUN: 15 mg/dL (ref 6–20)
CO2: 26 mmol/L (ref 22–32)
Calcium: 9.4 mg/dL (ref 8.9–10.3)
Chloride: 105 mmol/L (ref 98–111)
Creatinine, Ser: 0.88 mg/dL (ref 0.44–1.00)
GFR calc Af Amer: >60 mL/min (ref >60)
GFR calc non Af Amer: >60 mL/min (ref >60)
Glucose, Bld: 111 mg/dL — ABNORMAL HIGH (ref 70–99)
Potassium: 4.2 mmol/L (ref 3.5–5.1)
Sodium: 140 mmol/L (ref 135–145)
Total Bilirubin: 0.2 mg/dL — ABNORMAL LOW (ref 0.3–1.2)
Total Protein: 7.5 g/dL (ref 6.5–8.1)

## 2018-12-18 LAB — URINALYSIS, ROUTINE W REFLEX MICROSCOPIC
Bilirubin Urine: NEGATIVE
Glucose, UA: NEGATIVE mg/dL
Hgb urine dipstick: NEGATIVE
Ketones, ur: NEGATIVE mg/dL
Leukocytes,Ua: NEGATIVE
Nitrite: NEGATIVE
Protein, ur: NEGATIVE mg/dL
Specific Gravity, Urine: 1.027 (ref 1.005–1.030)
pH: 5 (ref 5.0–8.0)

## 2018-12-18 LAB — I-STAT BETA HCG BLOOD, ED (MC, WL, AP ONLY): I-stat hCG, quantitative: <5 m[IU]/mL (ref <5)

## 2018-12-18 LAB — LIPASE, BLOOD: Lipase: 34 U/L (ref 11–51)

## 2018-12-18 MED ORDER — OXYCODONE-ACETAMINOPHEN 5-325 MG PO TABS: 1.0000 | ORAL_TABLET | Freq: Four times a day (QID) | ORAL | 0 refills | Status: DC | PRN

## 2018-12-18 MED ORDER — SODIUM CHLORIDE 0.9 % IV BOLUS
1000.0000 mL | Freq: Once | INTRAVENOUS | Status: AC
  Administered 2018-12-18: 1000 mL via INTRAVENOUS

## 2018-12-18 MED ORDER — MORPHINE SULFATE (PF) 4 MG/ML IV SOLN
4.0000 mg | Freq: Once | INTRAVENOUS | Status: AC
  Administered 2018-12-18: 4 mg via INTRAVENOUS
  Filled 2018-12-18: qty 1

## 2018-12-18 MED ORDER — ONDANSETRON 4 MG PO TBDP: ORAL_TABLET | ORAL | 0 refills | Status: DC

## 2018-12-18 MED ORDER — SODIUM CHLORIDE 0.9% FLUSH
3.0000 mL | Freq: Once | INTRAVENOUS | Status: AC
  Administered 2018-12-18: 3 mL via INTRAVENOUS

## 2018-12-18 MED ORDER — ONDANSETRON HCL 4 MG/2ML IJ SOLN
4.0000 mg | Freq: Once | INTRAMUSCULAR | Status: AC
  Administered 2018-12-18: 4 mg via INTRAVENOUS
  Filled 2018-12-18: qty 2

## 2018-12-18 MED ORDER — METOCLOPRAMIDE HCL 5 MG/ML IJ SOLN
10.0000 mg | Freq: Once | INTRAMUSCULAR | Status: AC
  Administered 2018-12-18: 10 mg via INTRAVENOUS
  Filled 2018-12-18: qty 2

## 2018-12-18 NOTE — ED Notes (Signed)
C/o abd. Pain onset 9pm c/o nausea no vomiting. Denies urinarry problems. No diarrhea , last bm earlier yest of which was normal. lsm 11/15. Pain is across her lower abd into right flank describes as burning sensation.

## 2018-12-18 NOTE — Discharge Instructions (Signed)
Please follow the gallbladder eating plan provided on your paperwork today, avoid any fat in your meals as this can trigger gallbladder spasm and pain.  Use Zofran for nausea and prescribed Percocet as needed for pain.  Return if you develop fevers, persistent vomiting or significantly worsened or new pain.  You have been scheduled to see Dr. Brantley Stage with general surgery in the office on Monday, December 14 at 38 AM for preop evaluation so they can schedule you for outpatient removal of your gallbladder.

## 2018-12-18 NOTE — Consult Note (Signed)
[facility] Consult  Note  Carol Marquez 1990-07-09  [id number].    Requesting MD: Meridee Score  Chief Complaint: Abdominal pain Reason for Consult: Symptomatic cholelithiasis  HPI:  Patient is a 28 year old female who presents with abdominal pain that started yesterday.  She has a history of cholelithiasis dating back to 06/30/17.  She reports pain occurred intermittently over the past 2 years, but is becoming more frequent now.  Her discomfort started this a.m. and was enough to bring her into the hospital.  She has received 2 doses of morphine Reglan and Zofran while here in the hospital.  Currently her pain is better after the medications.  Work-up in the ED: She is afebrile vital signs are stable.  CMP is normal except for a glucose of 111 and a total bilirubin of 0.2.  WBC 8.2, hemoglobin 14.7, hematocrit 42.7 platelets are 294,000.  Abdominal ultrasound shows no gallbladder wall thickening, gallstones, common bile duct was 5 mm.  Otherwise unremarkable ultrasound.  ROS: Review of Systems  Constitutional: Positive for weight loss (on a diet).  HENT: Negative.   Eyes: Negative.   Respiratory: Negative.   Cardiovascular: Positive for palpitations (Hx palpitations with anxiety.). Negative for chest pain, orthopnea, claudication, leg swelling and PND.  Gastrointestinal: Positive for abdominal pain (Right upper quadrant), heartburn (Occasional with stress.) and nausea (With symptoms.). Negative for blood in stool, constipation, diarrhea, melena and vomiting.  Genitourinary: Negative.   Musculoskeletal: Negative.   Skin: Negative.   Neurological: Negative.   Endo/Heme/Allergies: Negative.   Psychiatric/Behavioral:       Patient reports her anxiety and depression are well controlled.    Family History  Problem Relation Age of Onset  . Healthy Mother   . Healthy Father     Past Medical History:  Diagnosis Date  .  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 Endometriosis   . Fibromyalgia     History reviewed. No pertinent surgical history.  Social History:  reports that she has never smoked. She has never used smokeless tobacco. She reports that she does not drink alcohol or use drugs. Tobacco: None Drugs: None EtOH: None She works at a hotel front desk She lives with her mother. Allergies:  Allergies  Allergen Reactions  . Codeine Nausea Only    Prior to Admission medications   Medication Sig Start Date End Date Taking? Authorizing Provider  albuterol (VENTOLIN HFA) 108 (90 Base) MCG/ACT inhaler Inhale 2 puffs into the lungs every 4 (four) hours as needed for wheezing or shortness of breath. 09/11/18  Yes Allayne Stack, DO  drospirenone-ethinyl estradiol (YAZ) 3-0.02 MG tablet Take 1 tablet by mouth daily. 11/24/18  Yes Beard, Samantha N, DO  sertraline (ZOLOFT) 50 MG tablet Take 1 tablet (50 mg total) by mouth daily. 10/24/18  Yes Allayne Stack, DO  HYDROcodone-acetaminophen (NORCO/VICODIN) 5-325 MG tablet Take 1 tablet by mouth every 6 (six) hours as needed for severe pain. Patient not taking: Reported on 12/18/2018 08/22/18   Caccavale, Sophia, PA-C  ondansetron (ZOFRAN) 4 MG tablet Take 1 tablet (4 mg total) by mouth every 8 (eight) hours as needed for nausea or vomiting. Patient not taking: Reported on 12/18/2018 08/22/18   Caccavale, Sophia, PA-C     Blood pressure 130/76, pulse 78, temperature 98.4 F (36.9 C), temperature source Oral, resp. rate 16, last menstrual period 11/23/2018, SpO2 100 %. Physical Exam: Physical Exam Constitutional:      General: She  is not in acute distress.    Appearance: She is well-developed. She is not ill-appearing, toxic-appearing or diaphoretic.  HENT:     Head: Normocephalic and atraumatic.     Mouth/Throat:     Pharynx: Oropharynx is clear.  Eyes:     General: No scleral icterus.    Comments: Pupils are equal  Cardiovascular:      Rate and Rhythm: Normal rate and regular rhythm.     Heart sounds: Normal heart sounds.  Pulmonary:     Effort: Pulmonary effort is normal.     Breath sounds: Normal breath sounds.  Abdominal:     General: Abdomen is flat. Bowel sounds are normal. There is no distension.     Palpations: Abdomen is soft.     Tenderness: There is abdominal tenderness in the right upper quadrant.     Hernia: No hernia is present.     Comments: Minimal tenderness on palpation right upper quadrant.  Skin:    General: Skin is warm and dry.     Capillary Refill: Capillary refill takes less than 2 seconds.  Neurological:     General: No focal deficit present.     Mental Status: She is alert and oriented to person, place, and time.     Cranial Nerves: No cranial nerve deficit.  Psychiatric:        Mood and Affect: Mood normal. Mood is not anxious or depressed.        Behavior: Behavior normal.     Results for orders placed or performed during the hospital encounter of 12/18/18 (from the past 48 hour(s))  Urinalysis, Routine w reflex microscopic     Status: None   Collection Time: 12/18/18 12:58 AM  Result Value Ref Range   Color, Urine YELLOW YELLOW   APPearance CLEAR CLEAR   Specific Gravity, Urine 1.027 1.005 - 1.030   pH 5.0 5.0 - 8.0   Glucose, UA NEGATIVE NEGATIVE mg/dL   Hgb urine dipstick NEGATIVE NEGATIVE   Bilirubin Urine NEGATIVE NEGATIVE   Ketones, ur NEGATIVE NEGATIVE mg/dL   Protein, ur NEGATIVE NEGATIVE mg/dL   Nitrite NEGATIVE NEGATIVE   Leukocytes,Ua NEGATIVE NEGATIVE    Comment: Performed at [facility], [address] [place], [place] [address]  Lipase, blood     Status: None   Collection Time: 12/18/18  1:02 AM  Result Value Ref Range   Lipase 34 11 - 51 U/L    Comment: Performed at [facility], [address] [place], [place] [address]  Comprehensive metabolic panel     Status: Abnormal   Collection Time: 12/18/18  1:02 AM  Result Value Ref Range   Sodium  140 135 - 145 mmol/L   Potassium 4.2 3.5 - 5.1 mmol/L   Chloride 105 98 - 111 mmol/L   CO2 26 22 - 32 mmol/L   Glucose, Bld 111 (H) 70 - 99 mg/dL   BUN 15 6 - 20 mg/dL   Creatinine, Ser 0.980.88 0.44 - 1.00 mg/dL   Calcium 9.4 8.9 - 11.910.3 mg/dL   Total Protein 7.5 6.5 - 8.1 g/dL   Albumin 4.0 3.5 - 5.0 g/dL   AST 15 15 - 41 U/L   ALT 13 0 - 44 U/L   Alkaline Phosphatase 47 38 - 126 U/L   Total Bilirubin 0.2 (L) 0.3 - 1.2 mg/dL   GFR calc non Af Amer >60 >60 mL/min   GFR calc Af Amer >60 >60 mL/min   Anion  gap 9 5 - 15    Comment: Performed at [facility], [place] [address], [place], [place] [address]  CBC     Status: None   Collection Time: 12/18/18  1:02 AM  Result Value Ref Range   WBC 8.2 4.0 - 10.5 K/uL   RBC 4.78 3.87 - 5.11 MIL/uL   Hemoglobin 14.7 12.0 - 15.0 g/dL   HCT 42.7 36.0 - 46.0 %   MCV 89.3 80.0 - 100.0 fL   MCH 30.8 26.0 - 34.0 pg   MCHC 34.4 30.0 - 36.0 g/dL   RDW 11.8 11.5 - 15.5 %   Platelets 294 150 - 400 K/uL   nRBC 0.0 0.0 - 0.2 %    Comment: Performed at [facility], [place] [address], [place], [place] [address]  I-Stat beta hCG blood, ED     Status: None   Collection Time: 12/18/18  1:30 AM  Result Value Ref Range   I-stat hCG, quantitative <5.0 <5 mIU/mL   Comment 3            Comment:   GEST. AGE      CONC.  (mIU/mL)   <=1 WEEK        5 - 50     2 WEEKS       50 - 500     3 WEEKS       100 - 10,000     4 WEEKS     1,000 - 30,000        FEMALE AND NON-PREGNANT FEMALE:     LESS THAN 5 mIU/mL    US Abdomen Limited RUQ  Result Date: 12/18/2018 CLINICAL DATA:  Right upper quadrant abdominal pain. Additional history provided by scanning technologist: Right upper quadrant pain since 9 p.m. last night. History of gallstones. EXAM: ULTRASOUND ABDOMEN LIMITED RIGHT UPPER QUADRANT COMPARISON:  Abdominal ultrasound 08/22/2018 FINDINGS: Gallbladder: Cholecystolithiasis. No gallbladder wall thickening. No sonographic Murphy sign noted by sonographer.  Common bile duct: Diameter: 5 mm, within normal limits. Liver: No focal lesion identified. Within normal limits in parenchymal echogenicity. Interrogated portal vein is patent on color Doppler imaging with normal direction of blood flow towards the liver. IMPRESSION: Cholecystolithiasis. Otherwise unremarkable right upper quadrant ultrasound as detailed. Electronically Signed   By: Kellie Simmering DO   On: 12/18/2018 08:03      Assessment/Plan Symptomatic cholelithiasis Hx asthma Hx depression Hx fibromyalgia -back Hx endometriosis  Plan: Discussed with the patient and ED provider.  She has been referred to general surgery but is never obtained an appointment.  Patient felt comfortable going home with some pain medication and something for nausea.  We have arranged follow-up appointment on 12/22/2018 at 72 AM with Dr. Brantley Stage in our office.  He emphasized a no fat diet to mitigate her symptoms.     Marland KitchenEarnstine Regal [facility] 12/18/2018, 8:30 AM Please see Amion for pager number during day hours 7:00am-4:30pm

## 2018-12-18 NOTE — ED Triage Notes (Signed)
Patient reports mid/right abdominal pain with nausea onset this evening , no emesis or diarrhea , denies fever or chills.

## 2018-12-18 NOTE — ED Provider Notes (Signed)
[facility] EMERGENCY DEPARTMENT Provider Note   CSN: [id number] Arrival date & time: 12/18/18  0048     History Chief Complaint  Patient presents with  . Abdominal Pain    Carol Marquez is a 28 y.o. female.  Carol Marquez is a 28 y.o. female with a history of asthma, endometriosis, fibromyalgia, and cholelithiasis, who presents to the emergency department for evaluation of right upper quadrant pain and persistent nausea.  Patient reports symptoms started last night around 9 PM and have been persistent and worsening since then.  She reports that she has not vomited, but has been trying to hold this back and has not had anything to eat or drink to try and prevent this.  Feels persistently nauseated with no appetite.  Reports severe right upper quadrant pain, states that she felt every bump in the road in this area on the way to the hospital.  Pain is a constant sharp ache, made worse with palpation or movement.  Pain is also worse after meals.  No fevers or chills.  Patient reports that she has some chronic lower abdominal pain related to her endometriosis that is always worse before her menstrual cycle which is about to start in 4 days.  She denies any dysuria or urinary frequency.  She has not had any diarrhea, last bowel movement was yesterday which is typical for her.  No blood in the stools.  She reports that she has a known history of gallstones and this feels like the pain she has related to her gallbladder.  She has been referred to surgery before but has not been able to follow-up.  Reports that she has these episodes of pain almost every day now, but this episode has not gone away as they usually do.  She reports pain is worse after eating but she now has episodes of pain not related to meals.  No previous abdominal surgeries.  No other aggravating or alleviating factors.        Past Medical History:  Diagnosis Date  . Asthma   . Endometriosis   . Fibromyalgia      Patient Active Problem List   Diagnosis Date Noted  . Health care maintenance 09/25/2018  . Chronic motor tic disorder 09/25/2018  . 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 Fibromyalgia 09/11/2018  . Establishing care with new doctor, encounter for 09/11/2018  . Asthma 09/11/2018  . Chronic cholecystitis 09/11/2018  . Endometriosis 09/11/2018  . BMI 33.0-33.9,adult 09/11/2018    History reviewed. No pertinent surgical history.   OB History   No obstetric history on file.     Family History  Problem Relation Age of Onset  . Healthy Mother   . Healthy Father     Social History   Tobacco Use  . Smoking status: Never Smoker  . Smokeless tobacco: Never Used  Substance Use Topics  . Alcohol use: No  . Drug use: No    Home Medications Prior to Admission medications   Medication Sig Start Date End Date Taking? Authorizing Provider  albuterol (VENTOLIN HFA) 108 (90 Base) MCG/ACT inhaler Inhale 2 puffs into the lungs every 4 (four) hours as needed for wheezing or shortness of breath. 09/11/18  Yes Patriciaann Clan, DO  drospirenone-ethinyl estradiol (YAZ) 3-0.02 MG tablet Take 1 tablet by mouth daily. 11/24/18  Yes Beard, Samantha N, DO  sertraline (ZOLOFT) 50 MG tablet Take 1 tablet (50 mg total) by mouth daily. 10/24/18  Yes Patriciaann Clan, DO  HYDROcodone-acetaminophen (NORCO/VICODIN) 5-325 MG tablet Take 1 tablet by mouth every 6 (six) hours as needed for severe pain. Patient not taking: Reported on 12/18/2018 08/22/18   Caccavale, Sophia, PA-C  ondansetron (ZOFRAN) 4 MG tablet Take 1 tablet (4 mg total) by mouth every 8 (eight) hours as needed for nausea or vomiting. Patient not taking: Reported on 12/18/2018 08/22/18   Caccavale, Sophia, PA-C    Allergies    Codeine  Review of Systems   Review of Systems  Constitutional: Negative for chills and fever.  HENT: Negative.   Respiratory: Negative for cough and shortness of breath.   Cardiovascular: Negative  for chest pain.  Gastrointestinal: Positive for abdominal pain and nausea. Negative for blood in stool, constipation, diarrhea and vomiting.  Genitourinary: Negative for dysuria and frequency.  Musculoskeletal: Negative for arthralgias and myalgias.  Skin: Negative for color change and rash.  Neurological: Negative for dizziness, syncope and light-headedness.  All other systems reviewed and are negative.   Physical Exam Updated Vital Signs BP 130/76 (BP Location: Right Arm)   Pulse 78   Temp 98.4 F (36.9 C) (Oral)   Resp 16   LMP 11/23/2018   SpO2 100%   Physical Exam Vitals and nursing note reviewed.  Constitutional:      General: She is not in acute distress.    Appearance: She is well-developed. She is obese. She is not diaphoretic.     Comments: Patient appears uncomfortable but is in no acute distress  HENT:     Head: Normocephalic and atraumatic.  Eyes:     General:        Right eye: No discharge.        Left eye: No discharge.     Pupils: Pupils are equal, round, and reactive to light.  Cardiovascular:     Rate and Rhythm: Normal rate and regular rhythm.     Heart sounds: Normal heart sounds.  Pulmonary:     Effort: Pulmonary effort is normal. No respiratory distress.     Breath sounds: Normal breath sounds. No wheezing or rales.  Abdominal:     General: Bowel sounds are normal. There is no distension.     Palpations: Abdomen is soft. There is no mass.     Tenderness: There is abdominal tenderness in the right upper quadrant. There is guarding. Positive signs include Murphy's sign.     Comments: Abdomen is soft and nondistended, bowel sounds present, patient with focal right upper quadrant tenderness with some guarding, positive Murphy sign.  Patient has some diffuse tenderness across the low abdomen which she reports is typical for her with her endometriosis.  Musculoskeletal:        General: No deformity.     Cervical back: Neck supple.  Skin:    General: Skin  is warm and dry.     Capillary Refill: Capillary refill takes less than 2 seconds.  Neurological:     Mental Status: She is alert and oriented to person, place, and time.     Coordination: Coordination normal.     Comments: Speech is clear, able to follow commands Moves extremities without ataxia, coordination intact  Psychiatric:        Mood and Affect: Mood normal.        Behavior: Behavior normal.     ED Results / Procedures / Treatments   Labs (all labs ordered are listed, but only abnormal results are displayed) Labs Reviewed  COMPREHENSIVE METABOLIC PANEL - Abnormal; Notable for the following  components:      Result Value   Glucose, Bld 111 (*)    Total Bilirubin 0.2 (*)    All other components within normal limits  LIPASE, BLOOD  CBC  URINALYSIS, ROUTINE W REFLEX MICROSCOPIC  I-STAT BETA HCG BLOOD, ED ([facility], [facility], [facility] ONLY)    EKG None  Radiology US Abdomen Limited RUQ  Result Date: 12/18/2018 CLINICAL DATA:  Right upper quadrant abdominal pain. Additional history provided by scanning technologist: Right upper quadrant pain since 9 p.m. last night. History of gallstones. EXAM: ULTRASOUND ABDOMEN LIMITED RIGHT UPPER QUADRANT COMPARISON:  Abdominal ultrasound 08/22/2018 FINDINGS: Gallbladder: Cholecystolithiasis. No gallbladder wall thickening. No sonographic Murphy sign noted by sonographer. Common bile duct: Diameter: 5 mm, within normal limits. Liver: No focal lesion identified. Within normal limits in parenchymal echogenicity. Interrogated portal vein is patent on color Doppler imaging with normal direction of blood flow towards the liver. IMPRESSION: Cholecystolithiasis. Otherwise unremarkable right upper quadrant ultrasound as detailed. Electronically Signed   By: Jackey LogeKyle  Golden DO   On: 12/18/2018 08:03    Procedures Procedures (including critical care time)  Medications Ordered in ED Medications  sodium chloride flush (NS) 0.9 % injection 3 mL (3 mLs Intravenous Not  Given 12/18/18 0722)  morphine 4 MG/ML injection 4 mg (has no administration in time range)  metoCLOPramide (REGLAN) injection 10 mg (has no administration in time range)  sodium chloride 0.9 % bolus 1,000 mL (0 mLs Intravenous Stopped 12/18/18 0826)  ondansetron (ZOFRAN) injection 4 mg (4 mg Intravenous Given 12/18/18 0717)  morphine 4 MG/ML injection 4 mg (4 mg Intravenous Given 12/18/18 [id number])    ED Course  I have reviewed the triage vital signs and the nursing notes.  Pertinent labs & imaging results that were available during my care of the patient were reviewed by me and considered in my medical decision making (see chart for details).  Clinical Course as of Dec 17 825  Thu Dec 18, 2018  0700 No leukocytosis  CBC [KF]  0700 Normal LFTs and no significant electrolyte derangements, normal lipase as well  Comprehensive metabolic panel(!) [KF]  0805 Ultrasound shows cholelithiasis without evidence of acute cholecystitis, went in to reevaluate patient and she continues to have right upper quadrant pain and tenderness on exam and reports continued nausea.  Additional pain and nausea medicine ordered.  Will consult general surgery given persistent biliary colic now present almost daily.  US Abdomen Limited RUQ [KF]  0826 Case discussed with PA Will Marlyne BeardsJennings with general surgery who will come and evaluate the patient   [KF]    Clinical Course User Index [KF] Legrand RamsFord, Helder Crisafulli N, PA-C   MDM Rules/Calculators/A&P  Patient with known history of gallstones presenting with right upper quadrant pain and persistent nausea that has been worsening and is now constant.  Has been referred previously to general surgery but not seen.  Her right upper quadrant ultrasound today shows gallstones without signs of acute cholecystitis and lab work is reassuring without leukocytosis or abnormal LFTs.  Discussed with general surgery and PA Juluis MireWilliam Jennings has seen and evaluated patient, they feel patient is stable  for discharge home given chronicity of symptoms and reassuring imaging and lab work, they have scheduled patient to see Dr. Luisa Hartornett in the office on 12/14 and requested the patient go home on no fat diet to help mitigate her symptoms with pain and nausea medication to help manage symptoms at home.  I have discussed this plan with the patient and she is  in agreement.  Return precautions discussed.  Patient discharged home in good condition.  Final Clinical Impression(s) / ED Diagnoses Final diagnoses:  RUQ abdominal pain  Calculus of gallbladder without cholecystitis without obstruction  Biliary colic    Rx / DC Orders ED Discharge Orders         Ordered    ondansetron (ZOFRAN ODT) 4 MG disintegrating tablet     12/18/18 0946    oxyCODONE-acetaminophen (PERCOCET) 5-325 MG tablet  Every 6 hours PRN     12/18/18 0946           Dartha Lodge, PA-C 12/18/18 1001    Terrilee Files, MD 12/18/18 [phone]

## 2019-01-03 ENCOUNTER — Emergency Department (HOSPITAL_COMMUNITY): Payer: Self-pay

## 2019-01-03 ENCOUNTER — Encounter (HOSPITAL_COMMUNITY): Payer: Self-pay | Admitting: Emergency Medicine

## 2019-01-03 ENCOUNTER — Other Ambulatory Visit: Payer: Self-pay

## 2019-01-03 ENCOUNTER — Emergency Department (HOSPITAL_COMMUNITY)
Admission: EM | Admit: 2019-01-03 | Discharge: 2019-01-03 | Disposition: A | Discharge status: Home / Self Care | Payer: Self-pay | Attending: Emergency Medicine | Admitting: Emergency Medicine

## 2019-01-03 DIAGNOSIS — J45909 Unspecified asthma, uncomplicated: Secondary | ICD-10-CM | POA: Insufficient documentation

## 2019-01-03 DIAGNOSIS — R1011 Right upper quadrant pain: Secondary | ICD-10-CM

## 2019-01-03 DIAGNOSIS — Z79899 Other long term (current) drug therapy: Secondary | ICD-10-CM | POA: Insufficient documentation

## 2019-01-03 DIAGNOSIS — K8020 Calculus of gallbladder without cholecystitis without obstruction: Secondary | ICD-10-CM | POA: Insufficient documentation

## 2019-01-03 HISTORY — DX: Calculus of gallbladder without cholecystitis without obstruction: K80.20

## 2019-01-03 LAB — URINALYSIS, ROUTINE W REFLEX MICROSCOPIC
Bilirubin Urine: NEGATIVE
Glucose, UA: NEGATIVE mg/dL
Hgb urine dipstick: NEGATIVE
Ketones, ur: NEGATIVE mg/dL
Leukocytes,Ua: NEGATIVE
Nitrite: NEGATIVE
Protein, ur: NEGATIVE mg/dL
Specific Gravity, Urine: 1.028 (ref 1.005–1.030)
pH: 5 (ref 5.0–8.0)

## 2019-01-03 LAB — CBC
HCT: 41.1 % (ref 36.0–46.0)
Hemoglobin: 13.8 g/dL (ref 12.0–15.0)
MCH: 30.5 pg (ref 26.0–34.0)
MCHC: 33.6 g/dL (ref 30.0–36.0)
MCV: 90.9 fL (ref 80.0–100.0)
Platelets: 265 10*3/uL (ref 150–400)
RBC: 4.52 MIL/uL (ref 3.87–5.11)
RDW: 12 % (ref 11.5–15.5)
WBC: 7.1 10*3/uL (ref 4.0–10.5)
nRBC: 0 % (ref 0.0–0.2)

## 2019-01-03 LAB — COMPREHENSIVE METABOLIC PANEL
ALT: 15 U/L (ref 0–44)
AST: 15 U/L (ref 15–41)
Albumin: 3.9 g/dL (ref 3.5–5.0)
Alkaline Phosphatase: 45 U/L (ref 38–126)
Anion gap: 9 (ref 5–15)
BUN: 17 mg/dL (ref 6–20)
CO2: 25 mmol/L (ref 22–32)
Calcium: 9.4 mg/dL (ref 8.9–10.3)
Chloride: 106 mmol/L (ref 98–111)
Creatinine, Ser: 0.9 mg/dL (ref 0.44–1.00)
GFR calc Af Amer: >60 mL/min (ref >60)
GFR calc non Af Amer: >60 mL/min (ref >60)
Glucose, Bld: 99 mg/dL (ref 70–99)
Potassium: 3.7 mmol/L (ref 3.5–5.1)
Sodium: 140 mmol/L (ref 135–145)
Total Bilirubin: 0.6 mg/dL (ref 0.3–1.2)
Total Protein: 7.1 g/dL (ref 6.5–8.1)

## 2019-01-03 LAB — LIPASE, BLOOD: Lipase: 30 U/L (ref 11–51)

## 2019-01-03 LAB — I-STAT BETA HCG BLOOD, ED (MC, WL, AP ONLY): I-stat hCG, quantitative: <5 m[IU]/mL (ref <5)

## 2019-01-03 MED ORDER — MORPHINE SULFATE (PF) 4 MG/ML IV SOLN
4.0000 mg | Freq: Once | INTRAVENOUS | Status: AC
  Administered 2019-01-03: 4 mg via INTRAVENOUS
  Filled 2019-01-03: qty 1

## 2019-01-03 MED ORDER — OXYCODONE-ACETAMINOPHEN 5-325 MG PO TABS: 1.0000 | ORAL_TABLET | Freq: Four times a day (QID) | ORAL | 0 refills | Status: DC | PRN

## 2019-01-03 MED ORDER — SODIUM CHLORIDE 0.9 % IV BOLUS
1000.0000 mL | Freq: Once | INTRAVENOUS | Status: AC
  Administered 2019-01-03: 1000 mL via INTRAVENOUS

## 2019-01-03 MED ORDER — ONDANSETRON 4 MG PO TBDP: ORAL_TABLET | ORAL | 0 refills | Status: DC

## 2019-01-03 MED ORDER — ONDANSETRON HCL 4 MG/2ML IJ SOLN
4.0000 mg | Freq: Once | INTRAMUSCULAR | Status: AC
  Administered 2019-01-03: 4 mg via INTRAVENOUS
  Filled 2019-01-03: qty 2

## 2019-01-03 MED ORDER — SODIUM CHLORIDE 0.9% FLUSH
3.0000 mL | Freq: Once | INTRAVENOUS | Status: AC
  Administered 2019-01-03: 14:00:00 3 mL via INTRAVENOUS

## 2019-01-03 NOTE — Discharge Instructions (Signed)
Your lab work and ultrasound are reassuring today, you have gallstones but no signs of acute cholecystitis.  It is really important that you follow-up outpatient with surgery given your continued symptoms.  Continue using Zofran for nausea and prescribed pain medication as needed.  Avoid any fat in your diet as this can trigger episodes of biliary colic.  Return to the ED if you have fevers, persistent vomiting, new or worsening pain.

## 2019-01-03 NOTE — ED Triage Notes (Signed)
C/o R sided abd pain, nausea, and diarrhea since 2am.  States she needs to have gallbladder surgery but hasn't due to financial reasons.

## 2019-01-03 NOTE — ED Notes (Signed)
Pt given water for PO challenge. Pt tolerating well at this time 

## 2019-01-03 NOTE — ED Provider Notes (Signed)
[facility] EMERGENCY DEPARTMENT Provider Note   CSN: [id number] Arrival date & time: 01/03/19  0175     History Chief Complaint  Patient presents with  . Abdominal Pain    Carol Marquez is a 28 y.o. female.  Carol Marquez is a 28 y.o. female with a history of gallstones and chronic cholecystitis, fibromyalgia, obesity, endometriosis, and asthma, who presents to the emergency department for evaluation of right upper quadrant abdominal pain.  Patient states that this episode of pain began around 2 AM this morning.  She states that then it has been a constant discomfort in the right upper quadrant that sometimes radiates over to the left upper quadrant.  She has had persistent nausea without any episodes of vomiting.  Yesterday during the day she had a few episodes of nonbloody diarrhea.  She states that she took a dose of the pain and nausea medication that was prescribed to her during recent ED visit on 12/10 for the same symptoms, but this did not help her symptoms.  Patient was scheduled for outpatient appointment with general surgery for evaluation of her symptoms but she was unable to keep this appointment due to lack of insurance and financial constraints.  Denies any other aggravating or alleviating factors.  States that she has maintained a low to no fat diet but continues to have frequent pain episodes despite this.  The history is provided by the patient.       Past Medical History:  Diagnosis Date  . Asthma   . Calculus of gallbladder   . Endometriosis   . Fibromyalgia     Patient Active Problem List   Diagnosis Date Noted  . Health care maintenance 09/25/2018  . Chronic motor tic disorder 09/25/2018  . 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 Fibromyalgia 09/11/2018  . Establishing care with new doctor, encounter for 09/11/2018  . Asthma 09/11/2018  . Chronic cholecystitis 09/11/2018  . Endometriosis 09/11/2018  . BMI 33.0-33.9,adult  09/11/2018    History reviewed. No pertinent surgical history.   OB History   No obstetric history on file.     Family History  Problem Relation Age of Onset  . Healthy Mother   . Healthy Father     Social History   Tobacco Use  . Smoking status: Never Smoker  . Smokeless tobacco: Never Used  Substance Use Topics  . Alcohol use: No  . Drug use: No    Home Medications Prior to Admission medications   Medication Sig Start Date End Date Taking? Authorizing Provider  albuterol (VENTOLIN HFA) 108 (90 Base) MCG/ACT inhaler Inhale 2 puffs into the lungs every 4 (four) hours as needed for wheezing or shortness of breath. 09/11/18   Patriciaann Clan, DO  drospirenone-ethinyl estradiol (YAZ) 3-0.02 MG tablet Take 1 tablet by mouth daily. 11/24/18   Patriciaann Clan, DO  ondansetron (ZOFRAN ODT) 4 MG disintegrating tablet 4mg  ODT q4 hours prn nausea/vomit 01/03/19   Jacqlyn Larsen, PA-C  oxyCODONE-acetaminophen (PERCOCET) 5-325 MG tablet Take 1 tablet by mouth every 6 (six) hours as needed. 01/03/19   Jacqlyn Larsen, PA-C  sertraline (ZOLOFT) 50 MG tablet Take 1 tablet (50 mg total) by mouth daily. 10/24/18   Patriciaann Clan, DO    Allergies    Codeine  Review of Systems   Review of Systems  Constitutional: Negative for chills and fever.  HENT: Negative.   Respiratory: Negative for cough and shortness of breath.   Cardiovascular: Negative  for chest pain.  Gastrointestinal: Positive for abdominal pain, diarrhea and nausea. Negative for blood in stool and vomiting.  Genitourinary: Negative for dysuria and frequency.  Musculoskeletal: Negative for arthralgias and myalgias.  Skin: Negative for color change and rash.  Neurological: Negative for dizziness, syncope and light-headedness.    Physical Exam Updated Vital Signs BP (!) 139/91 (BP Location: Right Arm)   Pulse 90   Temp 98.9 F (37.2 C) (Oral)   Resp 18   LMP 11/23/2018   SpO2 99%   Physical Exam Vitals and  nursing note reviewed.  Constitutional:      General: She is not in acute distress.    Appearance: She is well-developed. She is obese. She is not ill-appearing or diaphoretic.  HENT:     Head: Normocephalic and atraumatic.  Eyes:     General:        Right eye: No discharge.        Left eye: No discharge.     Pupils: Pupils are equal, round, and reactive to light.  Cardiovascular:     Rate and Rhythm: Normal rate and regular rhythm.     Heart sounds: Normal heart sounds. No murmur. No friction rub. No gallop.   Pulmonary:     Effort: Pulmonary effort is normal. No respiratory distress.     Breath sounds: Normal breath sounds. No wheezing or rales.     Comments: Respirations equal and unlabored, patient able to speak in full sentences, lungs clear to auscultation bilaterally Abdominal:     General: Bowel sounds are normal. There is no distension.     Palpations: Abdomen is soft. There is no mass.     Tenderness: There is abdominal tenderness in the right upper quadrant and epigastric area. There is no guarding.     Comments: Abdomen is soft, nondistended, bowel sounds present throughout, there is focal tenderness in the right upper quadrant with some mild epigastric tenderness, slight guarding on palpation in the right upper quadrant, all other quadrants nontender to palpation, no rigidity  Musculoskeletal:        General: No deformity.     Cervical back: Neck supple.  Skin:    General: Skin is warm and dry.     Capillary Refill: Capillary refill takes less than 2 seconds.  Neurological:     Mental Status: She is alert.     Coordination: Coordination normal.     Comments: Speech is clear, able to follow commands Moves extremities without ataxia, coordination intact  Psychiatric:        Mood and Affect: Mood normal.        Behavior: Behavior normal.     ED Results / Procedures / Treatments   Labs (all labs ordered are listed, but only abnormal results are displayed) Labs  Reviewed  URINALYSIS, ROUTINE W REFLEX MICROSCOPIC - Abnormal; Notable for the following components:      Result Value   APPearance HAZY (*)    All other components within normal limits  LIPASE, BLOOD  COMPREHENSIVE METABOLIC PANEL  CBC  I-STAT BETA HCG BLOOD, ED ([facility], [facility], [facility] ONLY)    EKG None  Radiology US Abdomen Limited RUQ  Result Date: 01/03/2019 CLINICAL DATA:  Right upper quadrant abdomen pain for 1 day EXAM: ULTRASOUND ABDOMEN LIMITED RIGHT UPPER QUADRANT COMPARISON:  December 18, 2018 FINDINGS: Gallbladder: Gallstones are noted the gallbladder. No sonographic Eulah PontMurphy sign is reported. No pericholecystic fluid is identified. The gallbladder wall measures 3.8 mm. Common bile duct: Diameter:  3.2 mm Liver: No focal lesion identified. Within normal limits in parenchymal echogenicity. Portal vein is patent on color Doppler imaging with normal direction of blood flow towards the liver. Other: None. IMPRESSION: Cholelithiasis without sonographic evidence of acute cholecystitis. Electronically Signed   By: Sherian Rein M.D.   On: 01/03/2019 12:05    Procedures Procedures (including critical care time)  Medications Ordered in ED Medications  sodium chloride flush (NS) 0.9 % injection 3 mL (3 mLs Intravenous Given 01/03/19 1341)  sodium chloride 0.9 % bolus 1,000 mL (1,000 mLs Intravenous New Bag/Given 01/03/19 1257)  ondansetron (ZOFRAN) injection 4 mg (4 mg Intravenous Given 01/03/19 1253)  morphine 4 MG/ML injection 4 mg (4 mg Intravenous Given 01/03/19 1254)    ED Course  I have reviewed the triage vital signs and the nursing notes.  Pertinent labs & imaging results that were available during my care of the patient were reviewed by me and considered in my medical decision making (see chart for details).  Clinical Course as of Jan 03 1419  Sat Jan 03, 2019  8970 28 year old female with known gallbladder disease presents with right upper quadrant pain with associated nausea and  diarrhea, no associated fevers.  She has been seen for similar symptoms multiple times before, has been unable to follow-up with general surgery due to financial constraints and lack of insurance.  She is focally tender in the right upper quadrant, but is otherwise well-appearing and in no distress.  Suspect this is an episode of biliary colic, labs ordered from triage which show no leukocytosis, normal LFTs and electrolytes and normal lipase.  Will get right upper quadrant ultrasound to assess for acute cholecystitis and give medications for symptom management.   [KF]  1040 No leukocytosis  CBC [KF]  1040 Normal LFTs, T bili of 0.6, normal lipase, no signs of biliary obstruction  Comprehensive metabolic panel [KF]  1212 Ultrasound shows cholelithiasis without evidence of acute cholecystitis consistent with patient's previous ultrasounds.  Given persistent recurring symptoms will consult general surgery to discuss treatment options given patient's financial constraints  US Abdomen Limited RUQ [KF]  1229 Case discussed with Dr. Cliffton Asters with general surgery who will send a message to the outpatient office to hopefully get patient scheduled for appointment, they will try and work with her despite her financial constraints, he does not feel that she needs admission given reassuring lab work and ultrasound, I discussed this plan with the patient who is in agreement with this plan.  Will focus on symptom management.   [KF]  1401 Patient feeling much better after medications and is tolerating p.o. fluids with no emesis.  Will discharge home with Zofran and pain medication, stressed the importance of surgery follow-up   [KF]    Clinical Course User Index [KF] Legrand Rams   MDM Rules/Calculators/A&P                      28 year old female with known history of cholecystitis presents with right upper quadrant pain, nausea and diarrhea, lab work today is reassuring and ultrasound shows cholelithiasis  without signs of acute cholecystitis.  After symptomatic treatment her pain is significantly improved and she is tolerating p.o. fluids.  I discussed with general surgery and they do not feel she needs admission or emergent surgery at this time but they will send message to outpatient surgery office to help organize follow-up.  Stressed the importance of surgery follow-up given patient's persistent symptoms.  Return  precautions discussed.  Patient expresses understanding and agreement.  Discharged home in good condition.  Final Clinical Impression(s) / ED Diagnoses Final diagnoses:  RUQ pain  Calculus of gallbladder without cholecystitis without obstruction    Rx / DC Orders ED Discharge Orders         Ordered    oxyCODONE-acetaminophen (PERCOCET) 5-325 MG tablet  Every 6 hours PRN     01/03/19 1418    ondansetron (ZOFRAN ODT) 4 MG disintegrating tablet     01/03/19 1418           Jodi Geralds [place], [place] 01/03/19 1420    Cathren Laine, MD 01/04/19 1004

## 2019-01-09 DIAGNOSIS — G9332 Myalgic encephalomyelitis/chronic fatigue syndrome: Secondary | ICD-10-CM

## 2019-01-09 HISTORY — DX: Myalgic encephalomyelitis/chronic fatigue syndrome: G93.32

## 2019-01-14 ENCOUNTER — Other Ambulatory Visit: Payer: Self-pay | Admitting: *Deleted

## 2019-01-14 DIAGNOSIS — F32A Depression, unspecified: Secondary | ICD-10-CM

## 2019-01-14 DIAGNOSIS — F329 Major depressive disorder, single episode, unspecified: Secondary | ICD-10-CM

## 2019-01-14 MED ORDER — SERTRALINE HCL 50 MG PO TABS: 50.0000 mg | ORAL_TABLET | Freq: Every day | ORAL | 3 refills | Status: DC

## 2019-01-15 ENCOUNTER — Encounter: Payer: Self-pay | Admitting: Family Medicine

## 2019-02-12 ENCOUNTER — Ambulatory Visit: Payer: Self-pay | Admitting: Internal Medicine

## 2019-02-12 ENCOUNTER — Encounter: Payer: Self-pay | Admitting: Internal Medicine

## 2019-02-12 ENCOUNTER — Other Ambulatory Visit: Payer: Self-pay

## 2019-02-12 VITALS — BP 128/88 | HR 72 | Resp 12 | Ht 67.0 in | Wt 224.0 lb

## 2019-02-12 DIAGNOSIS — J302 Other seasonal allergic rhinitis: Secondary | ICD-10-CM

## 2019-02-12 DIAGNOSIS — J45909 Unspecified asthma, uncomplicated: Secondary | ICD-10-CM

## 2019-02-12 DIAGNOSIS — F32A Depression, unspecified: Secondary | ICD-10-CM

## 2019-02-12 DIAGNOSIS — F329 Major depressive disorder, single episode, unspecified: Secondary | ICD-10-CM

## 2019-02-12 DIAGNOSIS — E669 Obesity, unspecified: Secondary | ICD-10-CM

## 2019-02-12 DIAGNOSIS — Z6835 Body mass index (BMI) 35.0-35.9, adult: Secondary | ICD-10-CM

## 2019-02-12 DIAGNOSIS — K8020 Calculus of gallbladder without cholecystitis without obstruction: Secondary | ICD-10-CM

## 2019-02-12 MED ORDER — FLUTICASONE-SALMETEROL 250-50 MCG/DOSE IN AEPB: 1.0000 | INHALATION_SPRAY | Freq: Two times a day (BID) | RESPIRATORY_TRACT | 11 refills | Status: DC

## 2019-02-12 MED ORDER — ALBUTEROL SULFATE HFA 108 (90 BASE) MCG/ACT IN AERS: 2.0000 | INHALATION_SPRAY | RESPIRATORY_TRACT | 1 refills | Status: DC | PRN

## 2019-02-12 MED ORDER — SERTRALINE HCL 50 MG PO TABS: 50.0000 mg | ORAL_TABLET | Freq: Every day | ORAL | 11 refills | Status: DC

## 2019-02-12 MED ORDER — FEXOFENADINE HCL 180 MG PO TABS: 180.0000 mg | ORAL_TABLET | Freq: Every day | ORAL | Status: DC

## 2019-02-12 NOTE — Progress Notes (Signed)
Subjective:    Patient ID: Carol Marquez, female   DOB: 04/14/1990, 29 y.o.   MRN: [id number]   HPI   1.  Cholelithiasis:  Epigastric pain intermittently for past 4 years.  Stabbing pain with radiation to her back.  Thought she was having a panic attack.   Finally went to [facility] in [place] like 10/2016 with severe pain and diagnosed with cholelithiasis.  Ultrasound showed no cholecystitis, however. She continue to have episodes of severe pain and ultimately was told she should have surgery, but did not have coverage to get the surgery.  Had another episode (one of several in recent weeks) and went to [facility] with similar findings on repeat ultrasound. Does have associated nausea and bloating with the episodes, but no vomiting or change in stool consistency or color. No fever associated.   Appears her liver enzymes and WBC counts have been normal.   Was told to make an outpatient appt with gen surgery for probably removal of GB with last ED visit at Christmas.  2.  Asthma/allergies:  During cold weather, has episode of wheezing once weekly.  Does have allergies as well.   Runny eyes that itch, nasal congestion, sneezing, congestion.  Can trigger asthma.  Has more issues in the fall with allergies.   She would use her rescue inhaler daily if she could--limited due to cost.  Currently uses just once weekly.   Nosebleeds with nasal corticosteroids in past.   3.  Depression/PTSD:  Goes to [employer] [facility] the [place].     Current Meds  Medication Sig  . albuterol (VENTOLIN HFA) 108 (90 Base) MCG/ACT inhaler Inhale 2 puffs into the lungs every 4 (four) hours as needed for wheezing or shortness of breath.  . drospirenone-ethinyl estradiol (YAZ) 3-0.02 MG tablet Take 1 tablet by mouth daily.  Marland Kitchen ibuprofen (ADVIL) 200 MG tablet Take 200 mg by mouth every 6 (six) hours as needed.  . ondansetron (ZOFRAN ODT) 4 MG disintegrating tablet 4mg  ODT q4 hours prn nausea/vomit  .  oxyCODONE-acetaminophen (PERCOCET) 5-325 MG tablet Take 1 tablet by mouth every 6 (six) hours as needed.  . sertraline (ZOLOFT) 50 MG tablet Take 1 tablet (50 mg total) by mouth daily.   Allergies  Allergen Reactions  . Codeine Nausea Only     Review of Systems    Objective:   BP 128/88 (BP Location: Left Arm, Patient Position: Sitting, Cuff Size: Large)   Pulse 72   Resp 12   Ht 5\' 7"  (1.702 m)   Wt 224 lb (101.6 kg)   LMP 01/18/2019   BMI 35.08 kg/m   Physical Exam  NAD HEENT:  PERRL, EOMI, conjunctivae without injection.  TMs pearly gray, throat without cobbling or injection.  Nasal mucosa boggy with clear discharge. Neck:  Supple, No adenopathy, no thyromegaly Chest:  CTA CV:  RRR with normal S1 and S2, No S3, S4 or murmur.  Radial and DP pulses normal and equal.   Abd:  Mild RUQ tenderness with deep palpation.  No Murphy's sign.  No HSM or mass, + BS LE:  No edema.   Assessment & Plan   1.  Recurrent RUQ pain, bloating and N & V, felt secondary to documented cholelithiasis:  Referral to Gen Surgery.   Low fat diet.  2.  Asthma:  Not controlled.  Control triggers, including allergies below.  Advair 250/50 mcg one inhalation twice daily Albuterol for rescue inhalation only.  Discussed maintenance with control of  allergies and use of Advair and difference with Albuterol and rescue treatment.  3.  Allergies:  Fexofenadine 180 mg daily, particularly in fall when has most of symptoms.  Consider nasal corticosteroids as well if needed.  4.  Depression:  Sertraline refilled.  As per [facility].  5.  Obesity:  Will work on this at follow up.

## 2019-02-12 NOTE — Patient Instructions (Signed)
Brush teeth and tongue after each use of ADvair  Get signed up with [facility] next week for inhalers.

## 2019-03-02 ENCOUNTER — Emergency Department (HOSPITAL_COMMUNITY)
Admission: EM | Admit: 2019-03-02 | Discharge: 2019-03-03 | Disposition: A | Discharge status: Home / Self Care | Payer: Self-pay | Attending: Emergency Medicine | Admitting: Emergency Medicine

## 2019-03-02 ENCOUNTER — Encounter (HOSPITAL_COMMUNITY): Payer: Self-pay | Admitting: Emergency Medicine

## 2019-03-02 ENCOUNTER — Other Ambulatory Visit: Payer: Self-pay

## 2019-03-02 DIAGNOSIS — K8020 Calculus of gallbladder without cholecystitis without obstruction: Secondary | ICD-10-CM | POA: Insufficient documentation

## 2019-03-02 DIAGNOSIS — J45909 Unspecified asthma, uncomplicated: Secondary | ICD-10-CM | POA: Insufficient documentation

## 2019-03-02 DIAGNOSIS — Z79899 Other long term (current) drug therapy: Secondary | ICD-10-CM | POA: Insufficient documentation

## 2019-03-02 LAB — COMPREHENSIVE METABOLIC PANEL
ALT: 17 U/L (ref 0–44)
AST: 16 U/L (ref 15–41)
Albumin: 4 g/dL (ref 3.5–5.0)
Alkaline Phosphatase: 45 U/L (ref 38–126)
Anion gap: 10 (ref 5–15)
BUN: 12 mg/dL (ref 6–20)
CO2: 23 mmol/L (ref 22–32)
Calcium: 9.2 mg/dL (ref 8.9–10.3)
Chloride: 103 mmol/L (ref 98–111)
Creatinine, Ser: 1.01 mg/dL — ABNORMAL HIGH (ref 0.44–1.00)
GFR calc Af Amer: >60 mL/min (ref >60)
GFR calc non Af Amer: >60 mL/min (ref >60)
Glucose, Bld: 106 mg/dL — ABNORMAL HIGH (ref 70–99)
Potassium: 4 mmol/L (ref 3.5–5.1)
Sodium: 136 mmol/L (ref 135–145)
Total Bilirubin: 1 mg/dL (ref 0.3–1.2)
Total Protein: 7.3 g/dL (ref 6.5–8.1)

## 2019-03-02 LAB — CBC
HCT: 42.8 % (ref 36.0–46.0)
Hemoglobin: 14.3 g/dL (ref 12.0–15.0)
MCH: 30 pg (ref 26.0–34.0)
MCHC: 33.4 g/dL (ref 30.0–36.0)
MCV: 89.9 fL (ref 80.0–100.0)
Platelets: 275 10*3/uL (ref 150–400)
RBC: 4.76 MIL/uL (ref 3.87–5.11)
RDW: 11.7 % (ref 11.5–15.5)
WBC: 6.6 10*3/uL (ref 4.0–10.5)
nRBC: 0 % (ref 0.0–0.2)

## 2019-03-02 LAB — URINALYSIS, ROUTINE W REFLEX MICROSCOPIC
Bacteria, UA: NONE SEEN
Bilirubin Urine: NEGATIVE
Glucose, UA: NEGATIVE mg/dL
Ketones, ur: NEGATIVE mg/dL
Nitrite: NEGATIVE
Protein, ur: NEGATIVE mg/dL
Specific Gravity, Urine: 1.026 (ref 1.005–1.030)
pH: 5 (ref 5.0–8.0)

## 2019-03-02 LAB — I-STAT BETA HCG BLOOD, ED (MC, WL, AP ONLY): I-stat hCG, quantitative: <5 m[IU]/mL (ref <5)

## 2019-03-02 LAB — LIPASE, BLOOD: Lipase: 25 U/L (ref 11–51)

## 2019-03-02 MED ORDER — SODIUM CHLORIDE 0.9% FLUSH: 3.0000 mL | Freq: Once | INTRAVENOUS | Status: DC

## 2019-03-02 NOTE — ED Triage Notes (Signed)
Pt c/o abdominal pain and nausea x 3 days. Denies diarrhea.

## 2019-03-03 ENCOUNTER — Emergency Department (HOSPITAL_COMMUNITY): Payer: Self-pay

## 2019-03-03 MED ORDER — MORPHINE SULFATE (PF) 4 MG/ML IV SOLN
4.0000 mg | Freq: Once | INTRAVENOUS | Status: AC
  Administered 2019-03-03: 01:00:00 4 mg via INTRAVENOUS
  Filled 2019-03-03: qty 1

## 2019-03-03 MED ORDER — HYDROCODONE-ACETAMINOPHEN 5-325 MG PO TABS: 1.0000 | ORAL_TABLET | Freq: Four times a day (QID) | ORAL | 0 refills | Status: DC | PRN

## 2019-03-03 MED ORDER — ONDANSETRON HCL 4 MG PO TABS: 4.0000 mg | ORAL_TABLET | Freq: Three times a day (TID) | ORAL | 0 refills | Status: DC | PRN

## 2019-03-03 MED ORDER — ONDANSETRON HCL 4 MG/2ML IJ SOLN
4.0000 mg | Freq: Once | INTRAMUSCULAR | Status: AC
  Administered 2019-03-03: 03:00:00 4 mg via INTRAVENOUS
  Filled 2019-03-03: qty 2

## 2019-03-03 MED ORDER — ONDANSETRON HCL 4 MG/2ML IJ SOLN
4.0000 mg | Freq: Once | INTRAMUSCULAR | Status: AC
  Administered 2019-03-03: 01:00:00 4 mg via INTRAVENOUS
  Filled 2019-03-03: qty 2

## 2019-03-03 NOTE — ED Provider Notes (Signed)
[facility] EMERGENCY DEPARTMENT Provider Note   CSN: [id number] Arrival date & time: 03/02/19  2028     History Chief Complaint  Patient presents with  . Abdominal Pain    Carol Marquez is a 29 y.o. female.  Patient presents to the emergency department with a chief complaint of "gallbladder attack."  She states that she has known cholelithiasis, and is awaiting surgery.  She does not have insurance and is trying to get surgery through the orange card program.  She states that for the past 3 days she has had gradually worsening right upper quadrant pain.  She states the pain radiates to her back.  She reports associated nausea.  She has not had vomiting but states that she has spit up into her mouth a little bit.  She rates her pain is severe.  She has been eating/drinking broth.  She denies any fevers.  Denies any other associated symptoms.  The history is provided by the patient. No language interpreter was used.       Past Medical History:  Diagnosis Date  . Asthma    Diagnosed age 84 or 29 yo.  . Calculus of gallbladder   . Endometriosis   . Fibromyalgia     Patient Active Problem List   Diagnosis Date Noted  . Health care maintenance 09/25/2018  . Chronic motor tic disorder 09/25/2018  . 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 Fibromyalgia 09/11/2018  . Establishing care with new doctor, encounter for 09/11/2018  . Asthma 09/11/2018  . Chronic cholecystitis 09/11/2018  . Endometriosis 09/11/2018  . BMI 33.0-33.9,adult 09/11/2018    Past Surgical History:  Procedure Laterality Date  . WISDOM TOOTH EXTRACTION  2011     OB History   No obstetric history on file.     Family History  Problem Relation Age of Onset  . Healthy Mother   . Bipolar disorder Mother        ?Schizophrenia  . Healthy Father     Social History   Tobacco Use  . Smoking status: Never Smoker  . Smokeless tobacco: Never Used  Substance Use Topics  .  Alcohol use: No  . Drug use: No    Home Medications Prior to Admission medications   Medication Sig Start Date End Date Taking? Authorizing Provider  albuterol (VENTOLIN HFA) 108 (90 Base) MCG/ACT inhaler Inhale 2 puffs into the lungs every 4 (four) hours as needed for wheezing or shortness of breath. 02/12/19   Julieanne Manson, MD  drospirenone-ethinyl estradiol (YAZ) 3-0.02 MG tablet Take 1 tablet by mouth daily. 11/24/18   Allayne Stack, DO  fexofenadine (ALLEGRA) 180 MG tablet Take 1 tablet (180 mg total) by mouth daily. 02/12/19   Julieanne Manson, MD  Fluticasone-Salmeterol (ADVAIR DISKUS) 250-50 MCG/DOSE AEPB Inhale 1 puff into the lungs 2 (two) times daily. 02/12/19   Julieanne Manson, MD  ibuprofen (ADVIL) 200 MG tablet Take 200 mg by mouth every 6 (six) hours as needed.    [provider]  ondansetron (ZOFRAN ODT) 4 MG disintegrating tablet 4mg  ODT q4 hours prn nausea/vomit 01/03/19   01/05/19, PA-C  oxyCODONE-acetaminophen (PERCOCET) 5-325 MG tablet Take 1 tablet by mouth every 6 (six) hours as needed. 01/03/19   01/05/19, PA-C  sertraline (ZOLOFT) 50 MG tablet Take 1 tablet (50 mg total) by mouth daily. 02/12/19   04/12/19, MD    Allergies    Codeine  Review of Systems   Review of  Systems  All other systems reviewed and are negative.   Physical Exam Updated Vital Signs BP (!) 144/91 (BP Location: Right Arm)   Pulse 79   Temp 98.3 F (36.8 C) (Oral)   Resp 16   SpO2 95%   Physical Exam Vitals and nursing note reviewed.  Constitutional:      General: She is not in acute distress.    Appearance: She is well-developed.  HENT:     Head: Normocephalic and atraumatic.  Eyes:     Conjunctiva/sclera: Conjunctivae normal.  Cardiovascular:     Rate and Rhythm: Normal rate and regular rhythm.     Heart sounds: No murmur.  Pulmonary:     Effort: Pulmonary effort is normal. No respiratory distress.     Breath sounds: Normal breath  sounds.  Abdominal:     Palpations: Abdomen is soft.     Tenderness: There is abdominal tenderness.  Musculoskeletal:        General: Normal range of motion.     Cervical back: Neck supple.  Skin:    General: Skin is warm and dry.  Neurological:     Mental Status: She is alert and oriented to person, place, and time.  Psychiatric:        Mood and Affect: Mood normal.        Behavior: Behavior normal.     ED Results / Procedures / Treatments   Labs (all labs ordered are listed, but only abnormal results are displayed) Labs Reviewed  COMPREHENSIVE METABOLIC PANEL - Abnormal; Notable for the following components:      Result Value   Glucose, Bld 106 (*)    Creatinine, Ser 1.01 (*)    All other components within normal limits  URINALYSIS, ROUTINE W REFLEX MICROSCOPIC - Abnormal; Notable for the following components:   APPearance HAZY (*)    Hgb urine dipstick LARGE (*)    Leukocytes,Ua TRACE (*)    All other components within normal limits  LIPASE, BLOOD  CBC  I-STAT BETA HCG BLOOD, ED ([facility], [facility], [facility] ONLY)    EKG None  Radiology US Abdomen Limited  Result Date: 03/03/2019 CLINICAL DATA:  Right upper quadrant pain, history of gallstones EXAM: ULTRASOUND ABDOMEN LIMITED RIGHT UPPER QUADRANT COMPARISON:  01/03/2019 FINDINGS: Gallbladder: Large shadowing gallstone again identified measuring 2.4 cm. No gallbladder wall thickening or sonographic evidence of cholecystitis. Gallbladder is relatively decompressed. Common bile duct: Diameter: 4 mm Liver: No focal lesion identified. Within normal limits in parenchymal echogenicity. Portal vein is patent on color Doppler imaging with normal direction of blood flow towards the liver. Other: None. IMPRESSION: 1. Cholelithiasis without cholecystitis. Electronically Signed   By: Sharlet Salina M.D.   On: 03/03/2019 01:07    Procedures Procedures (including critical care time)  Medications Ordered in ED Medications  sodium chloride flush  (NS) 0.9 % injection 3 mL (has no administration in time range)  morphine 4 MG/ML injection 4 mg (has no administration in time range)  ondansetron (ZOFRAN) injection 4 mg (has no administration in time range)    ED Course  I have reviewed the triage vital signs and the nursing notes.  Pertinent labs & imaging results that were available during my care of the patient were reviewed by me and considered in my medical decision making (see chart for details).    MDM Rules/Calculators/A&P                      Patient with known cholelithiasis.  She is here with 3 days of worsening right upper quadrant pain.  She is fairly tender on my exam.  She is afebrile, does not have a leukocytosis, and has normal LFTs.  She has been evaluated several times for the same.  She is attempting to have her gallbladder removed through the orange card program, but has not been selected for surgery yet.  Given 3 days worsening symptoms, will check right upper quadrant ultrasound.  Will treat pain and reassess.  Patient reassessed.  Ultrasound shows cholelithiasis, but no evidence of cholecystitis.  Vital signs are stable.  She is tolerating oral intake.  Recommend that she continue working to be seen by surgery.  I have advised her to return for new or worsening symptoms including fever, or worsening pain.  She understands and agrees with plan.   Final Clinical Impression(s) / ED Diagnoses Final diagnoses:  Calculus of gallbladder without cholecystitis without obstruction    Rx / DC Orders ED Discharge Orders    None       Montine Circle, PA-C 03/03/19 0308    Merryl Hacker, MD 03/03/19 [phone]

## 2019-03-03 NOTE — ED Notes (Signed)
Patient verbalizes understanding of discharge instructions. Opportunity for questioning and answers were provided. Armband removed by staff, pt discharged from ED.  

## 2019-03-10 ENCOUNTER — Ambulatory Visit: Payer: Self-pay | Admitting: Internal Medicine

## 2019-03-28 ENCOUNTER — Emergency Department (HOSPITAL_COMMUNITY)
Admission: EM | Admit: 2019-03-28 | Discharge: 2019-03-29 | Disposition: A | Discharge status: Home / Self Care | Payer: Self-pay | Attending: Emergency Medicine | Admitting: Emergency Medicine

## 2019-03-28 ENCOUNTER — Emergency Department (HOSPITAL_COMMUNITY): Payer: Self-pay

## 2019-03-28 ENCOUNTER — Other Ambulatory Visit: Payer: Self-pay

## 2019-03-28 ENCOUNTER — Encounter (HOSPITAL_COMMUNITY): Payer: Self-pay | Admitting: Emergency Medicine

## 2019-03-28 DIAGNOSIS — Z79899 Other long term (current) drug therapy: Secondary | ICD-10-CM | POA: Insufficient documentation

## 2019-03-28 DIAGNOSIS — K8020 Calculus of gallbladder without cholecystitis without obstruction: Secondary | ICD-10-CM | POA: Insufficient documentation

## 2019-03-28 LAB — URINALYSIS, ROUTINE W REFLEX MICROSCOPIC
Bacteria, UA: NONE SEEN
Bilirubin Urine: NEGATIVE
Glucose, UA: NEGATIVE mg/dL
Hgb urine dipstick: NEGATIVE
Ketones, ur: NEGATIVE mg/dL
Nitrite: NEGATIVE
Protein, ur: NEGATIVE mg/dL
Specific Gravity, Urine: 1.025 (ref 1.005–1.030)
pH: 5 (ref 5.0–8.0)

## 2019-03-28 LAB — COMPREHENSIVE METABOLIC PANEL
ALT: 16 U/L (ref 0–44)
AST: 16 U/L (ref 15–41)
Albumin: 4.1 g/dL (ref 3.5–5.0)
Alkaline Phosphatase: 46 U/L (ref 38–126)
Anion gap: 9 (ref 5–15)
BUN: 12 mg/dL (ref 6–20)
CO2: 23 mmol/L (ref 22–32)
Calcium: 9.1 mg/dL (ref 8.9–10.3)
Chloride: 105 mmol/L (ref 98–111)
Creatinine, Ser: 0.91 mg/dL (ref 0.44–1.00)
GFR calc Af Amer: >60 mL/min (ref >60)
GFR calc non Af Amer: >60 mL/min (ref >60)
Glucose, Bld: 98 mg/dL (ref 70–99)
Potassium: 4.1 mmol/L (ref 3.5–5.1)
Sodium: 137 mmol/L (ref 135–145)
Total Bilirubin: 1.1 mg/dL (ref 0.3–1.2)
Total Protein: 7.6 g/dL (ref 6.5–8.1)

## 2019-03-28 LAB — CBC
HCT: 41.6 % (ref 36.0–46.0)
Hemoglobin: 13.7 g/dL (ref 12.0–15.0)
MCH: 29.9 pg (ref 26.0–34.0)
MCHC: 32.9 g/dL (ref 30.0–36.0)
MCV: 90.8 fL (ref 80.0–100.0)
Platelets: 276 10*3/uL (ref 150–400)
RBC: 4.58 MIL/uL (ref 3.87–5.11)
RDW: 11.9 % (ref 11.5–15.5)
WBC: 6.7 10*3/uL (ref 4.0–10.5)
nRBC: 0 % (ref 0.0–0.2)

## 2019-03-28 LAB — I-STAT BETA HCG BLOOD, ED (MC, WL, AP ONLY): I-stat hCG, quantitative: <5 m[IU]/mL (ref <5)

## 2019-03-28 LAB — LIPASE, BLOOD: Lipase: 24 U/L (ref 11–51)

## 2019-03-28 MED ORDER — SODIUM CHLORIDE 0.9% FLUSH
3.0000 mL | Freq: Once | INTRAVENOUS | Status: AC
  Administered 2019-03-28: 3 mL via INTRAVENOUS

## 2019-03-28 MED ORDER — ONDANSETRON HCL 4 MG/2ML IJ SOLN
4.0000 mg | Freq: Once | INTRAMUSCULAR | Status: AC
  Administered 2019-03-28: 4 mg via INTRAVENOUS
  Filled 2019-03-28: qty 2

## 2019-03-28 MED ORDER — MORPHINE SULFATE (PF) 4 MG/ML IV SOLN
4.0000 mg | Freq: Once | INTRAVENOUS | Status: AC
  Administered 2019-03-28: 4 mg via INTRAVENOUS
  Filled 2019-03-28: qty 1

## 2019-03-28 NOTE — ED Provider Notes (Signed)
[facility] EMERGENCY DEPARTMENT Provider Note   CSN: [id number] Arrival date & time: 03/28/19  2016     History Chief Complaint  Patient presents with  . Abdominal Pain    Carol Marquez is a 29 y.o. female.  Patient presents to the emergency department with a chief complaint of right upper quadrant abdominal pain.  She has history of gallstones.  She states that she has fairly frequent gallbladder attacks.  She is waiting to be chosen for surgery, but does not have insurance, so is using the orange card lottery system.  She denies any fevers or chills.  She states that the pain started today at about 4 PM.  She states that she will get attacks despite changing her diet.  She reports associated nausea and had an episode of vomiting.  She denies any other associated symptoms.  The history is provided by the patient. No language interpreter was used.       Past Medical History:  Diagnosis Date  . Asthma    Diagnosed age 37 or 29 yo.  . Calculus of gallbladder   . Endometriosis   . Fibromyalgia     Patient Active Problem List   Diagnosis Date Noted  . Health care maintenance 09/25/2018  . Chronic motor tic disorder 09/25/2018  . 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 Fibromyalgia 09/11/2018  . Establishing care with new doctor, encounter for 09/11/2018  . Asthma 09/11/2018  . Chronic cholecystitis 09/11/2018  . Endometriosis 09/11/2018  . BMI 33.0-33.9,adult 09/11/2018    Past Surgical History:  Procedure Laterality Date  . WISDOM TOOTH EXTRACTION  2011     OB History   No obstetric history on file.     Family History  Problem Relation Age of Onset  . Healthy Mother   . Bipolar disorder Mother        ?Schizophrenia  . Healthy Father     Social History   Tobacco Use  . Smoking status: Never Smoker  . Smokeless tobacco: Never Used  Substance Use Topics  . Alcohol use: No  . Drug use: No    Home Medications Prior to  Admission medications   Medication Sig Start Date End Date Taking? Authorizing Provider  albuterol (VENTOLIN HFA) 108 (90 Base) MCG/ACT inhaler Inhale 2 puffs into the lungs every 4 (four) hours as needed for wheezing or shortness of breath. 02/12/19   Julieanne Manson, MD  drospirenone-ethinyl estradiol (YAZ) 3-0.02 MG tablet Take 1 tablet by mouth daily. Patient taking differently: Take 1 tablet by mouth at bedtime.  11/24/18   Allayne Stack, DO  fexofenadine (ALLEGRA) 180 MG tablet Take 1 tablet (180 mg total) by mouth daily. 02/12/19   Julieanne Manson, MD  Fluticasone-Salmeterol (ADVAIR DISKUS) 250-50 MCG/DOSE AEPB Inhale 1 puff into the lungs 2 (two) times daily. 02/12/19   Julieanne Manson, MD  HYDROcodone-acetaminophen (NORCO/VICODIN) 5-325 MG tablet Take 1-2 tablets by mouth every 6 (six) hours as needed. 03/03/19   Roxy Horseman, PA-C  ondansetron (ZOFRAN) 4 MG tablet Take 1 tablet (4 mg total) by mouth every 8 (eight) hours as needed for nausea or vomiting. 03/03/19   Roxy Horseman, PA-C  sertraline (ZOLOFT) 50 MG tablet Take 1 tablet (50 mg total) by mouth daily. Patient taking differently: Take 50 mg by mouth at bedtime.  02/12/19   Julieanne Manson, MD    Allergies    Codeine  Review of Systems   Review of Systems  All other systems reviewed and  are negative.   Physical Exam Updated Vital Signs BP (!) 146/75   Pulse 63   Temp 98.5 F (36.9 C) (Oral)   Resp 18   Ht 5\' 9"  (1.753 m)   Wt 100 kg   LMP 03/14/2019   SpO2 100%   BMI 32.56 kg/m   Physical Exam Vitals and nursing note reviewed.  Constitutional:      General: She is not in acute distress.    Appearance: She is well-developed.  HENT:     Head: Normocephalic and atraumatic.  Eyes:     Conjunctiva/sclera: Conjunctivae normal.  Cardiovascular:     Rate and Rhythm: Normal rate.     Heart sounds: No murmur.  Pulmonary:     Effort: Pulmonary effort is normal. No respiratory distress.    Abdominal:     General: There is no distension.     Tenderness: There is abdominal tenderness.     Comments: RUQ tenderness  Musculoskeletal:     Cervical back: Neck supple.     Comments: Moves all extremities  Skin:    General: Skin is warm and dry.  Neurological:     Mental Status: She is alert and oriented to person, place, and time.  Psychiatric:        Mood and Affect: Mood normal.        Behavior: Behavior normal.     ED Results / Procedures / Treatments   Labs (all labs ordered are listed, but only abnormal results are displayed) Labs Reviewed  URINALYSIS, ROUTINE W REFLEX MICROSCOPIC - Abnormal; Notable for the following components:      Result Value   APPearance HAZY (*)    Leukocytes,Ua TRACE (*)    All other components within normal limits  LIPASE, BLOOD  COMPREHENSIVE METABOLIC PANEL  CBC  I-STAT BETA HCG BLOOD, ED ([facility], [facility], [facility] ONLY)    EKG None  Radiology No results found.  Procedures Procedures (including critical care time)  Medications Ordered in ED Medications  morphine 4 MG/ML injection 4 mg (has no administration in time range)  ondansetron (ZOFRAN) injection 4 mg (has no administration in time range)  sodium chloride flush (NS) 0.9 % injection 3 mL (3 mLs Intravenous Given 03/28/19 2229)    ED Course  I have reviewed the triage vital signs and the nursing notes.  Pertinent labs & imaging results that were available during my care of the patient were reviewed by me and considered in my medical decision making (see chart for details).    MDM Rules/Calculators/A&P                       Patient here with RUQ pain.  Hx of gallstones.  Afebrile.  Pain worsened today.  Had an episode of vomiting with some nausea.  Improved here with pain and nausea meds.  US shows stable cholelithiasis, no evidence of cholecystitis.  Recommend outpatient follow-up, discussed that emergent cholecystectomy not indicated with normal labs and [place] and  vitals.  Return precautions discussed.  Final Clinical Impression(s) / ED Diagnoses Final diagnoses:  Symptomatic cholelithiasis    Rx / DC Orders ED Discharge Orders    None       Montine Circle, PA-C 03/29/19 0039    Carmin Muskrat, MD 03/30/19 2359

## 2019-03-28 NOTE — ED Triage Notes (Signed)
Patient reports RUQ abdominal pain this afternoon with emesis , denies fever or diarrhea .

## 2019-03-29 MED ORDER — MORPHINE SULFATE (PF) 4 MG/ML IV SOLN
4.0000 mg | Freq: Once | INTRAVENOUS | Status: DC
  Filled 2019-03-29: qty 1

## 2019-03-29 MED ORDER — ONDANSETRON HCL 4 MG/2ML IJ SOLN
4.0000 mg | Freq: Once | INTRAMUSCULAR | Status: AC
  Administered 2019-03-29: 4 mg via INTRAVENOUS
  Filled 2019-03-29: qty 2

## 2019-03-29 MED ORDER — HYDROCODONE-ACETAMINOPHEN 5-325 MG PO TABS: 1.0000 | ORAL_TABLET | Freq: Four times a day (QID) | ORAL | 0 refills | Status: DC | PRN

## 2019-03-29 MED ORDER — ONDANSETRON HCL 4 MG PO TABS: 4.0000 mg | ORAL_TABLET | Freq: Three times a day (TID) | ORAL | 0 refills | Status: DC | PRN

## 2019-03-29 NOTE — ED Notes (Signed)
Patient verbalizes understanding of discharge instructions. Opportunity for questioning and answers were provided. Armband removed by staff, pt discharged from ED. Pt. ambulatory and discharged home.  

## 2019-04-03 ENCOUNTER — Ambulatory Visit: Payer: Self-pay | Attending: Internal Medicine

## 2019-04-03 DIAGNOSIS — Z23 Encounter for immunization: Secondary | ICD-10-CM

## 2019-04-03 NOTE — Progress Notes (Signed)
   Covid-19 Vaccination Clinic  Name:  Carol Marquez    MRN: [id number] DOB: [birth year]  04/03/2019  Ms. [name] was observed post Covid-19 immunization for 15 minutes without incident. She was provided with Vaccine Information Sheet and instruction to access the V-Safe system.   Ms. [name] was instructed to call 911 with any severe reactions post vaccine: Marland Kitchen Difficulty breathing  . Swelling of face and throat  . A fast heartbeat  . A bad rash all over body  . Dizziness and weakness   Immunizations Administered    Name Date Dose VIS Date Route   Pfizer COVID-19 Vaccine 04/03/2019 12:14 PM 0.3 mL 12/19/2018 Intramuscular   Manufacturer: [employer], [employer]   Lot: [id number]   NDC: [address]-8

## 2019-04-08 DIAGNOSIS — E669 Obesity, unspecified: Secondary | ICD-10-CM | POA: Insufficient documentation

## 2019-04-08 DIAGNOSIS — K8020 Calculus of gallbladder without cholecystitis without obstruction: Secondary | ICD-10-CM | POA: Insufficient documentation

## 2019-04-08 DIAGNOSIS — F329 Major depressive disorder, single episode, unspecified: Secondary | ICD-10-CM | POA: Insufficient documentation

## 2019-04-08 DIAGNOSIS — J302 Other seasonal allergic rhinitis: Secondary | ICD-10-CM | POA: Insufficient documentation

## 2019-04-08 DIAGNOSIS — F32A Depression, unspecified: Secondary | ICD-10-CM | POA: Insufficient documentation

## 2019-04-13 ENCOUNTER — Ambulatory Visit: Payer: Self-pay | Admitting: Internal Medicine

## 2019-04-17 ENCOUNTER — Emergency Department (HOSPITAL_COMMUNITY)
Admission: EM | Admit: 2019-04-17 | Discharge: 2019-04-17 | Disposition: A | Discharge status: Home / Self Care | Payer: Self-pay | Attending: Emergency Medicine | Admitting: Emergency Medicine

## 2019-04-17 ENCOUNTER — Other Ambulatory Visit: Payer: Self-pay

## 2019-04-17 DIAGNOSIS — R109 Unspecified abdominal pain: Secondary | ICD-10-CM | POA: Insufficient documentation

## 2019-04-17 DIAGNOSIS — Z5321 Procedure and treatment not carried out due to patient leaving prior to being seen by health care provider: Secondary | ICD-10-CM | POA: Insufficient documentation

## 2019-04-17 LAB — COMPREHENSIVE METABOLIC PANEL
ALT: 13 U/L (ref 0–44)
AST: 14 U/L — ABNORMAL LOW (ref 15–41)
Albumin: 3.7 g/dL (ref 3.5–5.0)
Alkaline Phosphatase: 42 U/L (ref 38–126)
Anion gap: 11 (ref 5–15)
BUN: 14 mg/dL (ref 6–20)
CO2: 24 mmol/L (ref 22–32)
Calcium: 9.3 mg/dL (ref 8.9–10.3)
Chloride: 104 mmol/L (ref 98–111)
Creatinine, Ser: 0.8 mg/dL (ref 0.44–1.00)
GFR calc Af Amer: >60 mL/min (ref >60)
GFR calc non Af Amer: >60 mL/min (ref >60)
Glucose, Bld: 123 mg/dL — ABNORMAL HIGH (ref 70–99)
Potassium: 3.8 mmol/L (ref 3.5–5.1)
Sodium: 139 mmol/L (ref 135–145)
Total Bilirubin: 0.7 mg/dL (ref 0.3–1.2)
Total Protein: 6.9 g/dL (ref 6.5–8.1)

## 2019-04-17 LAB — CBC
HCT: 41.3 % (ref 36.0–46.0)
Hemoglobin: 13.7 g/dL (ref 12.0–15.0)
MCH: 29.9 pg (ref 26.0–34.0)
MCHC: 33.2 g/dL (ref 30.0–36.0)
MCV: 90.2 fL (ref 80.0–100.0)
Platelets: 271 10*3/uL (ref 150–400)
RBC: 4.58 MIL/uL (ref 3.87–5.11)
RDW: 11.9 % (ref 11.5–15.5)
WBC: 7.2 10*3/uL (ref 4.0–10.5)
nRBC: 0 % (ref 0.0–0.2)

## 2019-04-17 LAB — I-STAT BETA HCG BLOOD, ED (MC, WL, AP ONLY): I-stat hCG, quantitative: <5 m[IU]/mL (ref <5)

## 2019-04-17 LAB — LIPASE, BLOOD: Lipase: 30 U/L (ref 11–51)

## 2019-04-17 NOTE — ED Triage Notes (Signed)
Pt c/o abd pain and has known gall stones. Was advised that she needs surgery and is awaiting a date.

## 2019-04-17 NOTE — ED Notes (Signed)
Pt leaving AMA. Pt stated she will return if symptoms worsen.

## 2019-04-29 ENCOUNTER — Ambulatory Visit: Payer: Self-pay | Attending: Internal Medicine

## 2019-04-29 DIAGNOSIS — Z23 Encounter for immunization: Secondary | ICD-10-CM

## 2019-04-29 NOTE — Progress Notes (Signed)
   Covid-19 Vaccination Clinic  Name:  Carol Marquez    MRN: [id number] DOB: [birth year]  04/29/2019  [name] was observed post Covid-19 immunization for 15 minutes without incident. She was provided with Vaccine Information Sheet and instruction to access the V-Safe system.   [name] was instructed to call 911 with any severe reactions post vaccine: Marland Kitchen Difficulty breathing  . Swelling of face and throat  . A fast heartbeat  . A bad rash all over body  . Dizziness and weakness   Immunizations Administered    Name Date Dose VIS Date Route   Pfizer COVID-19 Vaccine 04/29/2019  8:54 AM 0.3 mL 03/04/2018 Intramuscular   Manufacturer: [employer], [employer]   Lot: [id number]   NDC: [address]-6

## 2019-05-05 ENCOUNTER — Ambulatory Visit: Payer: Self-pay | Admitting: Internal Medicine

## 2019-06-10 ENCOUNTER — Ambulatory Visit: Payer: Self-pay | Admitting: Internal Medicine

## 2019-06-22 ENCOUNTER — Encounter (HOSPITAL_COMMUNITY): Payer: Self-pay

## 2019-06-22 ENCOUNTER — Ambulatory Visit (HOSPITAL_COMMUNITY)
Admission: EM | Admit: 2019-06-22 | Discharge: 2019-06-22 | Disposition: A | Discharge status: Home / Self Care | Payer: Self-pay | Attending: Family Medicine | Admitting: Family Medicine

## 2019-06-22 ENCOUNTER — Other Ambulatory Visit: Payer: Self-pay

## 2019-06-22 DIAGNOSIS — Z202 Contact with and (suspected) exposure to infections with a predominantly sexual mode of transmission: Secondary | ICD-10-CM | POA: Insufficient documentation

## 2019-06-22 LAB — HIV ANTIBODY (ROUTINE TESTING W REFLEX): HIV Screen 4th Generation wRfx: NONREACTIVE

## 2019-06-22 NOTE — ED Provider Notes (Signed)
[place]    CSN: [id number] Arrival date & time: 06/22/19  1247      History   Chief Complaint Chief Complaint  Patient presents with  . SEXUALLY TRANSMITTED DISEASE    HPI Carol Marquez is a 29 y.o. female.   HPI   Wants STD testing No symptoms No definite exposure Wants to "be sure" States is healthy  Past Medical History:  Diagnosis Date  . Asthma    Diagnosed age 61 or 29 yo.  . Calculus of gallbladder   . Endometriosis   . Fibromyalgia     Patient Active Problem List   Diagnosis Date Noted  . Calculus of gallbladder without cholecystitis without obstruction 04/08/2019  . Depression 04/08/2019  . Seasonal allergies 04/08/2019  . Class 2 obesity with body mass index (BMI) of 35.0 to 35.9 in adult 04/08/2019  . Health care maintenance 09/25/2018  . Chronic motor tic disorder 09/25/2018  . Moderate major depression ([place]) 09/25/2018  . Fibromyalgia 09/11/2018  . Establishing care with new doctor, encounter for 09/11/2018  . Asthma 09/11/2018  . Chronic cholecystitis 09/11/2018  . Endometriosis 09/11/2018  . BMI 33.0-33.9,adult 09/11/2018    Past Surgical History:  Procedure Laterality Date  . WISDOM TOOTH EXTRACTION  2011    OB History   No obstetric history on file.      Home Medications    Prior to Admission medications   Medication Sig Start Date End Date Taking? Authorizing Provider  albuterol (VENTOLIN HFA) 108 (90 Base) MCG/ACT inhaler Inhale 2 puffs into the lungs every 4 (four) hours as needed for wheezing or shortness of breath. 02/12/19   Mack Hook, MD  drospirenone-ethinyl estradiol (YAZ) 3-0.02 MG tablet Take 1 tablet by mouth daily. Patient taking differently: Take 1 tablet by mouth at bedtime.  11/24/18   Patriciaann Clan, DO  fexofenadine (ALLEGRA) 180 MG tablet Take 1 tablet (180 mg total) by mouth daily. 02/12/19   Mack Hook, MD  Fluticasone-Salmeterol (ADVAIR DISKUS) 250-50 MCG/DOSE AEPB Inhale 1  puff into the lungs 2 (two) times daily. 02/12/19   Mack Hook, MD  HYDROcodone-acetaminophen (NORCO/VICODIN) 5-325 MG tablet Take 1-2 tablets by mouth every 6 (six) hours as needed. 03/29/19   Montine Circle, PA-C  ondansetron (ZOFRAN) 4 MG tablet Take 1 tablet (4 mg total) by mouth every 8 (eight) hours as needed for nausea or vomiting. 03/29/19   Montine Circle, PA-C  sertraline (ZOLOFT) 50 MG tablet Take 1 tablet (50 mg total) by mouth daily. Patient taking differently: Take 50 mg by mouth at bedtime.  02/12/19   Mack Hook, MD    Family History Family History  Problem Relation Age of Onset  . Healthy Mother   . Bipolar disorder Mother        ?Schizophrenia  . Healthy Father     Social History Social History   Tobacco Use  . Smoking status: Never Smoker  . Smokeless tobacco: Never Used  Vaping Use  . Vaping Use: Never used  Substance Use Topics  . Alcohol use: No  . Drug use: No     Allergies   Codeine   Review of Systems Review of Systems   Physical Exam Triage Vital Signs ED Triage Vitals  Enc Vitals Group     BP 06/22/19 1312 (!) 144/91     Pulse Rate 06/22/19 1312 90     Resp 06/22/19 1312 14     Temp 06/22/19 1312 97.9 F (36.6 C)  Temp Source 06/22/19 1312 Oral     SpO2 06/22/19 1312 98 %     Weight --      Height --      Head Circumference --      Peak Flow --      Pain Score 06/22/19 1308 0     Pain Loc --      Pain Edu? --      Excl. in GC? --    No data found.  Updated Vital Signs BP (!) 144/91 (BP Location: Right Arm)   Pulse 90   Temp 97.9 F (36.6 C) (Oral)   Resp 14   SpO2 98%   Visual Acuity Right Eye Distance:   Left Eye Distance:   Bilateral Distance:    Right Eye Near:   Left Eye Near:    Bilateral Near:     Physical Exam   UC Treatments / Results  Labs (all labs ordered are listed, but only abnormal results are displayed) Labs Reviewed  RPR  HIV ANTIBODY (ROUTINE TESTING W REFLEX)    CERVICOVAGINAL ANCILLARY ONLY    EKG   Radiology No results found.  Procedures Procedures (including critical care time)  Medications Ordered in UC Medications - No data to display  Initial Impression / Assessment and Plan / UC Course  I have reviewed the triage vital signs and the nursing notes.  Pertinent labs & imaging results that were available during my care of the patient were reviewed by me and considered in my medical decision making (see chart for details).     Reviewed with patient how to get her test results.  Explained that she will be called if any test results are positive. Final Clinical Impressions(s) / UC Diagnoses   Final diagnoses:  Possible exposure to STD     Discharge Instructions     Check for your test results on My Chart   ED Prescriptions    None     PDMP not reviewed this encounter.   Eustace Moore, MD 06/22/19 [phone]

## 2019-06-22 NOTE — ED Triage Notes (Signed)
Patient here requesting STD checkup. Denies symptoms or known exposure.

## 2019-06-22 NOTE — Discharge Instructions (Addendum)
Check for your test results on My Chart. 

## 2019-06-23 ENCOUNTER — Telehealth: Payer: Self-pay | Admitting: Internal Medicine

## 2019-06-23 LAB — CERVICOVAGINAL ANCILLARY ONLY
Chlamydia: NEGATIVE
Comment: NEGATIVE
Comment: NEGATIVE
Comment: NORMAL
Neisseria Gonorrhea: NEGATIVE
Trichomonas: NEGATIVE

## 2019-06-23 LAB — RPR: RPR Ser Ql: NONREACTIVE

## 2019-06-23 NOTE — Telephone Encounter (Signed)
[name] from [name] sent an email today informing that [name] informed them that patient has a balance that would need to be taken care of before she is able to be scheduled with them. Called patient and left a detailed voicemail with this information and advising patient to call us back to confirm recipient.  While I was typing this message patient returned call stating message was received and that will be taking care of that tomorrow and that will gives up a call back when everything is done.

## 2019-06-26 ENCOUNTER — Emergency Department (HOSPITAL_COMMUNITY)
Admission: EM | Admit: 2019-06-26 | Discharge: 2019-06-27 | Disposition: A | Discharge status: Home / Self Care | Payer: Self-pay | Attending: Emergency Medicine | Admitting: Emergency Medicine

## 2019-06-26 ENCOUNTER — Encounter (HOSPITAL_COMMUNITY): Payer: Self-pay

## 2019-06-26 DIAGNOSIS — Z79899 Other long term (current) drug therapy: Secondary | ICD-10-CM | POA: Insufficient documentation

## 2019-06-26 DIAGNOSIS — Z6829 Body mass index (BMI) 29.0-29.9, adult: Secondary | ICD-10-CM | POA: Insufficient documentation

## 2019-06-26 DIAGNOSIS — R112 Nausea with vomiting, unspecified: Secondary | ICD-10-CM | POA: Insufficient documentation

## 2019-06-26 DIAGNOSIS — E669 Obesity, unspecified: Secondary | ICD-10-CM | POA: Insufficient documentation

## 2019-06-26 DIAGNOSIS — K8050 Calculus of bile duct without cholangitis or cholecystitis without obstruction: Secondary | ICD-10-CM | POA: Insufficient documentation

## 2019-06-26 LAB — URINALYSIS, ROUTINE W REFLEX MICROSCOPIC
Bilirubin Urine: NEGATIVE
Glucose, UA: NEGATIVE mg/dL
Hgb urine dipstick: NEGATIVE
Ketones, ur: NEGATIVE mg/dL
Nitrite: NEGATIVE
Protein, ur: 30 mg/dL — AB
Specific Gravity, Urine: 1.034 — ABNORMAL HIGH (ref 1.005–1.030)
pH: 5 (ref 5.0–8.0)

## 2019-06-26 LAB — CBC
HCT: 41.4 % (ref 36.0–46.0)
Hemoglobin: 13.7 g/dL (ref 12.0–15.0)
MCH: 29.8 pg (ref 26.0–34.0)
MCHC: 33.1 g/dL (ref 30.0–36.0)
MCV: 90 fL (ref 80.0–100.0)
Platelets: 260 10*3/uL (ref 150–400)
RBC: 4.6 MIL/uL (ref 3.87–5.11)
RDW: 11.8 % (ref 11.5–15.5)
WBC: 6.7 10*3/uL (ref 4.0–10.5)
nRBC: 0 % (ref 0.0–0.2)

## 2019-06-26 LAB — COMPREHENSIVE METABOLIC PANEL
ALT: 19 U/L (ref 0–44)
AST: 15 U/L (ref 15–41)
Albumin: 4.1 g/dL (ref 3.5–5.0)
Alkaline Phosphatase: 45 U/L (ref 38–126)
Anion gap: 10 (ref 5–15)
BUN: 16 mg/dL (ref 6–20)
CO2: 23 mmol/L (ref 22–32)
Calcium: 9.1 mg/dL (ref 8.9–10.3)
Chloride: 105 mmol/L (ref 98–111)
Creatinine, Ser: 0.9 mg/dL (ref 0.44–1.00)
GFR calc Af Amer: >60 mL/min (ref >60)
GFR calc non Af Amer: >60 mL/min (ref >60)
Glucose, Bld: 92 mg/dL (ref 70–99)
Potassium: 4.3 mmol/L (ref 3.5–5.1)
Sodium: 138 mmol/L (ref 135–145)
Total Bilirubin: 0.8 mg/dL (ref 0.3–1.2)
Total Protein: 7.6 g/dL (ref 6.5–8.1)

## 2019-06-26 LAB — LIPASE, BLOOD: Lipase: 26 U/L (ref 11–51)

## 2019-06-26 LAB — I-STAT BETA HCG BLOOD, ED (MC, WL, AP ONLY): I-stat hCG, quantitative: <5 m[IU]/mL (ref <5)

## 2019-06-26 MED ORDER — ONDANSETRON 4 MG PO TBDP
4.0000 mg | ORAL_TABLET | Freq: Once | ORAL | Status: AC
  Administered 2019-06-26: 4 mg via ORAL
  Filled 2019-06-26: qty 1

## 2019-06-26 MED ORDER — OXYCODONE-ACETAMINOPHEN 5-325 MG PO TABS
1.0000 | ORAL_TABLET | ORAL | Status: DC | PRN
  Administered 2019-06-26: 1 via ORAL
  Filled 2019-06-26: qty 1

## 2019-06-26 NOTE — ED Triage Notes (Signed)
Pt arrives POV for nausea/vomiting w/ RUQ abd pain onset around 1300. Reports known hx of gallstones/gallbladder pain.

## 2019-06-27 ENCOUNTER — Other Ambulatory Visit: Payer: Self-pay

## 2019-06-27 MED ORDER — MORPHINE SULFATE (PF) 4 MG/ML IV SOLN
4.0000 mg | Freq: Once | INTRAVENOUS | Status: AC
  Administered 2019-06-27: 4 mg via INTRAVENOUS
  Filled 2019-06-27 (×2): qty 1

## 2019-06-27 MED ORDER — HYDROCODONE-ACETAMINOPHEN 5-325 MG PO TABS: 2.0000 | ORAL_TABLET | ORAL | 0 refills | Status: DC | PRN

## 2019-06-27 MED ORDER — METOCLOPRAMIDE HCL 10 MG PO TABS: 10.0000 mg | ORAL_TABLET | Freq: Three times a day (TID) | ORAL | 0 refills | Status: DC | PRN

## 2019-06-27 MED ORDER — ONDANSETRON HCL 4 MG/2ML IJ SOLN
4.0000 mg | Freq: Once | INTRAMUSCULAR | Status: AC
  Administered 2019-06-27: 4 mg via INTRAVENOUS
  Filled 2019-06-27 (×2): qty 2

## 2019-06-27 MED ORDER — SODIUM CHLORIDE 0.9 % IV BOLUS
500.0000 mL | Freq: Once | INTRAVENOUS | Status: AC
  Administered 2019-06-27: 500 mL via INTRAVENOUS

## 2019-06-27 NOTE — ED Provider Notes (Signed)
[facility] EMERGENCY DEPARTMENT Provider Note   CSN: [id number] Arrival date & time: 06/26/19  1750     History Chief Complaint  Patient presents with  . Abdominal Pain    Carol Marquez is a 29 y.o. female presenting for evaluation of abdominal pain.  Patient states she has had intermittent right upper quadrant abdominal pain for the past several days.  Today, her pain has been constant and more severe.  She reports associated nausea and vomiting, reports 6 episodes of emesis.  It is nonbloody.  She states this is consistent with previous biliary colic.  She is working to follow-up with general surgery, but has not been able to do so yet.  She has been taking Tylenol and ibuprofen without improvement of her symptoms.  She is not try anything else.  She has fevers, chills, chest pain, shortness of breath, urinary symptoms, abnormal bowel movements.  Additional history obtained from chart review.  Patient has been seen multiple times for abdominal pain with recurrent gallstones found on ultrasound.   HPI     Past Medical History:  Diagnosis Date  . Asthma    Diagnosed age 89 or 29 yo.  . Calculus of gallbladder   . Endometriosis   . Fibromyalgia     Patient Active Problem List   Diagnosis Date Noted  . Calculus of gallbladder without cholecystitis without obstruction 04/08/2019  . Depression 04/08/2019  . Seasonal allergies 04/08/2019  . Class 2 obesity with body mass index (BMI) of 35.0 to 35.9 in adult 04/08/2019  . Health care maintenance 09/25/2018  . Chronic motor tic disorder 09/25/2018  . 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 Fibromyalgia 09/11/2018  . Establishing care with new doctor, encounter for 09/11/2018  . Asthma 09/11/2018  . Chronic cholecystitis 09/11/2018  . Endometriosis 09/11/2018  . BMI 33.0-33.9,adult 09/11/2018    Past Surgical History:  Procedure Laterality Date  . WISDOM TOOTH EXTRACTION  2011     OB History     No obstetric history on file.     Family History  Problem Relation Age of Onset  . Healthy Mother   . Bipolar disorder Mother        ?Schizophrenia  . Healthy Father     Social History   Tobacco Use  . Smoking status: Never Smoker  . Smokeless tobacco: Never Used  Vaping Use  . Vaping Use: Never used  Substance Use Topics  . Alcohol use: No  . Drug use: No    Home Medications Prior to Admission medications   Medication Sig Start Date End Date Taking? Authorizing Provider  albuterol (VENTOLIN HFA) 108 (90 Base) MCG/ACT inhaler Inhale 2 puffs into the lungs every 4 (four) hours as needed for wheezing or shortness of breath. 02/12/19  Yes Julieanne Manson, MD  Cranberry-Vitamin C [facility] CONCENTRATE/VITAMINC PO) Take 2 capsules by mouth daily.   Yes [provider]  diphenhydrAMINE (BENADRYL) 25 MG tablet Take 50 mg by mouth daily as needed for itching or allergies.   Yes [provider]  drospirenone-ethinyl estradiol (YAZ) 3-0.02 MG tablet Take 1 tablet by mouth daily. Patient taking differently: Take 1 tablet by mouth at bedtime.  11/24/18  Yes Leticia Penna N, DO  ibuprofen (ADVIL) 200 MG tablet Take 1,000 mg by mouth daily as needed for moderate pain.   Yes [provider]  Multiple Vitamins-Minerals (HAIR SKIN AND NAILS FORMULA) TABS Take 1 tablet by mouth daily.   Yes [provider]  sertraline (ZOLOFT) 50 MG tablet Take 1 tablet (50 mg total) by mouth daily. Patient taking differently: Take 50 mg by mouth at bedtime.  02/12/19  Yes Julieanne Manson, MD  fexofenadine (ALLEGRA) 180 MG tablet Take 1 tablet (180 mg total) by mouth daily. Patient not taking: Reported on 06/27/2019 02/12/19   Julieanne Manson, MD  Fluticasone-Salmeterol (ADVAIR DISKUS) 250-50 MCG/DOSE AEPB Inhale 1 puff into the lungs 2 (two) times daily. Patient not taking: Reported on 06/27/2019 02/12/19   Julieanne Manson, MD  HYDROcodone-acetaminophen  (NORCO/VICODIN) 5-325 MG tablet Take 2 tablets by mouth every 4 (four) hours as needed. 06/27/19   Ismar Yabut, PA-C  metoCLOPramide (REGLAN) 10 MG tablet Take 1 tablet (10 mg total) by mouth every 8 (eight) hours as needed for nausea. 06/27/19   Dusan Lipford, PA-C  ondansetron (ZOFRAN) 4 MG tablet Take 1 tablet (4 mg total) by mouth every 8 (eight) hours as needed for nausea or vomiting. Patient not taking: Reported on 06/27/2019 03/29/19   Roxy Horseman, PA-C    Allergies    Shellfish allergy and Codeine  Review of Systems   Review of Systems  Gastrointestinal: Positive for abdominal pain, nausea and vomiting.  All other systems reviewed and are negative.   Physical Exam Updated Vital Signs BP 130/86 (BP Location: Right Arm)   Pulse 81   Temp 97.6 F (36.4 C) (Oral)   Resp 14   Ht 5\' 9"  (1.753 m)   Wt 90.7 kg   SpO2 96%   BMI 29.53 kg/m   Physical Exam Vitals and nursing note reviewed.  Constitutional:      General: She is not in acute distress.    Appearance: She is well-developed.     Comments: Resting in the bed in no acute distress  HENT:     Head: Normocephalic and atraumatic.  Eyes:     Conjunctiva/sclera: Conjunctivae normal.     Pupils: Pupils are equal, round, and reactive to light.  Cardiovascular:     Rate and Rhythm: Normal rate and regular rhythm.     Pulses: Normal pulses.  Pulmonary:     Effort: Pulmonary effort is normal. No respiratory distress.     Breath sounds: Normal breath sounds. No wheezing.  Abdominal:     General: Bowel sounds are normal. There is no distension.     Palpations: Abdomen is soft.     Tenderness: There is abdominal tenderness in the right upper quadrant.     Comments: Tenderness palpation of right upper quadrant abdomen.  No rigidity, guarding, distention.  Negative rebound.  No peritonitis.  Negative Murphy's.  Musculoskeletal:        General: Normal range of motion.     Cervical back: Normal range of motion and  neck supple.  Skin:    General: Skin is warm and dry.     Capillary Refill: Capillary refill takes less than 2 seconds.  Neurological:     Mental Status: She is alert and oriented to person, place, and time.     ED Results / Procedures / Treatments   Labs (all labs ordered are listed, but only abnormal results are displayed) Labs Reviewed  URINALYSIS, ROUTINE W REFLEX MICROSCOPIC - Abnormal; Notable for the following components:      Result Value   Specific Gravity, Urine 1.034 (*)    Protein, ur 30 (*)    Leukocytes,Ua SMALL (*)    Bacteria, UA RARE (*)    All other components within normal limits  LIPASE,  BLOOD  COMPREHENSIVE METABOLIC PANEL  CBC  I-STAT BETA HCG BLOOD, ED ([facility], [facility], [facility] ONLY)    EKG None  Radiology No results found.  Procedures Procedures (including critical care time)  Medications Ordered in ED Medications  oxyCODONE-acetaminophen (PERCOCET/ROXICET) 5-325 MG per tablet 1 tablet (1 tablet Oral Given 06/26/19 1801)  ondansetron (ZOFRAN-ODT) disintegrating tablet 4 mg (4 mg Oral Given 06/26/19 1801)  morphine 4 MG/ML injection 4 mg (4 mg Intravenous Given 06/27/19 0359)  sodium chloride 0.9 % bolus 500 mL (0 mLs Intravenous Stopped 06/27/19 0604)  ondansetron (ZOFRAN) injection 4 mg (4 mg Intravenous Given 06/27/19 0400)    ED Course  I have reviewed the triage vital signs and the nursing notes.  Pertinent labs & imaging results that were available during my care of the patient were reviewed by me and considered in my medical decision making (see chart for details).    MDM Rules/Calculators/A&P                          Patient presenting for evaluation of right upper quadrant abdominal pain.  On exam, patient appears nontoxic.  She does have tenderness palpation of the right upper quadrant, with associated nausea and vomiting.  However she is without fever, and labs obtained from triage showed no leukocytosis or elevation of LFTs.  As such, low suspicion  for cholecystitis.  Likely cholelithiasis.  Urine without infection.  Lipase is normal discussed findings with patient.  Discussed option of repeat ultrasound today, but overload in the setting of lack of fever and normal blood work, low suspicion for infection.  Discussed alternative option of symptomatic treatment and follow-up with surgery outpatient.  Patient elects to not have the ultrasound today.  On reassessment after symptomatic treatment, patient reports improvement of her nausea and pain.  Will p.o. challenge.  Patient tolerated p.o. without difficulty.  No significant pain after p.o.  Discussed importance of follow-up with general surgery.  Will give short course of pain control as well as Reglan per patient request.  At this time, patient appears safe for discharge.  Return precautions given.  Patient states she understands and agrees to plan.  Final Clinical Impression(s) / ED Diagnoses Final diagnoses:  Biliary colic    Rx / DC Orders ED Discharge Orders         Ordered    metoCLOPramide (REGLAN) 10 MG tablet  Every 8 hours PRN,   Status:  Discontinued     Reprint     06/27/19 0554    HYDROcodone-acetaminophen (NORCO/VICODIN) 5-325 MG tablet  Every 4 hours PRN     Discontinue  Reprint     06/27/19 0554    metoCLOPramide (REGLAN) 10 MG tablet  Every 8 hours PRN     Discontinue  Reprint     06/27/19 [place], Shayma Pfefferle, PA-C 06/27/19 1610    Ripley Fraise, MD 06/27/19 [phone]

## 2019-06-27 NOTE — Discharge Instructions (Signed)
Use Tylenol and ibuprofen as needed for mild to moderate pain.  Use Norco as needed for severe breakthrough pain.  Have caution, this may make you tired or groggy.  Do not drive or operate heavy machinery while taking this medicine. Use Reglan as needed for nausea or vomiting. Eat a low-fat diet to decrease gallbladder symptoms. Follow-up with the general surgery office listed below for further management of your gallbladder. Return to the emergency room if you develop fevers, severe worsening pain, persistent vomiting, or any new, worsening, or concerning symptoms.

## 2019-07-25 ENCOUNTER — Ambulatory Visit (INDEPENDENT_AMBULATORY_CARE_PROVIDER_SITE_OTHER): Payer: Self-pay

## 2019-07-25 ENCOUNTER — Ambulatory Visit (HOSPITAL_COMMUNITY)
Admission: EM | Admit: 2019-07-25 | Discharge: 2019-07-25 | Disposition: A | Discharge status: Home / Self Care | Payer: Self-pay | Attending: Emergency Medicine | Admitting: Emergency Medicine

## 2019-07-25 ENCOUNTER — Encounter (HOSPITAL_COMMUNITY): Payer: Self-pay

## 2019-07-25 DIAGNOSIS — Z3202 Encounter for pregnancy test, result negative: Secondary | ICD-10-CM

## 2019-07-25 DIAGNOSIS — S39012A Strain of muscle, fascia and tendon of lower back, initial encounter: Secondary | ICD-10-CM

## 2019-07-25 DIAGNOSIS — M5440 Lumbago with sciatica, unspecified side: Secondary | ICD-10-CM

## 2019-07-25 DIAGNOSIS — M5431 Sciatica, right side: Secondary | ICD-10-CM

## 2019-07-25 DIAGNOSIS — M549 Dorsalgia, unspecified: Secondary | ICD-10-CM

## 2019-07-25 LAB — POC URINE PREG, ED: Preg Test, Ur: NEGATIVE

## 2019-07-25 MED ORDER — PREDNISONE 10 MG (21) PO TBPK: ORAL_TABLET | ORAL | 0 refills | Status: DC

## 2019-07-25 MED ORDER — CYCLOBENZAPRINE HCL 5 MG PO TABS: ORAL_TABLET | ORAL | 0 refills | Status: DC

## 2019-07-25 MED ORDER — MELOXICAM 7.5 MG PO TABS: ORAL_TABLET | ORAL | 0 refills | Status: DC

## 2019-07-25 NOTE — ED Triage Notes (Signed)
Pt c/o 7/10 sharp pain in lower backx2 wks. Pt states her pain radiates down to her hips bilat. Pt states she may have injured it doing exercises, holding a bridge pose too long. Pt was bent over walking to exam room. Pt denies numbness and tingling. Pt denies loss of bowel or bladder or any urinary issues.

## 2019-07-25 NOTE — Discharge Instructions (Signed)
Your x-ray of lumbar spine is normal.  Giving you a copy of the report. You likely have a either a severe musculoligamentous low back strain, or possibly sciatica with pinched nerve in the right low back.  Prescription sent to your pharmacy for meloxicam for pain, muscle relaxant, as well as prednisone Dosepak, which may very well help the pain if it is from a pinched nerve. Rest.  Heat.  You need to follow-up with neurosurgeon or orthopedist if pain persist for more than a week.  If any severe worsening symptoms, go to emergency room immediately. Please read attached instruction sheets on sciatica and lumbosacral strain. To help prevent GI side effects from meloxicam and prednisone, you may also take OTC Pepcid while you are taking both of them.

## 2019-07-25 NOTE — ED Provider Notes (Signed)
[facility]    CSN: [id number] Arrival date & time: 07/25/19  1201      History   Chief Complaint Chief Complaint  Patient presents with  . Back Pain    HPI Carol Marquez is a 29 y.o. female.   HPI Pt c/o 7/10 sharp pain in lower backx2 wks. Midline lumbar pain and R paralumbar pain/tenderness , worse with right straight leg raise.Pt states her pain radiates down to right hip and buttock.  No radiation beyond right thigh.  Pt states she may have injured it doing exercises, holding a bridge pose too long.  Pt denies numbness and tingling. Pt denies loss of bowel or bladder or any urinary issues.  She never had any back problems in the past but there is a family history, parents have degenerative disc disease.  Pertinent items noted in HPI and remainder of comprehensive ROS otherwise negative.   Past Medical History:  Diagnosis Date  . Asthma    Diagnosed age 25 or 29 yo.  . Calculus of gallbladder   . Endometriosis   . Fibromyalgia     Patient Active Problem List   Diagnosis Date Noted  . Calculus of gallbladder without cholecystitis without obstruction 04/08/2019  . Depression 04/08/2019  . Seasonal allergies 04/08/2019  . Class 2 obesity with body mass index (BMI) of 35.0 to 35.9 in adult 04/08/2019  . Health care maintenance 09/25/2018  . Chronic motor tic disorder 09/25/2018  . 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 Fibromyalgia 09/11/2018  . Establishing care with new doctor, encounter for 09/11/2018  . Asthma 09/11/2018  . Chronic cholecystitis 09/11/2018  . Endometriosis 09/11/2018  . BMI 33.0-33.9,adult 09/11/2018    Past Surgical History:  Procedure Laterality Date  . WISDOM TOOTH EXTRACTION  2011    OB History   No obstetric history on file.      Home Medications    Prior to Admission medications   Medication Sig Start Date End Date Taking? Authorizing Provider  albuterol (VENTOLIN HFA) 108 (90 Base) MCG/ACT inhaler  Inhale 2 puffs into the lungs every 4 (four) hours as needed for wheezing or shortness of breath. 02/12/19   Julieanne Manson, MD  Cranberry-Vitamin C [facility] CONCENTRATE/VITAMINC PO) Take 2 capsules by mouth daily.    [provider]  cyclobenzaprine (FLEXERIL) 5 MG tablet Take 1 tablet up to 3 times a day as needed for muscle relaxant.-If needed, may take 2 tablets at night.-Caution, may cause drowsiness. 07/25/19   Lajean Manes, MD  diphenhydrAMINE (BENADRYL) 25 MG tablet Take 50 mg by mouth daily as needed for itching or allergies.    [provider]  drospirenone-ethinyl estradiol (YAZ) 3-0.02 MG tablet Take 1 tablet by mouth daily. Patient taking differently: Take 1 tablet by mouth at bedtime.  11/24/18   Allayne Stack, DO  fexofenadine (ALLEGRA) 180 MG tablet Take 1 tablet (180 mg total) by mouth daily. Patient not taking: Reported on 06/27/2019 02/12/19   Julieanne Manson, MD  Fluticasone-Salmeterol (ADVAIR DISKUS) 250-50 MCG/DOSE AEPB Inhale 1 puff into the lungs 2 (two) times daily. Patient not taking: Reported on 06/27/2019 02/12/19   Julieanne Manson, MD  ibuprofen (ADVIL) 200 MG tablet Take 1,000 mg by mouth daily as needed for moderate pain.    [provider]  meloxicam (MOBIC) 7.5 MG tablet Take 1 twice a day as needed for pain. Take with food. (Do not take with any other NSAID.) 07/25/19   Lajean Manes, MD  Multiple Vitamins-Minerals (HAIR  SKIN AND NAILS FORMULA) TABS Take 1 tablet by mouth daily.    [provider]  ondansetron (ZOFRAN) 4 MG tablet Take 1 tablet (4 mg total) by mouth every 8 (eight) hours as needed for nausea or vomiting. Patient not taking: Reported on 06/27/2019 03/29/19   Roxy Horseman, PA-C  predniSONE (STERAPRED UNI-PAK 21 TAB) 10 MG (21) TBPK tablet Take as directed for 6 days.--Take 6 on day 1, 5 on day 2, 4 on day 3, then 3 tablets on day 4, then 2 tablets on day 5, then 1 on day 6. 07/25/19   Lajean Manes, MD    sertraline (ZOLOFT) 50 MG tablet Take 1 tablet (50 mg total) by mouth daily. Patient taking differently: Take 50 mg by mouth at bedtime.  02/12/19   Julieanne Manson, MD  metoCLOPramide (REGLAN) 10 MG tablet Take 1 tablet (10 mg total) by mouth every 8 (eight) hours as needed for nausea. 06/27/19 07/25/19  Caccavale, Sophia, PA-C    Family History Family History  Problem Relation Age of Onset  . Healthy Mother   . Bipolar disorder Mother        ?Schizophrenia  . Healthy Father     Social History Social History   Tobacco Use  . Smoking status: Never Smoker  . Smokeless tobacco: Never Used  Vaping Use  . Vaping Use: Never used  Substance Use Topics  . Alcohol use: No  . Drug use: No     Allergies   Shellfish allergy, Bee venom, and Codeine   Review of Systems Review of Systems   Physical Exam Triage Vital Signs ED Triage Vitals  Enc Vitals Group     BP 07/25/19 1232 122/86     Pulse Rate 07/25/19 1232 90     Resp 07/25/19 1232 16     Temp 07/25/19 1232 98.3 F (36.8 C)     Temp Source 07/25/19 1232 Oral     SpO2 07/25/19 1232 98 %     Weight 07/25/19 1233 200 lb (90.7 kg)     Height 07/25/19 1233 5\' 9"  (1.753 m)     Head Circumference --      Peak Flow --      Pain Score 07/25/19 1232 7     Pain Loc --      Pain Edu? --      Excl. in GC? --    No data found.  Updated Vital Signs BP 122/86   Pulse 90   Temp 98.3 F (36.8 C) (Oral)   Resp 16   Ht 5\' 9"  (1.753 m)   Wt 90.7 kg   LMP 07/04/2019 (Exact Date)   SpO2 98%   BMI 29.53 kg/m    Physical Exam Vitals and nursing note reviewed.  Constitutional:  Pt was bent over walking to exam room.    General: She is in acute distress (Appears uncomfortable from low back pain.).     Appearance: She is well-developed. She is not toxic-appearing.  HENT:     Head: Normocephalic and atraumatic.  Eyes:     General: No scleral icterus.    Pupils: Pupils are equal, round, and reactive to light.   Cardiovascular:     Rate and Rhythm: Regular rhythm.     Heart sounds: Normal heart sounds.  Pulmonary:     Effort: Pulmonary effort is normal. No respiratory distress.     Breath sounds: Normal breath sounds. No wheezing or rales.  Chest:     Chest wall:  No tenderness.  Abdominal:     Palpations: Abdomen is soft.     Tenderness: There is no abdominal tenderness.  Musculoskeletal:     Cervical back: Neck supple. No tenderness.     Thoracic back: No tenderness.     Lumbar back: Spasm and tenderness right paralumbar muscles.  Tender midline lumbar spine without deformity.  No swelling, edema, deformity or lacerations.  There is decreased range of motion of the lumbar spine.    Right hip: Normal.  Nontender    Left hip: Normal.  Nontender.    Comments: + Tenderness over right sciatic notch  Positive R straight leg-raise test. Negative Left straight leg-raise test. Negative Right Luisa Hart test. Negative Left Luisa Hart test.  Skin:    General: Skin is warm and dry.     Findings: No lesion or rash.  Neurological:     Mental Status: She is alert and oriented to person, place, and time.     Cranial Nerves: No cranial nerve deficit.     Sensory: No sensory deficit.     Motor: No tremor, atrophy or abnormal muscle tone.     Gait: Gait normal.     Deep Tendon Reflexes: Reflexes normal.     Reflex Scores:      Patellar reflexes are 2+ on the right side and 2+ on the left side.      Achilles reflexes are 2+ on the right side and 2+ on the left side. Psychiatric:        Behavior: Behavior is cooperative.    UC Treatments / Results  Labs (all labs ordered are listed, but only abnormal results are displayed) Labs Reviewed  POC URINE PREG, ED  NEGATIVE    Radiology LUMBAR SPINE - COMPLETE 4+ VIEW  COMPARISON:  None.  FINDINGS: There is no evidence of lumbar spine fracture. Alignment is normal. Intervertebral disc spaces are  maintained.  IMPRESSION: Negative.  Electronically Signed   By: Lupita Raider M.D.   On: 07/25/2019 14:19  Procedures Procedures (including critical care time)  Medications Ordered in UC Medications - No data to display  Initial Impression / Assessment and Plan / UC Course  I have reviewed the triage vital signs and the nursing notes.  Pertinent labs & imaging results that were available during my care of the patient were reviewed by me and considered in my medical decision making (see chart for details).     Final Clinical Impressions(s) / UC Diagnoses   Final diagnoses:  Back pain of lumbar region with sciatica  Sciatica of right side  Strain of lumbar region, initial encounter     Discharge Instructions     Your x-ray of lumbar spine is normal.  Giving you a copy of the report. You likely have a either a severe musculoligamentous low back strain, or possibly sciatica with pinched nerve in the right low back.  Prescription sent to your pharmacy for meloxicam for pain, muscle relaxant, as well as prednisone Dosepak, which may very well help the pain if it is from a pinched nerve. Rest.  Heat.  You need to follow-up with neurosurgeon or orthopedist if pain persist for more than a week.  If any severe worsening symptoms, go to emergency room immediately. Please read attached instruction sheets on sciatica and lumbosacral strain. To help prevent GI side effects from meloxicam and prednisone, you may also take OTC Pepcid while you are taking both of them.    ED Prescriptions    Medication  Sig Dispense Auth. Provider   meloxicam (MOBIC) 7.5 MG tablet Take 1 twice a day as needed for pain. Take with food. (Do not take with any other NSAID.) 20 tablet Lajean ManesMassey, Chey Rachels, MD   cyclobenzaprine (FLEXERIL) 5 MG tablet Take 1 tablet up to 3 times a day as needed for muscle relaxant.-If needed, may take 2 tablets at night.-Caution, may cause drowsiness. 20 tablet Lajean ManesMassey, Rayma Hegg, MD    predniSONE (STERAPRED UNI-PAK 21 TAB) 10 MG (21) TBPK tablet Take as directed for 6 days.--Take 6 on day 1, 5 on day 2, 4 on day 3, then 3 tablets on day 4, then 2 tablets on day 5, then 1 on day 6. 21 tablet Lajean ManesMassey, Takeyah Wieman, MD        Lajean ManesMassey, Angelise Petrich, MD 07/27/19 1521

## 2019-08-03 ENCOUNTER — Ambulatory Visit: Payer: Self-pay | Admitting: Surgery

## 2019-08-03 NOTE — H&P (View-Only) (Signed)
Jeri Lager Appointment: 08/03/2019 10:10 AM Location: [facility] Patient #: [id number] DOB: May 03, 1990 Single / Language: [name] / Race: White Female  History of Present Illness Maisie Fus A. Micaella Gitto MD; 08/03/2019 12:38 PM) Patient words: Patient presents for evaluation of chronic right upper quadrant abdominal pain. She's had a history of gallstones for at least 4 years. Her symptoms consist of right upper quadrant pain after eating, nausea, vomiting, sometimes lose her breath and chest pain. These attacks last minutes to hours but are becoming more common. Ultrasound shows a large gallstone. Common bile duct is of normal caliber. No History of itching or yellowing of the skin or eyes      Right upper quadrant abdominal pain since 3 a.m. today. Morbid obesity.  EXAM: ULTRASOUND ABDOMEN LIMITED RIGHT UPPER QUADRANT  COMPARISON: 06/30/2017.  FINDINGS: Gallbladder:  The previously demonstrated 2 cm gallstone in the gallbladder is again demonstrated, currently measuring 2.5 cm in maximum diameter. Mild gallbladder wall thickening with a maximum thickness of 4.3 mm. No pericholecystic fluid and no sonographic Murphy sign.  Common bile duct:  Diameter: 2.4 mm.  Liver:  No focal lesion identified. Within normal limits in parenchymal echogenicity. Portal vein is patent on color Doppler imaging with normal direction of blood flow towards the liver.  Other: None.  IMPRESSION: 1. Cholelithiasis. 2. Interval mild diffuse gallbladder wall thickening without other secondary signs of acute cholecystitis. This is most likely due to chronic cholecystitis. Early acute cholecystitis is also possibility.   Electronically Signed By: Beckie Salts M.D. On: 08/22/2018 13:39.  The patient is a 29 year old female.   Diagnostic Studies History [name], [name]; 08/03/2019 10:27 AM) Mammogram never  Allergies [name], RMA; 08/03/2019 10:29 AM) Codeine  Sulfate *ANALGESICS - OPIOID* Nausea.  Medication History [name], [name]; 08/03/2019 10:31 AM) HYDROcodone-Acetaminophen (5-325MG  Tablet, Oral) Active. Albuterol Sulfate HFA (108 (90 Base)MCG/ACT Aerosol Soln, Inhalation) Active. Sertraline HCl (50MG  Tablet, Oral) Active. predniSONE (10MG  (21) Tab Ther Pack, Oral) Active. Ondansetron HCl (4MG  Tablet, Oral) Active. Metoclopramide HCl (10MG  Tablet, Oral) Active. Meloxicam (7.5MG  Tablet, Oral) Active. Drospirenone-Ethinyl Estradiol (3-0.02MG  Tablet, Oral) Active. Cyclobenzaprine HCl (5MG  Tablet, Oral) Active. Cranberry (400MG  Capsule, Oral) Active.  Pregnancy / Birth History , ; 08/03/2019 10:27 AM) Age at menarche 11 years. Contraceptive History Oral contraceptives.  Other Problems , ; 08/03/2019 10:27 AM) Anxiety Disorder Asthma Back Pain Cholelithiasis Migraine Headache Other disease, cancer, significant illness     Review of Systems [name] RMA; 08/03/2019 10:27 AM) General Present- Fatigue. Not Present- Appetite Loss, Chills, Fever, Night Sweats, Weight Gain and Weight Loss. HEENT Present- Wears glasses/contact lenses. Not Present- Earache, Hearing Loss, Hoarseness, Nose Bleed, Oral Ulcers, Ringing in the Ears, Seasonal Allergies, Sinus Pain, Sore Throat, Visual Disturbances and Yellow Eyes. Respiratory Not Present- Bloody sputum, Chronic Cough, Difficulty Breathing, Snoring and Wheezing. Breast Not Present- Breast Mass, Breast Pain, Nipple Discharge and Skin Changes. Gastrointestinal Present- Abdominal Pain and Bloating. Not Present- Bloody Stool, Change in Bowel Habits, Chronic diarrhea, Constipation, Difficulty Swallowing, Excessive gas, Gets full quickly at meals, Hemorrhoids, Indigestion, Nausea, Rectal Pain and Vomiting. Female Genitourinary Present- Pelvic Pain. Not Present- Frequency, Nocturia, Painful Urination and Urgency. Musculoskeletal Present- Back Pain  and Muscle Pain. Not Present- Joint Pain, Joint Stiffness, Muscle Weakness and Swelling of Extremities.  Vitals 08/05/2019 [name]; 08/03/2019 10:32 AM) 08/03/2019 10:31 AM Weight: 235.5 lb Height: 69in Body Surface Area: 2.21 m Body Mass Index: 34.78 kg/m  Temp.: 97.17F  Pulse: 78 (Regular)  P.OX:  95% (Room air) BP: 118/78(Sitting, Left Arm, Standard)        Physical Exam (Artemus Romanoff A. Miki Labuda MD; 08/03/2019 12:38 PM)  General Mental Status-Alert. General Appearance-Consistent with stated age. Hydration-Well hydrated. Voice-Normal.  Head and Neck Head-normocephalic, atraumatic with no lesions or palpable masses.  Eye Eyeball - Bilateral-Extraocular movements intact. Sclera/Conjunctiva - Bilateral-No scleral icterus.  Chest and Lung Exam Chest and lung exam reveals -quiet, even and easy respiratory effort with no use of accessory muscles and on auscultation, normal breath sounds, no adventitious sounds and normal vocal resonance. Inspection Chest Wall - Normal. Back - normal.  Cardiovascular Cardiovascular examination reveals -on palpation PMI is normal in location and amplitude, no palpable S3 or S4. Normal cardiac borders., normal heart sounds, regular rate and rhythm with no murmurs, carotid auscultation reveals no bruits and normal pedal pulses bilaterally.  Abdomen Inspection Inspection of the abdomen reveals - No Hernias. Skin - Scar - no surgical scars. Palpation/Percussion Palpation and Percussion of the abdomen reveal - Soft, Non Tender, No Rebound tenderness, No Rigidity (guarding) and No hepatosplenomegaly. Auscultation Auscultation of the abdomen reveals - Bowel sounds normal.  Neurologic Neurologic evaluation reveals -alert and oriented x 3 with no impairment of recent or remote memory. Mental Status-Normal.  Musculoskeletal Normal Exam - Left-Upper Extremity Strength Normal and Lower Extremity Strength Normal. Normal  Exam - Right-Upper Extremity Strength Normal, Lower Extremity Weakness.    Assessment & Plan (Otillia Cordone A. Bruce Mayers MD; 08/03/2019 12:39 PM)  CHRONIC CHOLECYSTITIS WITH CALCULUS (K80.10) Impression: Discuss treatment options for chronic cholecystitis and gallstones. Discussed surgical and medical options and pros and cons as well as complications and long-term expectations of each. She has opted for laparoscopic cholecystectomy with possible cholangiogram The procedure has been discussed with the patient. Risks of laparoscopic cholecystectomy include bleeding, infection, bile duct injury, leak, death, open surgery, diarrhea, other surgery, organ injury, blood vessel injury, DVT, and additional care.  Current Plans You are being scheduled for surgery- Our schedulers will call you.  You should hear from our office's scheduling department within 5 working days about the location, date, and time of surgery. We try to make accommodations for patient's preferences in scheduling surgery, but sometimes the OR schedule or the surgeon's schedule prevents us from making those accommodations.  If you have not heard from our office ([phone]) in 5 working days, call the office and ask for your surgeon's nurse.  If you have other questions about your diagnosis, plan, or surgery, call the office and ask for your surgeon's nurse.  Pt Education - Pamphlet Given - Laparoscopic Gallbladder Surgery: discussed with patient and provided information. Written instructions provided The anatomy & physiology of hepatobiliary & pancreatic function was discussed. The pathophysiology of gallbladder dysfunction was discussed. Natural history risks without surgery was discussed. I feel the risks of no intervention will lead to serious problems that outweigh the operative risks; therefore, I recommended cholecystectomy to remove the pathology. I explained laparoscopic techniques with possible need for an open  approach. Probable cholangiogram to evaluate the bilary tract was explained as well.  Risks such as bleeding, infection, abscess, leak, injury to other organs, need for further treatment, heart attack, death, and other risks were discussed. I noted a good likelihood this will help address the problem. Possibility that this will not correct all abdominal symptoms was explained. Goals of post-operative recovery were discussed as well. We will work to minimize complications. An educational handout further explaining the pathology and treatment options was given as well. Questions   were answered. The patient expresses understanding & wishes to proceed with surgery.  Pt Education - CCS Laparosopic Post Op HCI ([place]ross) Pt Education - Laparoscopic Cholecystectomy: gallbladder

## 2019-08-03 NOTE — H&P (Signed)
Carol Marquez Appointment: 08/03/2019 10:10 AM Location: [facility] Patient #: [id number] DOB: May 03, 1990 Single / Language: [name] / Race: White Female  History of Present Illness Carol Fus A. Montie Gelardi MD; 08/03/2019 12:38 PM) Patient words: Patient presents for evaluation of chronic right upper quadrant abdominal pain. She's had a history of gallstones for at least 4 years. Her symptoms consist of right upper quadrant pain after eating, nausea, vomiting, sometimes lose her breath and chest pain. These attacks last minutes to hours but are becoming more common. Ultrasound shows a large gallstone. Common bile duct is of normal caliber. No History of itching or yellowing of the skin or eyes      Right upper quadrant abdominal pain since 3 a.m. today. Morbid obesity.  EXAM: ULTRASOUND ABDOMEN LIMITED RIGHT UPPER QUADRANT  COMPARISON: 06/30/2017.  FINDINGS: Gallbladder:  The previously demonstrated 2 cm gallstone in the gallbladder is again demonstrated, currently measuring 2.5 cm in maximum diameter. Mild gallbladder wall thickening with a maximum thickness of 4.3 mm. No pericholecystic fluid and no sonographic Murphy sign.  Common bile duct:  Diameter: 2.4 mm.  Liver:  No focal lesion identified. Within normal limits in parenchymal echogenicity. Portal vein is patent on color Doppler imaging with normal direction of blood flow towards the liver.  Other: None.  IMPRESSION: 1. Cholelithiasis. 2. Interval mild diffuse gallbladder wall thickening without other secondary signs of acute cholecystitis. This is most likely due to chronic cholecystitis. Early acute cholecystitis is also possibility.   Electronically Signed By: Carol Marquez M.D. On: 08/22/2018 13:39.  The patient is a 29 year old female.   Diagnostic Studies History Carol Marquez, [name]; 08/03/2019 10:27 AM) Mammogram never  Allergies Carol Marquez, RMA; 08/03/2019 10:29 AM) Codeine  Sulfate *ANALGESICS - OPIOID* Nausea.  Medication History Carol Marquez, [name]; 08/03/2019 10:31 AM) HYDROcodone-Acetaminophen (5-325MG  Tablet, Oral) Active. Albuterol Sulfate HFA (108 (90 Base)MCG/ACT Aerosol Soln, Inhalation) Active. Sertraline HCl (50MG  Tablet, Oral) Active. predniSONE (10MG  (21) Tab Ther Pack, Oral) Active. Ondansetron HCl (4MG  Tablet, Oral) Active. Metoclopramide HCl (10MG  Tablet, Oral) Active. Meloxicam (7.5MG  Tablet, Oral) Active. Drospirenone-Ethinyl Estradiol (3-0.02MG  Tablet, Oral) Active. Cyclobenzaprine HCl (5MG  Tablet, Oral) Active. Cranberry (400MG  Capsule, Oral) Active.  Pregnancy / Birth History , ; 08/03/2019 10:27 AM) Age at menarche 11 years. Contraceptive History Oral contraceptives.  Other Problems , ; 08/03/2019 10:27 AM) Anxiety Disorder Asthma Back Pain Cholelithiasis Migraine Headache Other disease, cancer, significant illness     Review of Systems Carol Marquez RMA; 08/03/2019 10:27 AM) General Present- Fatigue. Not Present- Appetite Loss, Chills, Fever, Night Sweats, Weight Gain and Weight Loss. HEENT Present- Wears glasses/contact lenses. Not Present- Earache, Hearing Loss, Hoarseness, Nose Bleed, Oral Ulcers, Ringing in the Ears, Seasonal Allergies, Sinus Pain, Sore Throat, Visual Disturbances and Yellow Eyes. Respiratory Not Present- Bloody sputum, Chronic Cough, Difficulty Breathing, Snoring and Wheezing. Breast Not Present- Breast Mass, Breast Pain, Nipple Discharge and Skin Changes. Gastrointestinal Present- Abdominal Pain and Bloating. Not Present- Bloody Stool, Change in Bowel Habits, Chronic diarrhea, Constipation, Difficulty Swallowing, Excessive gas, Gets full quickly at meals, Hemorrhoids, Indigestion, Nausea, Rectal Pain and Vomiting. Female Genitourinary Present- Pelvic Pain. Not Present- Frequency, Nocturia, Painful Urination and Urgency. Musculoskeletal Present- Back Pain  and Muscle Pain. Not Present- Joint Pain, Joint Stiffness, Muscle Weakness and Swelling of Extremities.  Vitals 08/05/2019 Carol Marquez RMA; 08/03/2019 10:32 AM) 08/03/2019 10:31 AM Weight: 235.5 lb Height: 69in Body Surface Area: 2.21 m Body Mass Index: 34.78 kg/m  Temp.: 97.17F  Pulse: 78 (Regular)  P.OX:  95% (Room air) BP: 118/78(Sitting, Left Arm, Standard)        Physical Exam (Carol Wolters A. Aolani Piggott MD; 08/03/2019 12:38 PM)  General Mental Status-Alert. General Appearance-Consistent with stated age. Hydration-Well hydrated. Voice-Normal.  Head and Neck Head-normocephalic, atraumatic with no lesions or palpable masses.  Eye Eyeball - Bilateral-Extraocular movements intact. Sclera/Conjunctiva - Bilateral-No scleral icterus.  Chest and Lung Exam Chest and lung exam reveals -quiet, even and easy respiratory effort with no use of accessory muscles and on auscultation, normal breath sounds, no adventitious sounds and normal vocal resonance. Inspection Chest Wall - Normal. Back - normal.  Cardiovascular Cardiovascular examination reveals -on palpation PMI is normal in location and amplitude, no palpable S3 or S4. Normal cardiac borders., normal heart sounds, regular rate and rhythm with no murmurs, carotid auscultation reveals no bruits and normal pedal pulses bilaterally.  Abdomen Inspection Inspection of the abdomen reveals - No Hernias. Skin - Scar - no surgical scars. Palpation/Percussion Palpation and Percussion of the abdomen reveal - Soft, Non Tender, No Rebound tenderness, No Rigidity (guarding) and No hepatosplenomegaly. Auscultation Auscultation of the abdomen reveals - Bowel sounds normal.  Neurologic Neurologic evaluation reveals -alert and oriented x 3 with no impairment of recent or remote memory. Mental Status-Normal.  Musculoskeletal Normal Exam - Left-Upper Extremity Strength Normal and Lower Extremity Strength Normal. Normal  Exam - Right-Upper Extremity Strength Normal, Lower Extremity Weakness.    Assessment & Plan (Carol Much A. Keliyah Spillman MD; 08/03/2019 12:39 PM)  CHRONIC CHOLECYSTITIS WITH CALCULUS (K80.10) Impression: Discuss treatment options for chronic cholecystitis and gallstones. Discussed surgical and medical options and pros and cons as well as complications and long-term expectations of each. She has opted for laparoscopic cholecystectomy with possible cholangiogram The procedure has been discussed with the patient. Risks of laparoscopic cholecystectomy include bleeding, infection, bile duct injury, leak, death, open surgery, diarrhea, other surgery, organ injury, blood vessel injury, DVT, and additional care.  Current Plans You are being scheduled for surgery- Our schedulers will call you.  You should hear from our office's scheduling department within 5 working days about the location, date, and time of surgery. We try to make accommodations for patient's preferences in scheduling surgery, but sometimes the OR schedule or the surgeon's schedule prevents [place] from making those accommodations.  If you have not heard from our office [phone]) in 5 working days, call the office and ask for your surgeon's nurse.  If you have other questions about your diagnosis, plan, or surgery, call the office and ask for your surgeon's nurse.  Pt Education - Pamphlet Given - Laparoscopic Gallbladder Surgery: discussed with patient and provided information. Written instructions provided The anatomy & physiology of hepatobiliary & pancreatic function was discussed. The pathophysiology of gallbladder dysfunction was discussed. Natural history risks without surgery was discussed. I feel the risks of no intervention will lead to serious problems that outweigh the operative risks; therefore, I recommended cholecystectomy to remove the pathology. I explained laparoscopic techniques with possible need for an open  approach. Probable cholangiogram to evaluate the bilary tract was explained as well.  Risks such as bleeding, infection, abscess, leak, injury to other organs, need for further treatment, heart attack, death, and other risks were discussed. I noted a good likelihood this will help address the problem. Possibility that this will not correct all abdominal symptoms was explained. Goals of post-operative recovery were discussed as well. We will work to minimize complications. An educational handout further explaining the pathology and treatment options was given as well. Questions  were answered. The patient expresses understanding & wishes to proceed with surgery.  Pt Education - CCS Laparosopic Post Op HCI ([place]) Pt Education - Laparoscopic Cholecystectomy: gallbladder

## 2019-08-26 NOTE — Progress Notes (Signed)
[facility][id number] [place] Otto, [place] - [address] AT [facility] OF [address] [place] [address] Phone: [phone] Fax: [phone]  [facility] - [place], [place] - [address] [place] [address] Phone: [phone] Fax: [phone]      Your procedure is scheduled on August 24  Report to [facility] Main Entrance "A" at 0730 A.M., and check in at the Admitting office.  Call this number if you have problems the morning of surgery:  [phone]  Call [phone] if you have any questions prior to your surgery date Monday-Friday 8am-4pm    Remember:  Do not eat after midnight the night before your surgery  You may drink clear liquids until 0630 am the morning of your surgery.   Clear liquids allowed are: Water, Non-Citrus Juices (without pulp), Carbonated Beverages, Clear Tea, Black Coffee Only, and Gatorade    Take these medicines the morning of surgery with A SIP OF WATER  albuterol (VENTOLIN HFA) if needed, Please bring all inhalers with you the day of surgery.  fexofenadine (ALLEGRA)  As of today, STOP taking any Aspirin (unless otherwise instructed by your surgeon) Aleve, Naproxen, Ibuprofen, Motrin, Advil, Goody's, BC's, all herbal medications, fish oil, and all vitamins.                      Do not wear jewelry, make up, or nail polish            Do not wear lotions, powders, perfumes/colognes, or deodorant.            Do not shave 48 hours prior to surgery.  Men may shave face and neck.            Do not bring valuables to the hospital.            [facility] is not responsible for any belongings or valuables.  Do NOT Smoke (Tobacco/Vaping) or drink Alcohol 24 hours prior to your procedure If you use a CPAP at night, you may bring all equipment for your overnight stay.   Contacts, glasses, dentures or bridgework may not be worn into surgery.      For  patients admitted to the hospital, discharge time will be determined by your treatment team.   Patients discharged the day of surgery will not be allowed to drive home, and someone needs to stay with them for 24 hours.    Special instructions:   [facility]- Preparing For Surgery  Before surgery, you can play an important role. Because skin is not sterile, your skin needs to be as free of germs as possible. You can reduce the number of germs on your skin by washing with CHG (chlorahexidine gluconate) Soap before surgery.  CHG is an antiseptic cleaner which kills germs and bonds with the skin to continue killing germs even after washing.    Oral Hygiene is also important to reduce your risk of infection.  Remember - BRUSH YOUR TEETH THE MORNING OF SURGERY WITH YOUR REGULAR TOOTHPASTE  Please do not use if you have an allergy to CHG or antibacterial soaps. If your skin becomes reddened/irritated stop using the CHG.  Do not shave (including legs and underarms) for at least 48 hours prior to first CHG shower. It is OK to shave your face.  Please follow these instructions carefully.   1. Shower the NIGHT BEFORE SURGERY and the [facility]  OF SURGERY with CHG Soap.   2. If you chose to wash your hair, wash your hair first as usual with your normal shampoo.  3. After you shampoo, rinse your hair and body thoroughly to remove the shampoo.  4. Use CHG as you would any other liquid soap. You can apply CHG directly to the skin and wash gently with a scrungie or a clean washcloth.   5. Apply the CHG Soap to your body ONLY FROM THE NECK DOWN.  Do not use on open wounds or open sores. Avoid contact with your eyes, ears, mouth and genitals (private parts). Wash Face and genitals (private parts)  with your normal soap.   6. Wash thoroughly, paying special attention to the area where your surgery will be performed.  7. Thoroughly rinse your body with warm water from the neck down.  8. DO NOT shower/wash  with your normal soap after using and rinsing off the CHG Soap.  9. Pat yourself dry with a CLEAN TOWEL.  10. Wear CLEAN PAJAMAS to bed the night before surgery  11. Place CLEAN SHEETS on your bed the night of your first shower and DO NOT SLEEP WITH PETS.   Day of Surgery: Wear Clean/Comfortable clothing the morning of surgery Do not apply any deodorants/lotions.   Remember to brush your teeth WITH YOUR REGULAR TOOTHPASTE.   Please read over the following fact sheets that you were given.

## 2019-08-27 ENCOUNTER — Other Ambulatory Visit: Payer: Self-pay

## 2019-08-27 ENCOUNTER — Encounter (HOSPITAL_COMMUNITY)
Admission: RE | Admit: 2019-08-27 | Discharge: 2019-08-27 | Disposition: A | Discharge status: Home / Self Care | Payer: Self-pay | Source: Ambulatory Visit | Attending: Surgery | Admitting: Surgery

## 2019-08-27 ENCOUNTER — Encounter (HOSPITAL_COMMUNITY): Payer: Self-pay

## 2019-08-27 DIAGNOSIS — Z01812 Encounter for preprocedural laboratory examination: Secondary | ICD-10-CM | POA: Insufficient documentation

## 2019-08-27 LAB — COMPREHENSIVE METABOLIC PANEL
ALT: 17 U/L (ref 0–44)
AST: 18 U/L (ref 15–41)
Albumin: 3.7 g/dL (ref 3.5–5.0)
Alkaline Phosphatase: 40 U/L (ref 38–126)
Anion gap: 9 (ref 5–15)
BUN: 11 mg/dL (ref 6–20)
CO2: 23 mmol/L (ref 22–32)
Calcium: 9.1 mg/dL (ref 8.9–10.3)
Chloride: 103 mmol/L (ref 98–111)
Creatinine, Ser: 0.93 mg/dL (ref 0.44–1.00)
GFR calc Af Amer: >60 mL/min (ref >60)
GFR calc non Af Amer: >60 mL/min (ref >60)
Glucose, Bld: 101 mg/dL — ABNORMAL HIGH (ref 70–99)
Potassium: 3.8 mmol/L (ref 3.5–5.1)
Sodium: 135 mmol/L (ref 135–145)
Total Bilirubin: 0.8 mg/dL (ref 0.3–1.2)
Total Protein: 7.2 g/dL (ref 6.5–8.1)

## 2019-08-27 LAB — CBC WITH DIFFERENTIAL/PLATELET
Abs Immature Granulocytes: 0.02 10*3/uL (ref 0.00–0.07)
Basophils Absolute: 0.1 10*3/uL (ref 0.0–0.1)
Basophils Relative: 1 %
Eosinophils Absolute: 0.2 10*3/uL (ref 0.0–0.5)
Eosinophils Relative: 4 %
HCT: 41.2 % (ref 36.0–46.0)
Hemoglobin: 13.7 g/dL (ref 12.0–15.0)
Immature Granulocytes: 0 %
Lymphocytes Relative: 24 %
Lymphs Abs: 1.5 10*3/uL (ref 0.7–4.0)
MCH: 30.2 pg (ref 26.0–34.0)
MCHC: 33.3 g/dL (ref 30.0–36.0)
MCV: 90.9 fL (ref 80.0–100.0)
Monocytes Absolute: 0.5 10*3/uL (ref 0.1–1.0)
Monocytes Relative: 9 %
Neutro Abs: 3.9 10*3/uL (ref 1.7–7.7)
Neutrophils Relative %: 62 %
Platelets: 273 10*3/uL (ref 150–400)
RBC: 4.53 MIL/uL (ref 3.87–5.11)
RDW: 12 % (ref 11.5–15.5)
WBC: 6.3 10*3/uL (ref 4.0–10.5)
nRBC: 0 % (ref 0.0–0.2)

## 2019-08-27 NOTE — Progress Notes (Signed)
PCP - Julieanne Manson Cardiologist - n/a   Chest x-ray - not needed EKG - not needed Stress Test - denies ECHO - denies Cardiac Cath - denies   ERAS Protcol - yes  PRE-SURGERY Ensure or G2- not ordered  COVID TEST- 08/28/19   Anesthesia review: NO  Patient denies shortness of breath, fever, cough and chest pain at PAT appointment   All instructions explained to the patient, with a verbal understanding of the material. Patient agrees to go over the instructions while at home for a better understanding. Patient also instructed to self quarantine after being tested for COVID-19. The opportunity to ask questions was provided.

## 2019-08-28 ENCOUNTER — Other Ambulatory Visit (HOSPITAL_COMMUNITY)
Admission: RE | Admit: 2019-08-28 | Discharge: 2019-08-28 | Disposition: A | Discharge status: Home / Self Care | Payer: HRSA Program | Source: Ambulatory Visit | Attending: Surgery | Admitting: Surgery

## 2019-08-28 DIAGNOSIS — Z20822 Contact with and (suspected) exposure to covid-19: Secondary | ICD-10-CM | POA: Diagnosis not present

## 2019-08-28 DIAGNOSIS — Z01812 Encounter for preprocedural laboratory examination: Secondary | ICD-10-CM | POA: Insufficient documentation

## 2019-08-28 LAB — SARS CORONAVIRUS 2 (TAT 6-24 HRS): SARS Coronavirus 2: NEGATIVE

## 2019-09-01 ENCOUNTER — Ambulatory Visit (HOSPITAL_COMMUNITY): Payer: Self-pay

## 2019-09-01 ENCOUNTER — Other Ambulatory Visit: Payer: Self-pay

## 2019-09-01 ENCOUNTER — Encounter (HOSPITAL_COMMUNITY): Payer: Self-pay | Admitting: Surgery

## 2019-09-01 ENCOUNTER — Encounter (HOSPITAL_COMMUNITY)
Admission: RE | Disposition: A | Discharge status: Home / Self Care | Payer: Self-pay | Source: Home / Self Care | Attending: Surgery

## 2019-09-01 ENCOUNTER — Ambulatory Visit (HOSPITAL_COMMUNITY)
Admission: RE | Admit: 2019-09-01 | Discharge: 2019-09-01 | Disposition: A | Discharge status: Home / Self Care | Payer: Self-pay | Attending: Surgery | Admitting: Surgery

## 2019-09-01 DIAGNOSIS — Z793 Long term (current) use of hormonal contraceptives: Secondary | ICD-10-CM | POA: Insufficient documentation

## 2019-09-01 DIAGNOSIS — Z791 Long term (current) use of non-steroidal anti-inflammatories (NSAID): Secondary | ICD-10-CM | POA: Insufficient documentation

## 2019-09-01 DIAGNOSIS — Z6833 Body mass index (BMI) 33.0-33.9, adult: Secondary | ICD-10-CM | POA: Insufficient documentation

## 2019-09-01 DIAGNOSIS — Z79899 Other long term (current) drug therapy: Secondary | ICD-10-CM | POA: Insufficient documentation

## 2019-09-01 DIAGNOSIS — J45909 Unspecified asthma, uncomplicated: Secondary | ICD-10-CM | POA: Insufficient documentation

## 2019-09-01 DIAGNOSIS — Z419 Encounter for procedure for purposes other than remedying health state, unspecified: Secondary | ICD-10-CM

## 2019-09-01 DIAGNOSIS — M797 Fibromyalgia: Secondary | ICD-10-CM | POA: Insufficient documentation

## 2019-09-01 DIAGNOSIS — Z79890 Hormone replacement therapy (postmenopausal): Secondary | ICD-10-CM | POA: Insufficient documentation

## 2019-09-01 DIAGNOSIS — K8010 Calculus of gallbladder with chronic cholecystitis without obstruction: Secondary | ICD-10-CM | POA: Insufficient documentation

## 2019-09-01 DIAGNOSIS — F419 Anxiety disorder, unspecified: Secondary | ICD-10-CM | POA: Insufficient documentation

## 2019-09-01 DIAGNOSIS — F329 Major depressive disorder, single episode, unspecified: Secondary | ICD-10-CM | POA: Insufficient documentation

## 2019-09-01 HISTORY — PX: CHOLECYSTECTOMY: SHX55

## 2019-09-01 LAB — POCT PREGNANCY, URINE: Preg Test, Ur: NEGATIVE

## 2019-09-01 SURGERY — LAPAROSCOPIC CHOLECYSTECTOMY WITH INTRAOPERATIVE CHOLANGIOGRAM
Anesthesia: General | Site: Abdomen

## 2019-09-01 MED ORDER — HEMOSTATIC AGENTS (NO CHARGE) OPTIME
TOPICAL | Status: DC | PRN
  Administered 2019-09-01: 1 via TOPICAL

## 2019-09-01 MED ORDER — LACTATED RINGERS IV SOLN: INTRAVENOUS | Status: DC

## 2019-09-01 MED ORDER — PROPOFOL 10 MG/ML IV BOLUS
INTRAVENOUS | Status: DC | PRN
  Administered 2019-09-01: 200 mg via INTRAVENOUS

## 2019-09-01 MED ORDER — 0.9 % SODIUM CHLORIDE (POUR BTL) OPTIME
TOPICAL | Status: DC | PRN
  Administered 2019-09-01: 1000 mL

## 2019-09-01 MED ORDER — MIDAZOLAM HCL 2 MG/2ML IJ SOLN
INTRAMUSCULAR | Status: AC
  Filled 2019-09-01: qty 2

## 2019-09-01 MED ORDER — PROPOFOL 10 MG/ML IV BOLUS
INTRAVENOUS | Status: AC
  Filled 2019-09-01: qty 20

## 2019-09-01 MED ORDER — ROCURONIUM BROMIDE 10 MG/ML (PF) SYRINGE
PREFILLED_SYRINGE | INTRAVENOUS | Status: AC
  Filled 2019-09-01: qty 10

## 2019-09-01 MED ORDER — FENTANYL CITRATE (PF) 250 MCG/5ML IJ SOLN
INTRAMUSCULAR | Status: DC | PRN
  Administered 2019-09-01 (×2): 50 ug via INTRAVENOUS
  Administered 2019-09-01: 100 ug via INTRAVENOUS
  Administered 2019-09-01: 50 ug via INTRAVENOUS

## 2019-09-01 MED ORDER — LIDOCAINE 2% (20 MG/ML) 5 ML SYRINGE
INTRAMUSCULAR | Status: DC | PRN
  Administered 2019-09-01: 100 mg via INTRAVENOUS

## 2019-09-01 MED ORDER — ROCURONIUM BROMIDE 10 MG/ML (PF) SYRINGE
PREFILLED_SYRINGE | INTRAVENOUS | Status: DC | PRN
  Administered 2019-09-01: 90 mg via INTRAVENOUS

## 2019-09-01 MED ORDER — BUPIVACAINE HCL (PF) 0.25 % IJ SOLN
INTRAMUSCULAR | Status: DC | PRN
  Administered 2019-09-01: 10 mL

## 2019-09-01 MED ORDER — CHLORHEXIDINE GLUCONATE CLOTH 2 % EX PADS: 6.0000 | MEDICATED_PAD | Freq: Once | CUTANEOUS | Status: DC

## 2019-09-01 MED ORDER — ONDANSETRON HCL 4 MG/2ML IJ SOLN
INTRAMUSCULAR | Status: AC
  Filled 2019-09-01: qty 2

## 2019-09-01 MED ORDER — PROMETHAZINE HCL 25 MG/ML IJ SOLN: 6.2500 mg | INTRAMUSCULAR | Status: DC | PRN

## 2019-09-01 MED ORDER — FENTANYL CITRATE (PF) 100 MCG/2ML IJ SOLN
25.0000 ug | INTRAMUSCULAR | Status: DC | PRN
  Administered 2019-09-01 (×3): 50 ug via INTRAVENOUS

## 2019-09-01 MED ORDER — IBUPROFEN 800 MG PO TABS: 800.0000 mg | ORAL_TABLET | Freq: Three times a day (TID) | ORAL | 0 refills | Status: DC | PRN

## 2019-09-01 MED ORDER — SUGAMMADEX SODIUM 200 MG/2ML IV SOLN
INTRAVENOUS | Status: DC | PRN
  Administered 2019-09-01: 200 mg via INTRAVENOUS

## 2019-09-01 MED ORDER — DEXAMETHASONE SODIUM PHOSPHATE 10 MG/ML IJ SOLN
INTRAMUSCULAR | Status: DC | PRN
  Administered 2019-09-01: 10 mg via INTRAVENOUS

## 2019-09-01 MED ORDER — LIDOCAINE 2% (20 MG/ML) 5 ML SYRINGE
INTRAMUSCULAR | Status: AC
  Filled 2019-09-01: qty 5

## 2019-09-01 MED ORDER — HYDROMORPHONE HCL 1 MG/ML IJ SOLN
0.2500 mg | INTRAMUSCULAR | Status: DC | PRN
  Administered 2019-09-01 (×3): 0.5 mg via INTRAVENOUS

## 2019-09-01 MED ORDER — FENTANYL CITRATE (PF) 100 MCG/2ML IJ SOLN
INTRAMUSCULAR | Status: AC
  Filled 2019-09-01: qty 2

## 2019-09-01 MED ORDER — FENTANYL CITRATE (PF) 250 MCG/5ML IJ SOLN
INTRAMUSCULAR | Status: AC
  Filled 2019-09-01: qty 5

## 2019-09-01 MED ORDER — GABAPENTIN 300 MG PO CAPS
300.0000 mg | ORAL_CAPSULE | ORAL | Status: AC
  Administered 2019-09-01: 300 mg via ORAL
  Filled 2019-09-01: qty 1

## 2019-09-01 MED ORDER — HYDROCODONE-ACETAMINOPHEN 5-325 MG PO TABS
ORAL_TABLET | ORAL | Status: AC
  Filled 2019-09-01: qty 1

## 2019-09-01 MED ORDER — ONDANSETRON HCL 4 MG PO TABS: 4.0000 mg | ORAL_TABLET | Freq: Every day | ORAL | 1 refills | Status: DC | PRN

## 2019-09-01 MED ORDER — CHLORHEXIDINE GLUCONATE 0.12 % MT SOLN
15.0000 mL | Freq: Once | OROMUCOSAL | Status: AC
  Administered 2019-09-01: 15 mL via OROMUCOSAL
  Filled 2019-09-01: qty 15

## 2019-09-01 MED ORDER — HYDROMORPHONE HCL 1 MG/ML IJ SOLN
INTRAMUSCULAR | Status: AC
  Filled 2019-09-01: qty 1

## 2019-09-01 MED ORDER — CEFAZOLIN SODIUM-DEXTROSE 2-4 GM/100ML-% IV SOLN
2.0000 g | INTRAVENOUS | Status: AC
  Administered 2019-09-01: 2 g via INTRAVENOUS
  Filled 2019-09-01: qty 100

## 2019-09-01 MED ORDER — HYDROCODONE-ACETAMINOPHEN 5-325 MG PO TABS: 1.0000 | ORAL_TABLET | Freq: Four times a day (QID) | ORAL | 0 refills | Status: DC | PRN

## 2019-09-01 MED ORDER — HYDROMORPHONE HCL 1 MG/ML IJ SOLN
INTRAMUSCULAR | Status: DC
Start: 2019-09-01 — End: 2019-09-01
  Filled 2019-09-01: qty 1

## 2019-09-01 MED ORDER — DEXAMETHASONE SODIUM PHOSPHATE 10 MG/ML IJ SOLN
INTRAMUSCULAR | Status: AC
  Filled 2019-09-01: qty 1

## 2019-09-01 MED ORDER — ONDANSETRON HCL 4 MG/2ML IJ SOLN
INTRAMUSCULAR | Status: DC | PRN
  Administered 2019-09-01: 4 mg via INTRAVENOUS

## 2019-09-01 MED ORDER — SUCCINYLCHOLINE CHLORIDE 200 MG/10ML IV SOSY
PREFILLED_SYRINGE | INTRAVENOUS | Status: AC
  Filled 2019-09-01: qty 10

## 2019-09-01 MED ORDER — ORAL CARE MOUTH RINSE: 15.0000 mL | Freq: Once | OROMUCOSAL | Status: AC

## 2019-09-01 MED ORDER — ACETAMINOPHEN 500 MG PO TABS
1000.0000 mg | ORAL_TABLET | Freq: Once | ORAL | Status: AC
  Administered 2019-09-01: 1000 mg via ORAL
  Filled 2019-09-01: qty 2

## 2019-09-01 MED ORDER — HYDROCODONE-ACETAMINOPHEN 5-325 MG PO TABS
1.0000 | ORAL_TABLET | Freq: Once | ORAL | Status: AC
  Administered 2019-09-01: 1 via ORAL

## 2019-09-01 MED ORDER — BUPIVACAINE HCL (PF) 0.25 % IJ SOLN
INTRAMUSCULAR | Status: AC
  Filled 2019-09-01: qty 30

## 2019-09-01 MED ORDER — MIDAZOLAM HCL 2 MG/2ML IJ SOLN
INTRAMUSCULAR | Status: DC | PRN
  Administered 2019-09-01: 2 mg via INTRAVENOUS

## 2019-09-01 MED ORDER — SCOPOLAMINE 1 MG/3DAYS TD PT72
1.0000 | MEDICATED_PATCH | TRANSDERMAL | Status: DC
  Administered 2019-09-01: 1.5 mg via TRANSDERMAL
  Filled 2019-09-01: qty 1

## 2019-09-01 SURGICAL SUPPLY — 47 items
ADH SKN CLS APL DERMABOND .7 (GAUZE/BANDAGES/DRESSINGS) ×1
APL PRP STRL LF DISP 70% ISPRP (MISCELLANEOUS) ×1
APPLIER CLIP ROT 10 11.4 M/L (STAPLE) ×3
APR CLP MED LRG 11.4X10 (STAPLE) ×1
BAG SPEC RTRVL 10 TROC 200 (ENDOMECHANICALS) ×1
BLADE CLIPPER SURG (BLADE) IMPLANT
CANISTER SUCT 3000ML PPV (MISCELLANEOUS) ×3 IMPLANT
CHLORAPREP W/TINT 26 (MISCELLANEOUS) ×3 IMPLANT
CLIP APPLIE ROT 10 11.4 M/L (STAPLE) ×1 IMPLANT
COVER MAYO STAND STRL (DRAPES) ×1 IMPLANT
COVER SURGICAL LIGHT HANDLE (MISCELLANEOUS) ×3 IMPLANT
COVER WAND RF STERILE (DRAPES) ×1 IMPLANT
DERMABOND ADVANCED (GAUZE/BANDAGES/DRESSINGS) ×2
DERMABOND ADVANCED .7 DNX12 (GAUZE/BANDAGES/DRESSINGS) ×1 IMPLANT
DRAPE C-ARM 42X120 X-RAY (DRAPES) ×1 IMPLANT
ELECT REM PT RETURN 9FT ADLT (ELECTROSURGICAL) ×3
ELECTRODE REM PT RTRN 9FT ADLT (ELECTROSURGICAL) ×1 IMPLANT
GLOVE BIO SURGEON STRL SZ8 (GLOVE) ×3 IMPLANT
GLOVE BIOGEL PI IND STRL 8 (GLOVE) ×1 IMPLANT
GLOVE BIOGEL PI INDICATOR 8 (GLOVE) ×2
GOWN STRL REUS W/ TWL LRG LVL3 (GOWN DISPOSABLE) ×2 IMPLANT
GOWN STRL REUS W/ TWL XL LVL3 (GOWN DISPOSABLE) ×1 IMPLANT
GOWN STRL REUS W/TWL LRG LVL3 (GOWN DISPOSABLE) ×6
GOWN STRL REUS W/TWL XL LVL3 (GOWN DISPOSABLE) ×3
HEMOSTAT SNOW SURGICEL 2X4 (HEMOSTASIS) ×2 IMPLANT
KIT BASIN OR (CUSTOM PROCEDURE TRAY) ×3 IMPLANT
KIT TURNOVER KIT B (KITS) ×3 IMPLANT
NS IRRIG 1000ML POUR BTL (IV SOLUTION) ×3 IMPLANT
PAD ARMBOARD 7.5X6 YLW CONV (MISCELLANEOUS) ×3 IMPLANT
POUCH RETRIEVAL ECOSAC 10 (ENDOMECHANICALS) ×1 IMPLANT
POUCH RETRIEVAL ECOSAC 10MM (ENDOMECHANICALS) ×3
SCISSORS LAP 5X35 DISP (ENDOMECHANICALS) ×3 IMPLANT
SET CHOLANGIOGRAPH 5 50 .035 (SET/KITS/TRAYS/PACK) ×1 IMPLANT
SET IRRIG TUBING LAPAROSCOPIC (IRRIGATION / IRRIGATOR) ×3 IMPLANT
SET TUBE SMOKE EVAC HIGH FLOW (TUBING) ×3 IMPLANT
SLEEVE ENDOPATH XCEL 5M (ENDOMECHANICALS) ×3 IMPLANT
SPECIMEN JAR SMALL (MISCELLANEOUS) ×3 IMPLANT
SUT MNCRL AB 4-0 PS2 18 (SUTURE) ×3 IMPLANT
SUT VIC AB 2-0 UR6 27 (SUTURE) ×2 IMPLANT
TOWEL GREEN STERILE (TOWEL DISPOSABLE) ×3 IMPLANT
TOWEL GREEN STERILE FF (TOWEL DISPOSABLE) ×3 IMPLANT
TRAY LAPAROSCOPIC MC (CUSTOM PROCEDURE TRAY) ×3 IMPLANT
TROCAR XCEL BLUNT TIP 100MML (ENDOMECHANICALS) ×3 IMPLANT
TROCAR XCEL NON-BLD 11X100MML (ENDOMECHANICALS) ×3 IMPLANT
TROCAR XCEL NON-BLD 5MMX100MML (ENDOMECHANICALS) ×3 IMPLANT
WARMER LAPAROSCOPE (MISCELLANEOUS) ×3 IMPLANT
WATER STERILE IRR 1000ML POUR (IV SOLUTION) ×3 IMPLANT

## 2019-09-01 NOTE — Anesthesia Procedure Notes (Signed)
Procedure Name: Intubation Date/Time: 09/01/2019 9:47 AM Performed by: Shary Decamp, CRNA Pre-anesthesia Checklist: Patient identified, Emergency Drugs available, Suction available and Patient being monitored Patient Re-evaluated:Patient Re-evaluated prior to induction Oxygen Delivery Method: Circle system utilized Preoxygenation: Pre-oxygenation with 100% oxygen Induction Type: IV induction Ventilation: Mask ventilation without difficulty Laryngoscope Size: Miller and 2 Grade View: Grade I Tube type: Oral Tube size: 7.0 mm Number of attempts: 1 Airway Equipment and Method: Stylet and Oral airway Placement Confirmation: ETT inserted through vocal cords under direct vision,  positive ETCO2 and breath sounds checked- equal and bilateral Secured at: 22 cm Tube secured with: Tape Dental Injury: Teeth and Oropharynx as per pre-operative assessment

## 2019-09-01 NOTE — Op Note (Signed)
Laparoscopic Cholecystectomy  Procedure Note  Indications: This patient presents with symptomatic gallbladder disease and will undergo laparoscopic cholecystectomy.The procedure has been discussed with the patient. Operative and non operative treatments have been discussed. Risks of surgery include bleeding, infection,  Common bile duct injury,  Injury to the stomach,liver, colon,small intestine, abdominal wall,  Diaphragm,  Major blood vessels,  And the need for an open procedure.  Other risks include worsening of medical problems, death,  DVT and pulmonary embolism, and cardiovascular events.   Medical options have also been discussed. The patient has been informed of long term expectations of surgery and non surgical options,  The patient agrees to proceed.    Pre-operative Diagnosis: Calculus of gallbladder without mention of cholecystitis or obstruction  Post-operative Diagnosis: Same  Surgeon: Dortha Schwalbe  MD  Assistants: OR staff  Anesthesia: General endotracheal anesthesia and Local anesthesia 0.25.% bupivacaine  ASA Class: 2  Procedure Details  The patient was seen again in the Holding Room. The risks, benefits, complications, treatment options, and expected outcomes were discussed with the patient. The possibilities of reaction to medication, pulmonary aspiration, perforation of viscus, bleeding, recurrent infection, finding a normal gallbladder, the need for additional procedures, failure to diagnose a condition, the possible need to convert to an open procedure, and creating a complication requiring transfusion or operation were discussed with the patient. The patient and/or family concurred with the proposed plan, giving informed consent. The site of surgery properly noted/marked. The patient was taken to Operating Room, identified as Carol Marquez and the procedure verified as Laparoscopic Cholecystectomy with Intraoperative Cholangiograms. A Time Out was held and the above  information confirmed.  Prior to the induction of general anesthesia, antibiotic prophylaxis was administered. General endotracheal anesthesia was then administered and tolerated well. After the induction, the abdomen was prepped in the usual sterile fashion. The patient was positioned in the supine position with the left arm comfortably tucked, along with some reverse Trendelenburg.  Local anesthetic agent was injected into the skin near the umbilicus and an incision made. The midline fascia was incised and the Hasson technique was used to introduce a 12 mm port under direct vision. It was secured with a figure of eight Vicryl suture placed in the usual fashion. Pneumoperitoneum was then created with CO2 and tolerated well without any adverse changes in the patient's vital signs. Additional trocars were introduced under direct vision with an 11 mm trocar in the epigastrium and 2 5 mm trocars in the right upper quadrant. All skin incisions were infiltrated with a local anesthetic agent before making the incision and placing the trocars.   The gallbladder was identified, the fundus grasped and retracted cephalad. Adhesions were lysed bluntly and with the electrocautery where indicated, taking care not to injure any adjacent organs or viscus. The infundibulum was grasped and retracted laterally, exposing the peritoneum overlying the triangle of Calot. This was then divided and exposed in a blunt fashion. The cystic duct was clearly identified and bluntly dissected circumferentially. The junctions of the gallbladder, cystic duct and common bile duct were clearly identified prior to the division of any linear structure.   The critical view was obtained.  The cystic duct was quite small and cholangiogram not performed.  No evidence of elevated LFT's and no ductal dilation.   The cystic duct was then  ligated with surgical clips  on the patient side and  clipped on the gallbladder side and divided. The cystic  artery was identified, dissected free,  ligated with clips and divided as well. Posterior cystic artery clipped and divided.  The gallbladder was dissected from the liver bed in retrograde fashion with the electrocautery. The gallbladder was removed. The liver bed was irrigated and inspected. Hemostasis was achieved with the electrocautery. Copious irrigation was utilized and was repeatedly aspirated until clear all particulate matter. Hemostasis was achieved with no signs  Of bleeding or bile leakage.  Pneumoperitoneum was completely reduced after viewing removal of the trocars under direct vision. The wound was thoroughly irrigated and the fascia was then closed with a figure of eight suture; the skin was then closed with 4 O mopnocryl and a sterile dressing was applied.  Instrument, sponge, and needle counts were correct at closure and at the conclusion of the case.   Findings: Cholelithiasis  Estimated Blood Loss: Minimal         Drains: none          Total IV Fluids: per record          Specimens: Gallbladder           Complications: None; patient tolerated the procedure well.         Disposition: PACU - hemodynamically stable.         Condition: stable

## 2019-09-01 NOTE — Interval H&P Note (Signed)
History and Physical Interval Note:  09/01/2019 8:59 AM  Carol Marquez  has presented today for surgery, with the diagnosis of GALLSTONES.  The various methods of treatment have been discussed with the patient and family. After consideration of risks, benefits and other options for treatment, the patient has consented to  Procedure(s): LAPAROSCOPIC CHOLECYSTECTOMY WITH POSSIBLE INTRAOPERATIVE CHOLANGIOGRAM (N/A) as a surgical intervention.  The patient's history has been reviewed, patient examined, no change in status, stable for surgery.  I have reviewed the patient's chart and labs.  Questions were answered to the patient's satisfaction.   The procedure has been discussed with the patient. Operative and non operative treatments have been discussed. Risks of surgery include bleeding, infection,  Common bile duct injury,  Injury to the stomach,liver, colon,small intestine, abdominal wall,  Diaphragm,  Major blood vessels,  And the need for an open procedure.  Other risks include worsening of medical problems, death,  DVT and pulmonary embolism, and cardiovascular events.   Medical options have also been discussed. The patient has been informed of long term expectations of surgery and non surgical options,  The patient agrees to proceed.     Dortha Schwalbe MD

## 2019-09-01 NOTE — Anesthesia Preprocedure Evaluation (Addendum)
Anesthesia Evaluation  Patient identified by MRN, date of birth, ID band  Reviewed: Allergy & Precautions, NPO status , Patient's Chart, lab work & pertinent test results  Airway Mallampati: II  TM Distance: >3 FB Neck ROM: Full    Dental no notable dental hx.    Pulmonary asthma ,    Pulmonary exam normal breath sounds clear to auscultation       Cardiovascular Exercise Tolerance: Good negative cardio ROS Normal cardiovascular exam Rhythm:Regular Rate:Normal     Neuro/Psych PSYCHIATRIC DISORDERS Depression  Neuromuscular disease (chronic motor tic disorder)    GI/Hepatic negative GI ROS, Neg liver ROS,   Endo/Other  obesity  Renal/GU negative Renal ROS     Musculoskeletal  (+) Fibromyalgia -  Abdominal Normal abdominal exam  (+)   Peds negative pediatric ROS (+)  Hematology negative hematology ROS (+)   Anesthesia Other Findings   Reproductive/Obstetrics                            Anesthesia Physical Anesthesia Plan  ASA: II  Anesthesia Plan: General   Post-op Pain Management:    Induction: Intravenous  PONV Risk Score and Plan: 3 and Dexamethasone, Ondansetron, Midazolam and Scopolamine patch - Pre-op  Airway Management Planned: Oral ETT  Additional Equipment:   Intra-op Plan:   Post-operative Plan: Extubation in OR  Informed Consent: I have reviewed the patients History and Physical, chart, labs and discussed the procedure including the risks, benefits and alternatives for the proposed anesthesia with the patient or authorized representative who has indicated his/her understanding and acceptance.       Plan Discussed with:   Anesthesia Plan Comments:        Anesthesia Quick Evaluation

## 2019-09-01 NOTE — Discharge Instructions (Signed)
[facility] ______[facility] °LAPAROSCOPIC SURGERY: POST OP INSTRUCTIONS °Always review your discharge instruction sheet given to you by the facility where your surgery was performed. °IF YOU HAVE DISABILITY OR FAMILY LEAVE FORMS, YOU MUST BRING THEM TO THE OFFICE FOR PROCESSING.   °DO NOT GIVE THEM TO YOUR DOCTOR. ° °1. A prescription for pain medication may be given to you upon discharge.  Take your pain medication as prescribed, if needed.  If narcotic pain medicine is not needed, then you may take acetaminophen (Tylenol) or ibuprofen (Advil) as needed. °2. Take your usually prescribed medications unless otherwise directed. °3. If you need a refill on your pain medication, please contact your pharmacy.  They will contact our office to request authorization. Prescriptions will not be filled after 5pm or on week-ends. °4. You should follow a light diet the first few days after arrival home, such as soup and crackers, etc.  Be sure to include lots of fluids daily. °5. Most patients will experience some swelling and bruising in the area of the incisions.  Ice packs will help.  Swelling and bruising can take several days to resolve.  °6. It is common to experience some constipation if taking pain medication after surgery.  Increasing fluid intake and taking a stool softener (such as Colace) will usually help or prevent this problem from occurring.  A mild laxative (Milk of Magnesia or Miralax) should be taken according to package instructions if there are no bowel movements after 48 hours. °7. Unless discharge instructions indicate otherwise, you may remove your bandages 24-48 hours after surgery, and you may shower at that time.  You may have steri-strips (small skin tapes) in place directly over the incision.  These strips should be left on the skin for 7-10 days.  If your surgeon used skin glue on the incision, you may shower in 24 hours.  The glue will flake off over the next 2-3 weeks.  Any sutures or  staples will be removed at the office during your follow-up visit. °8. ACTIVITIES:  You may resume regular (light) daily activities beginning the next day--such as daily self-care, walking, climbing stairs--gradually increasing activities as tolerated.  You may have sexual intercourse when it is comfortable.  Refrain from any heavy lifting or straining until approved by your doctor. °a. You may drive when you are no longer taking prescription pain medication, you can comfortably wear a seatbelt, and you can safely maneuver your car and apply brakes. °b. RETURN TO WORK:  __________________________________________________________ °9. You should see your doctor in the office for a follow-up appointment approximately 2-3 weeks after your surgery.  Make sure that you call for this appointment within a day or two after you arrive home to insure a convenient appointment time. °10. OTHER INSTRUCTIONS: __________________________________________________________________________________________________________________________ __________________________________________________________________________________________________________________________ °WHEN TO CALL YOUR DOCTOR: °1. Fever over 101.0 °2. Inability to urinate °3. Continued bleeding from incision. °4. Increased pain, redness, or drainage from the incision. °5. Increasing abdominal pain ° °The clinic staff is available to answer your questions during regular business hours.  Please don’t hesitate to call and ask to speak to one of the nurses for clinical concerns.  If you have a medical emergency, go to the nearest emergency room or call 911.  A surgeon from [facility] is always on call at the hospital. °[address], [address], [place], [place]  [address] ? [address] [address], [place],    [address] °[phone] ? [phone] ? FAX [phone] °Web site:   [email or web address] °

## 2019-09-01 NOTE — Anesthesia Postprocedure Evaluation (Signed)
Anesthesia Post Note  Patient: Carol Marquez  Procedure(s) Performed: LAPAROSCOPIC CHOLECYSTECTOMY (N/A Abdomen)     Patient location during evaluation: PACU Anesthesia Type: General Level of consciousness: awake and alert Pain management: pain level controlled Vital Signs Assessment: post-procedure vital signs reviewed and stable Respiratory status: spontaneous breathing, nonlabored ventilation, respiratory function stable and patient connected to nasal cannula oxygen Cardiovascular status: blood pressure returned to baseline and stable Postop Assessment: no apparent nausea or vomiting Anesthetic complications: no   No complications documented.  Last Vitals:  Vitals:   09/01/19 1155 09/01/19 1210  BP: (!) 138/93 (!) 144/80  Pulse: 81 72  Resp: 18 12  Temp:  36.6 C  SpO2: 95%     Last Pain:  Vitals:   09/01/19 1215  TempSrc:   PainSc: 4                  Candra R Tempestt Silba

## 2019-09-01 NOTE — Transfer of Care (Signed)
Immediate Anesthesia Transfer of Care Note  Patient: Carol Marquez  Procedure(s) Performed: LAPAROSCOPIC CHOLECYSTECTOMY (N/A Abdomen)  Patient Location: PACU  Anesthesia Type:General  Level of Consciousness: awake, alert  and oriented  Airway & Oxygen Therapy: Patient Spontanous Breathing and Patient connected to face mask oxygen  Post-op Assessment: Report given to RN, Post -op Vital signs reviewed and stable and Patient moving all extremities  Post vital signs: Reviewed and stable  Last Vitals:  Vitals Value Taken Time  BP 151/73 09/01/19 1111  Temp 36.9 C 09/01/19 1110  Pulse 83 09/01/19 1117  Resp 16 09/01/19 1117  SpO2 97 % 09/01/19 1117  Vitals shown include unvalidated device data.  Last Pain:  Vitals:   09/01/19 1110  TempSrc:   PainSc: 10-Worst pain ever         Complications: No complications documented.

## 2019-09-02 ENCOUNTER — Encounter (HOSPITAL_COMMUNITY): Payer: Self-pay | Admitting: Surgery

## 2019-09-02 LAB — SURGICAL PATHOLOGY

## 2019-10-04 ENCOUNTER — Other Ambulatory Visit: Payer: Self-pay | Admitting: Family Medicine

## 2019-10-07 NOTE — Telephone Encounter (Signed)
Please notify patient that it appears she has not taken Sertraline

## 2019-10-10 ENCOUNTER — Other Ambulatory Visit: Payer: Self-pay | Admitting: Family Medicine

## 2019-10-16 NOTE — Telephone Encounter (Signed)
Left message on machine to return call about medication

## 2019-10-16 NOTE — Telephone Encounter (Signed)
See phone note--needs a follow up    Britt Boozer spoke with patient; information was given. Appointment scheduled for 12/15/2019 @ 3:00 pm

## 2019-10-16 NOTE — Telephone Encounter (Signed)
Please call patient.  It appears she has not filled her Sertraline since March by pharmacy records.  Needs to clarifiy.  Do not want her starting and stopping the medication.  She likely needs an appt to discuss what's going on.

## 2019-10-23 NOTE — Telephone Encounter (Signed)
Spoke with patient she is taking a 1/2 tablet daily since March due to medication being so expensive. She has been out for 2 weeks now. Please advise if appointment needed or can refill be sent in. Message sent to Dr. Delrae Alfred to review.

## 2019-10-29 NOTE — Telephone Encounter (Signed)
Pt. Schedule for 12/15/2019 @ 3:00 pm

## 2019-10-29 NOTE — Telephone Encounter (Signed)
Needs an appt

## 2019-12-15 ENCOUNTER — Ambulatory Visit: Payer: Self-pay | Admitting: Internal Medicine

## 2019-12-24 ENCOUNTER — Ambulatory Visit: Payer: Self-pay | Admitting: Internal Medicine

## 2019-12-30 ENCOUNTER — Encounter (HOSPITAL_COMMUNITY): Payer: Self-pay

## 2019-12-30 ENCOUNTER — Emergency Department (HOSPITAL_COMMUNITY)
Admission: EM | Admit: 2019-12-30 | Discharge: 2019-12-30 | Disposition: A | Discharge status: Home / Self Care | Payer: Self-pay | Attending: Emergency Medicine | Admitting: Emergency Medicine

## 2019-12-30 ENCOUNTER — Other Ambulatory Visit: Payer: Self-pay

## 2019-12-30 DIAGNOSIS — T6291XA Toxic effect of unspecified noxious substance eaten as food, accidental (unintentional), initial encounter: Secondary | ICD-10-CM | POA: Insufficient documentation

## 2019-12-30 DIAGNOSIS — R112 Nausea with vomiting, unspecified: Secondary | ICD-10-CM

## 2019-12-30 DIAGNOSIS — Z79899 Other long term (current) drug therapy: Secondary | ICD-10-CM | POA: Insufficient documentation

## 2019-12-30 DIAGNOSIS — J45909 Unspecified asthma, uncomplicated: Secondary | ICD-10-CM | POA: Insufficient documentation

## 2019-12-30 DIAGNOSIS — Z7951 Long term (current) use of inhaled steroids: Secondary | ICD-10-CM | POA: Insufficient documentation

## 2019-12-30 LAB — I-STAT BETA HCG BLOOD, ED (MC, WL, AP ONLY): I-stat hCG, quantitative: <5 m[IU]/mL (ref <5)

## 2019-12-30 LAB — URINALYSIS, ROUTINE W REFLEX MICROSCOPIC
Bilirubin Urine: NEGATIVE
Glucose, UA: NEGATIVE mg/dL
Hgb urine dipstick: NEGATIVE
Ketones, ur: NEGATIVE mg/dL
Nitrite: NEGATIVE
Protein, ur: 30 mg/dL — AB
Specific Gravity, Urine: 1.027 (ref 1.005–1.030)
pH: 5 (ref 5.0–8.0)

## 2019-12-30 LAB — COMPREHENSIVE METABOLIC PANEL
ALT: 24 U/L (ref 0–44)
AST: 20 U/L (ref 15–41)
Albumin: 4 g/dL (ref 3.5–5.0)
Alkaline Phosphatase: 46 U/L (ref 38–126)
Anion gap: 12 (ref 5–15)
BUN: 12 mg/dL (ref 6–20)
CO2: 22 mmol/L (ref 22–32)
Calcium: 9.5 mg/dL (ref 8.9–10.3)
Chloride: 104 mmol/L (ref 98–111)
Creatinine, Ser: 0.87 mg/dL (ref 0.44–1.00)
GFR, Estimated: >60 mL/min (ref >60)
Glucose, Bld: 123 mg/dL — ABNORMAL HIGH (ref 70–99)
Potassium: 4.1 mmol/L (ref 3.5–5.1)
Sodium: 138 mmol/L (ref 135–145)
Total Bilirubin: 1.3 mg/dL — ABNORMAL HIGH (ref 0.3–1.2)
Total Protein: 7.4 g/dL (ref 6.5–8.1)

## 2019-12-30 LAB — CBC
HCT: 44.2 % (ref 36.0–46.0)
Hemoglobin: 14.5 g/dL (ref 12.0–15.0)
MCH: 28.7 pg (ref 26.0–34.0)
MCHC: 32.8 g/dL (ref 30.0–36.0)
MCV: 87.5 fL (ref 80.0–100.0)
Platelets: 286 10*3/uL (ref 150–400)
RBC: 5.05 MIL/uL (ref 3.87–5.11)
RDW: 12 % (ref 11.5–15.5)
WBC: 13.1 10*3/uL — ABNORMAL HIGH (ref 4.0–10.5)
nRBC: 0 % (ref 0.0–0.2)

## 2019-12-30 LAB — LIPASE, BLOOD: Lipase: 24 U/L (ref 11–51)

## 2019-12-30 MED ORDER — ONDANSETRON 4 MG PO TBDP
ORAL_TABLET | ORAL | Status: AC
  Filled 2019-12-30: qty 1

## 2019-12-30 MED ORDER — ONDANSETRON 4 MG PO TBDP
4.0000 mg | ORAL_TABLET | Freq: Once | ORAL | Status: AC | PRN
  Administered 2019-12-30: 01:00:00 4 mg via ORAL

## 2019-12-30 MED ORDER — PROMETHAZINE HCL 12.5 MG PO TABS: 12.5000 mg | ORAL_TABLET | Freq: Three times a day (TID) | ORAL | 0 refills | Status: DC | PRN

## 2019-12-30 MED ORDER — PROMETHAZINE HCL 25 MG PO TABS
25.0000 mg | ORAL_TABLET | Freq: Once | ORAL | Status: AC
  Administered 2019-12-30: 14:00:00 25 mg via ORAL
  Filled 2019-12-30: qty 1

## 2019-12-30 MED ORDER — OMEPRAZOLE 20 MG PO CPDR: 20.0000 mg | DELAYED_RELEASE_CAPSULE | Freq: Every day | ORAL | 0 refills | Status: DC

## 2019-12-30 NOTE — Discharge Instructions (Addendum)
Seen here for nausea, vomiting.  Lab work and exam all look reassuring.  I have given you a prescription for Phenergan which will help you with your nausea please take as needed.  Also given you an acid pill please take as prescribed.  I recommend a bland diet for the next couple days and slowly reintroduce foods.  Please follow-up with your PCP for further evaluation management.  Come back to the emergency department if you develop chest pain, shortness of breath, severe abdominal pain, uncontrolled nausea, vomiting, diarrhea.

## 2019-12-30 NOTE — ED Triage Notes (Signed)
Pt here complaining of food poisoning after having a crab boil tonight for dinner at approx 5:30-6, states she feels the mussels were "off," began throwing up approx 7pm. Pt states emesis has turned yellow to reflect bile or simply dry heaving. Pt also endorses one episode of diarrhea.

## 2019-12-30 NOTE — ED Provider Notes (Signed)
[facility] EMERGENCY DEPARTMENT Provider Note   CSN: [id number] Arrival date & time: 12/30/19  0032     History Chief Complaint  Patient presents with  . Food poisoning    Carol Marquez is a 29 y.o. female.  HPI   Patient with significant medical history of asthma, endometriosis, fibromyalgia presents to the emergency department with chief complaint of food poisoning.  Patient states last night she was out and ate some muscles which she states seemed to be strange.  She only ate a few but immediately after she started become nauseous and started to vomit profusely.  She states she was unable to hold  food or liquid down last night and has some epigastric pain.  She states this morning she is able to tolerate some ginger ale but has no appetite and has waves of nausea.  She endorses that she had slight subjective fevers and chills but denies nasal congestion, sore throat, chest pain, shortness of breath, diarrhea, constipation, difficulty urination, pedal edema.  She denies any alleviating factors.  Past Medical History:  Diagnosis Date  . Asthma    Diagnosed age 40 or 29 yo.  . Calculus of gallbladder   . Endometriosis   . Fibromyalgia     Patient Active Problem List   Diagnosis Date Noted  . Calculus of gallbladder without cholecystitis without obstruction 04/08/2019  . Depression 04/08/2019  . Seasonal allergies 04/08/2019  . Class 2 obesity with body mass index (BMI) of 35.0 to 35.9 in adult 04/08/2019  . Health care maintenance 09/25/2018  . Chronic motor tic disorder 09/25/2018  . 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 Fibromyalgia 09/11/2018  . Establishing care with new doctor, encounter for 09/11/2018  . Asthma 09/11/2018  . Chronic cholecystitis 09/11/2018  . Endometriosis 09/11/2018  . BMI 33.0-33.9,adult 09/11/2018    Past Surgical History:  Procedure Laterality Date  . CHOLECYSTECTOMY N/A 09/01/2019   Procedure: LAPAROSCOPIC  CHOLECYSTECTOMY;  Surgeon: Harriette Bouillon, MD;  Location: [facility] OR;  Service: General;  Laterality: N/A;  . WISDOM TOOTH EXTRACTION  2011     OB History   No obstetric history on file.     Family History  Problem Relation Age of Onset  . Healthy Mother   . Bipolar disorder Mother        ?Schizophrenia  . Healthy Father     Social History   Tobacco Use  . Smoking status: Never Smoker  . Smokeless tobacco: Never Used  Vaping Use  . Vaping Use: Never used  Substance Use Topics  . Alcohol use: No  . Drug use: No    Home Medications Prior to Admission medications   Medication Sig Start Date End Date Taking? Authorizing Provider  albuterol (VENTOLIN HFA) 108 (90 Base) MCG/ACT inhaler Inhale 2 puffs into the lungs every 4 (four) hours as needed for wheezing or shortness of breath. 02/12/19   Julieanne Manson, MD  Cranberry-Vitamin C [facility] CONCENTRATE/VITAMINC PO) Take 2 capsules by mouth daily.    [provider]  cyclobenzaprine (FLEXERIL) 5 MG tablet Take 1 tablet up to 3 times a day as needed for muscle relaxant.-If needed, may take 2 tablets at night.-Caution, may cause drowsiness. Patient not taking: Reported on 08/20/2019 07/25/19   Lajean Manes, MD  diphenhydrAMINE (BENADRYL) 25 MG tablet Take 50 mg by mouth daily as needed for itching or allergies.    [provider]  drospirenone-ethinyl estradiol (YAZ) 3-0.02 MG tablet Take 1 tablet by mouth daily.  Patient taking differently: Take 1 tablet by mouth at bedtime.  11/24/18   Allayne StackBeard, Samantha N, DO  fexofenadine (ALLEGRA) 180 MG tablet Take 1 tablet (180 mg total) by mouth daily. 02/12/19   Julieanne MansonMulberry, Elizabeth, MD  Fluticasone-Salmeterol (ADVAIR DISKUS) 250-50 MCG/DOSE AEPB Inhale 1 puff into the lungs 2 (two) times daily. Patient not taking: Reported on 06/27/2019 02/12/19   Julieanne MansonMulberry, Elizabeth, MD  HYDROcodone-acetaminophen (NORCO/VICODIN) 5-325 MG tablet Take 1 tablet by mouth every 6 (six) hours as needed  for moderate pain. 09/01/19   Cornett, Maisie Fushomas, MD  ibuprofen (ADVIL) 200 MG tablet Take 400-800 mg by mouth every 8 (eight) hours as needed for moderate pain.     [provider]  ibuprofen (ADVIL) 800 MG tablet Take 1 tablet (800 mg total) by mouth every 8 (eight) hours as needed. 09/01/19   Cornett, Maisie Fushomas, MD  meloxicam (MOBIC) 7.5 MG tablet Take 1 twice a day as needed for pain. Take with food. (Do not take with any other NSAID.) Patient not taking: Reported on 08/20/2019 07/25/19   Lajean ManesMassey, David, MD  Multiple Vitamins-Minerals (HAIR SKIN AND NAILS FORMULA) TABS Take 1 tablet by mouth daily.    [provider]  omeprazole (PRILOSEC) 20 MG capsule Take 1 capsule (20 mg total) by mouth daily. 12/30/19 01/29/20  Carroll SageFaulkner, Dekendrick Uzelac J, PA-C  ondansetron (ZOFRAN) 4 MG tablet Take 1 tablet (4 mg total) by mouth every 8 (eight) hours as needed for nausea or vomiting. 03/29/19   Roxy HorsemanBrowning, Robert, PA-C  ondansetron (ZOFRAN) 4 MG tablet Take 1 tablet (4 mg total) by mouth daily as needed for nausea or vomiting. 09/01/19 08/31/20  Cornett, Maisie Fushomas, MD  predniSONE (STERAPRED UNI-PAK 21 TAB) 10 MG (21) TBPK tablet Take as directed for 6 days.--Take 6 on day 1, 5 on day 2, 4 on day 3, then 3 tablets on day 4, then 2 tablets on day 5, then 1 on day 6. Patient not taking: Reported on 08/20/2019 07/25/19   Lajean ManesMassey, David, MD  promethazine (PHENERGAN) 12.5 MG tablet Take 1 tablet (12.5 mg total) by mouth every 8 (eight) hours as needed for up to 7 days for nausea or vomiting. 12/30/19 01/06/20  Carroll SageFaulkner, Rohit Deloria J, PA-C  sertraline (ZOLOFT) 50 MG tablet Take 1 tablet (50 mg total) by mouth daily. 10/29/19   Julieanne MansonMulberry, Elizabeth, MD  metoCLOPramide (REGLAN) 10 MG tablet Take 1 tablet (10 mg total) by mouth every 8 (eight) hours as needed for nausea. 06/27/19 07/25/19  Caccavale, Sophia, PA-C    Allergies    Shellfish allergy, Bee venom, and Codeine  Review of Systems   Review of Systems  Constitutional:  Positive for chills and fever.  HENT: Negative for congestion and sore throat.   Respiratory: Negative for cough and shortness of breath.   Cardiovascular: Negative for chest pain.  Gastrointestinal: Positive for abdominal pain and vomiting. Negative for diarrhea.  Genitourinary: Negative for enuresis and flank pain.  Musculoskeletal: Negative for back pain.  Skin: Negative for rash.  Neurological: Negative for headaches.  Hematological: Does not bruise/bleed easily.    Physical Exam Updated Vital Signs BP 134/73 (BP Location: Left Arm)   Pulse 65   Temp 98.2 F (36.8 C) (Oral)   Resp 20   Ht 5\' 10"  (1.778 m)   Wt 102.1 kg   SpO2 98%   BMI 32.28 kg/m   Physical Exam Vitals and nursing note reviewed.  Constitutional:      General: She is not in acute distress.  Appearance: She is not ill-appearing.  HENT:     Head: Normocephalic and atraumatic.     Nose: No congestion.     Mouth/Throat:     Mouth: Mucous membranes are moist.     Pharynx: Oropharynx is clear. No oropharyngeal exudate or posterior oropharyngeal erythema.     Comments: Oropharynx is visualized, and tongue uvula both midline, no erythema or exudates noted. Eyes:     Conjunctiva/sclera: Conjunctivae normal.  Cardiovascular:     Rate and Rhythm: Normal rate and regular rhythm.     Pulses: Normal pulses.     Heart sounds: No murmur heard. No friction rub. No gallop.   Pulmonary:     Effort: No respiratory distress.     Breath sounds: No wheezing, rhonchi or rales.  Abdominal:     Palpations: Abdomen is soft.     Tenderness: There is abdominal tenderness. There is no right CVA tenderness or left CVA tenderness.     Comments: Patient's abdomen was palpated she had slight epigastric pain, there is no rebound tenderness, negative Murphy sign, negative peritoneal sign.  She had no CVA tenderness.  Musculoskeletal:     Comments: Patient is moving all 4 extremities at difficulty.  Skin:    General: Skin is  warm and dry.  Neurological:     Mental Status: She is alert.  Psychiatric:        Mood and Affect: Mood normal.     ED Results / Procedures / Treatments   Labs (all labs ordered are listed, but only abnormal results are displayed) Labs Reviewed  COMPREHENSIVE METABOLIC PANEL - Abnormal; Notable for the following components:      Result Value   Glucose, Bld 123 (*)    Total Bilirubin 1.3 (*)    All other components within normal limits  CBC - Abnormal; Notable for the following components:   WBC 13.1 (*)    All other components within normal limits  URINALYSIS, ROUTINE W REFLEX MICROSCOPIC - Abnormal; Notable for the following components:   APPearance HAZY (*)    Protein, ur 30 (*)    Leukocytes,Ua MODERATE (*)    Bacteria, UA FEW (*)    All other components within normal limits  LIPASE, BLOOD  I-STAT BETA HCG BLOOD, ED ([facility], [facility], [facility] ONLY)    EKG None  Radiology No results found.  Procedures Procedures (including critical care time)  Medications Ordered in ED Medications  ondansetron (ZOFRAN-ODT) 4 MG disintegrating tablet (has no administration in time range)  ondansetron (ZOFRAN-ODT) disintegrating tablet 4 mg (4 mg Oral Given 12/30/19 0100)  promethazine (PHENERGAN) tablet 25 mg (25 mg Oral Given 12/30/19 1359)    ED Course  I have reviewed the triage vital signs and the nursing notes.  Pertinent labs & imaging results that were available during my care of the patient were reviewed by me and considered in my medical decision making (see chart for details).    MDM Rules/Calculators/A&P                         Patient presents with chief complaint of food poisoning.  She is alert, does not appear in acute distress, vital signs reassuring.  Will obtain basic lab work-up, provide her with antiemetics and reevaluate.  Patient is reevaluated after prior with Phenergan, she states she is feeling much better, states the nausea has completely gone away.  She is  tolerating p.o. without difficulty.  Vital signs have remained  stable.  CBC shows slight leukocytosis of 13.1, no anemia noted.  CMP negative for electrolyte abnormalities, slight hyperglycemia of 123, no AKI, no anion gap present.  Lipase is 24, hCG less than 5.  UA shows proteins, moderate leukocytes, moderate white blood cells, few bacteria, few squamous cells.  I have low suspicion for systemic infection as patient is nontoxic-appearing, vital signs reassuring.  I suspect slight leukocytosis is secondary to acute phase reactants due to nausea.  Low suspicion for UTI or pyelonephritis as patient denies urinary symptoms, she had no CVA tenderness, I suspect abnormal UA is secondary to being contaminated with squamous cells and that patient is most likely dehydrated causing her to have  proteins and leukocytes.  Low suspicion for gallbladder abnormality as she has no right upper quadrant pain, low suspicion for gastric ulcer as she has no history, she has low risk factors.  Low suspicion for intra-abdominal abnormality requiring immediate intervention as patient's abdomen is soft, no peritoneal sign, tolerating p.o. without difficulty.  I suspect patient may be suffering from gastritis secondary to bad shellfish.  Will start patient on a PPI, provide her with antiemetics and have her follow-up with her primary care provider.   Vital signs have remained stable, no indication for hospital admission.  Patient given at home care as well strict return precautions.  Patient verbalized that they understood agreed to said plan.   Final Clinical Impression(s) / ED Diagnoses Final diagnoses:  Non-intractable vomiting with nausea, unspecified vomiting type    Rx / DC Orders ED Discharge Orders         Ordered    promethazine (PHENERGAN) 12.5 MG tablet  Every 8 hours PRN        12/30/19 1436    omeprazole (PRILOSEC) 20 MG capsule  Daily        12/30/19 1436           Barnie Del 12/30/19 1441    Benjiman Core, MD 12/30/19 1620

## 2020-04-05 ENCOUNTER — Ambulatory Visit (INDEPENDENT_AMBULATORY_CARE_PROVIDER_SITE_OTHER): Payer: 59 | Admitting: Family Medicine

## 2020-04-05 ENCOUNTER — Other Ambulatory Visit: Payer: Self-pay | Admitting: Family Medicine

## 2020-04-05 ENCOUNTER — Encounter: Payer: Self-pay | Admitting: Family Medicine

## 2020-04-05 ENCOUNTER — Other Ambulatory Visit: Payer: Self-pay

## 2020-04-05 VITALS — BP 122/72 | HR 81 | Temp 98.4°F | Ht 70.0 in | Wt 241.1 lb

## 2020-04-05 DIAGNOSIS — M797 Fibromyalgia: Secondary | ICD-10-CM

## 2020-04-05 DIAGNOSIS — J4530 Mild persistent asthma, uncomplicated: Secondary | ICD-10-CM

## 2020-04-05 DIAGNOSIS — H6982 Other specified disorders of Eustachian tube, left ear: Secondary | ICD-10-CM | POA: Diagnosis not present

## 2020-04-05 DIAGNOSIS — F411 Generalized anxiety disorder: Secondary | ICD-10-CM

## 2020-04-05 DIAGNOSIS — H6992 Unspecified Eustachian tube disorder, left ear: Secondary | ICD-10-CM

## 2020-04-05 DIAGNOSIS — Z3040 Encounter for surveillance of contraceptives, unspecified: Secondary | ICD-10-CM

## 2020-04-05 MED ORDER — AMITRIPTYLINE HCL 10 MG PO TABS: 10.0000 mg | ORAL_TABLET | Freq: Every day | ORAL | 2 refills | Status: DC

## 2020-04-05 MED ORDER — ALBUTEROL SULFATE HFA 108 (90 BASE) MCG/ACT IN AERS: 2.0000 | INHALATION_SPRAY | RESPIRATORY_TRACT | 1 refills | Status: DC | PRN

## 2020-04-05 MED ORDER — MONTELUKAST SODIUM 10 MG PO TABS: 10.0000 mg | ORAL_TABLET | Freq: Every day | ORAL | 3 refills | Status: DC

## 2020-04-05 MED ORDER — GABAPENTIN 300 MG PO CAPS: 300.0000 mg | ORAL_CAPSULE | Freq: Three times a day (TID) | ORAL | 2 refills | Status: DC

## 2020-04-05 MED ORDER — FLUTICASONE PROPIONATE 50 MCG/ACT NA SUSP: 2.0000 | Freq: Every day | NASAL | 2 refills | Status: DC

## 2020-04-05 MED ORDER — DROSPIRENONE-ETHINYL ESTRADIOL 3-0.02 MG PO TABS
1.0000 | ORAL_TABLET | Freq: Every day | ORAL | 3 refills | Status: DC
Start: 2020-04-05 — End: 2021-04-12

## 2020-04-05 NOTE — Progress Notes (Signed)
Chief Complaint  Patient presents with  . New Patient (Initial Visit)  . Medication Refill       New Patient Visit SUBJECTIVE: HPI: Carol Marquez is an 30 y.o.female who is being seen for establishing care.  The patient was previously seen at the [facility].  Fibromyalgia The patient has a several year history of fibromyalgia.  She was on Cymbalta in the past but did not like the way it made her feel.  She had her gallbladder out around 7 months ago.  Vicodin was not helpful but gabapentin did help relieve her symptoms.  She is not taking anything currently.  She is doing some walking but no routine exercise at this time.  She did not have any side effects with the gabapentin and is open to her own prescription.  Asthma The patient has a history of asthma.  She was on Advair in the past for maintenance but unfortunately cannot afford the inhaler.  She is using a rescue inhaler and states that it causes no issue she would want to use it around 4 times daily.  She is not currently coughing or wheezing.  Allergies do flare her symptoms.  She has never been on Singulair before.  She takes Allegra for her allergy symptoms.  She is not on a nasal spray at this time.  GAD/PTSD Patient has a history of anxiety, depression, and PTSD.  She used to be on Zoloft but her depression symptoms improved so she came off of it.  The main symptom she is having currently has poor sleep.  On a good night she will sleep for around 4 hours.  The patient mentioned fullness around her ears.  She saw ENT who examined her and do not feel that anything is wrong.  She will sometimes feel fullness in her ears.  She has had recurrent ear infections since she was 30 years old.  Nothing is draining from her ears.  She will have intermittent ringing at times.  Endometriosis The patient has a history of endometriosis.  She is currently on birth control that does help with the symptoms.  She has never had laparoscopy.   She does not currently have a gynecologist. . Past Medical History:  Diagnosis Date  . Asthma    Diagnosed age 51 or 30 yo.  . Calculus of gallbladder   . Endometriosis   . Fibromyalgia    Past Surgical History:  Procedure Laterality Date  . CHOLECYSTECTOMY N/A 09/01/2019   Procedure: LAPAROSCOPIC CHOLECYSTECTOMY;  Surgeon: Harriette Bouillon, MD;  Location: [facility] OR;  Service: General;  Laterality: N/A;  . WISDOM TOOTH EXTRACTION  2011   Family History  Problem Relation Age of Onset  . Healthy Mother   . Bipolar disorder Mother        ?Schizophrenia  . Healthy Father    Allergies  Allergen Reactions  . Shellfish Allergy Anaphylaxis and Hives  . Bee Venom Swelling  . Codeine Nausea Only    Current Outpatient Medications:  .  amitriptyline (ELAVIL) 10 MG tablet, Take 1 tablet (10 mg total) by mouth at bedtime., Disp: 30 tablet, Rfl: 2 .  Cranberry-Vitamin C (CRANBERRY CONCENTRATE/VITAMINC PO), Take 2 capsules by mouth daily., Disp: , Rfl:  .  diphenhydrAMINE (BENADRYL) 25 MG tablet, Take 50 mg by mouth daily as needed for itching or allergies., Disp: , Rfl:  .  fexofenadine (ALLEGRA) 180 MG tablet, Take 1 tablet (180 mg total) by mouth daily., Disp: , Rfl:  .  fluticasone (FLONASE) 50 MCG/ACT nasal spray, Place 2 sprays into both nostrils daily., Disp: 16 g, Rfl: 2 .  gabapentin (NEURONTIN) 300 MG capsule, Take 1 capsule (300 mg total) by mouth 3 (three) times daily., Disp: 90 capsule, Rfl: 2 .  ibuprofen (ADVIL) 200 MG tablet, Take 400-800 mg by mouth every 8 (eight) hours as needed for moderate pain. , Disp: , Rfl:  .  montelukast (SINGULAIR) 10 MG tablet, Take 1 tablet (10 mg total) by mouth at bedtime., Disp: 30 tablet, Rfl: 3 .  Multiple Vitamins-Minerals (HAIR SKIN AND NAILS FORMULA) TABS, Take 1 tablet by mouth daily., Disp: , Rfl:  .  albuterol (VENTOLIN HFA) 108 (90 Base) MCG/ACT inhaler, Inhale 2 puffs into the lungs every 4 (four) hours as needed for wheezing or shortness  of breath., Disp: 18 g, Rfl: 1 .  drospirenone-ethinyl estradiol (YAZ) 3-0.02 MG tablet, Take 1 tablet by mouth at bedtime., Disp: 84 tablet, Rfl: 3  OBJECTIVE: BP 122/72 (BP Location: Left Arm, Patient Position: Sitting, Cuff Size: Large)   Pulse 81   Temp 98.4 F (36.9 C) (Oral)   Ht 5\' 10"  (1.778 m)   Wt 241 lb 2 oz (109.4 kg)   SpO2 99%   BMI 34.60 kg/m  General:  well developed, well nourished, in no apparent distress Skin:  no significant moles, warts, or growths; I do not appreciate any asymmetry or nodules submandibularly, preauricularly, or postauricularly Nose:  nares patent, septum midline, mucosa normal Ears: Slightly retracted TM on the left, normal canals bilaterally, normal TM on the right Throat/Pharynx:  lips and gingiva without lesion; tongue and uvula midline; non-inflamed pharynx; no exudates or postnasal drainage Lungs:  clear to auscultation, breath sounds equal bilaterally, no respiratory distress Cardio:  regular rate and rhythm, no LE edema or bruits Musculoskeletal: Various bilateral tender points in the upper extremities and lower extremities in addition to the back. Neuro:  gait normal Psych: well oriented with normal range of affect and appropriate judgment/insight  ASSESSMENT/PLAN: Fibromyalgia - Plan: gabapentin (NEURONTIN) 300 MG capsule, amitriptyline (ELAVIL) 10 MG tablet  Mild persistent asthma without complication - Plan: albuterol (VENTOLIN HFA) 108 (90 Base) MCG/ACT inhaler, montelukast (SINGULAIR) 10 MG tablet  GAD (generalized anxiety disorder) - Plan: amitriptyline (ELAVIL) 10 MG tablet  Dysfunction of left eustachian tube - Plan: fluticasone (FLONASE) 50 MCG/ACT nasal spray  Encounter for surveillance of contraceptives, unspecified contraceptive - Plan: drospirenone-ethinyl estradiol (YAZ) 3-0.02 MG tablet  1.  Start gabapentin and Elavil.  Counseled on exercise.  A list of foods that may affect pain were provided.  Fibro guide link also  provided. 2.  Continue albuterol as needed.  Stop Advair due to cost.  Start Singulair. 3.  Start Elavil 10 mg nightly. 4.  Start Flonase, continue Allegra. 5.  Continue Yaz.  She does have a history of endometriosis as well.  I gave her the contact information for the gynecology team across the hall. Patient should return in 1 month recheck. The patient voiced understanding and agreement to the plan.  Greater than 60 minutes were spent with the patient in addition to reviewing their chart information on the same day of the visit.    01-26-1996 [place], DO 04/05/20  3:41 PM

## 2020-04-05 NOTE — Patient Instructions (Addendum)
[email or web address].it  Foods that may reduce pain: 1) Ginger 2) Blueberries 3) Salmon 4) Pumpkin seeds 5) dark chocolate 6) turmeric 7) tart cherries 8) virgin olive oil 9) chilli peppers 10) mint 11) red wine  Call Center for [facility] at [facility] at [phone] for an appointment.  They are located at [address], [address], [place], [place], [address] (right across the hall from our office).  Let me know if there are cost issues with the medication.  Heat (pad or rice pillow in microwave) over affected area, 10-15 minutes twice daily.   Ice/cold pack over area for 10-15 min twice daily.  Keep the diet clean and stay active.  Let us know if you need anything.  EXERCISES  RANGE OF MOTION (ROM) AND STRETCHING EXERCISES These exercises may help you when beginning to rehabilitate your injury. While completing these exercises, remember:   Restoring tissue flexibility helps normal motion to return to the joints. This allows healthier, less painful movement and activity.  An effective stretch should be held for at least 30 seconds.  A stretch should never be painful. You should only feel a gentle lengthening or release in the stretched tissue.  ROM - Pendulum  Bend at the waist so that your right / left arm falls away from your body. Support yourself with your opposite hand on a solid surface, such as a table or a countertop.  Your right / left arm should be perpendicular to the ground. If it is not perpendicular, you need to lean over farther. Relax the muscles in your right / left arm and shoulder as much as possible.  Gently sway your hips and trunk so they move your right / left arm without any use of your right / left shoulder muscles.  Progress your movements so that your right / left arm moves side to side, then forward and backward, and finally, both clockwise and counterclockwise.  Complete 10-15 repetitions in each direction.  Many people use this exercise to relieve discomfort in their shoulder as well as to gain range of motion. Repeat 2 times. Complete this exercise 3 times per week.  STRETCH - Flexion, Standing  Stand with good posture. With an underhand grip on your right / left hand and an overhand grip on the opposite hand, grasp a broomstick or cane so that your hands are a little more than shoulder-width apart.  Keeping your right / left elbow straight and shoulder muscles relaxed, push the stick with your opposite hand to raise your right / left arm in front of your body and then overhead. Raise your arm until you feel a stretch in your right / left shoulder, but before you have increased shoulder pain.  Try to avoid shrugging your right / left shoulder as your arm rises by keeping your shoulder blade tucked down and toward your mid-back spine. Hold 30 seconds.  Slowly return to the starting position. Repeat 2 times. Complete this exercise 3 times per week.  STRETCH - Internal Rotation  Place your right / left hand behind your back, palm-up.  Throw a towel or belt over your opposite shoulder. Grasp the towel/belt with your right / left hand.  While keeping an upright posture, gently pull up on the towel/belt until you feel a stretch in the front of your right / left shoulder.  Avoid shrugging your right / left shoulder as your arm rises by keeping your shoulder blade tucked down and toward your mid-back spine.  Hold 30. Release the stretch by lowering your opposite hand. Repeat 2 times. Complete this exercise 3 times per week.  STRETCH - External Rotation and Abduction  Stagger your stance through a doorframe. It does not matter which foot is forward.  As instructed by your physician, physical therapist or athletic trainer, place your hands: ? And forearms above your head and on the door frame. ? And forearms at head-height and on the door frame. ? At elbow-height and on the door  frame.  Keeping your head and chest upright and your stomach muscles tight to prevent over-extending your low-back, slowly shift your weight onto your front foot until you feel a stretch across your chest and/or in the front of your shoulders.  Hold 30 seconds. Shift your weight to your back foot to release the stretch. Repeat 2 times. Complete this stretch 3 times per week.   STRENGTHENING EXERCISES  These exercises may help you when beginning to rehabilitate your injury. They may resolve your symptoms with or without further involvement from your physician, physical therapist or athletic trainer. While completing these exercises, remember:   Muscles can gain both the endurance and the strength needed for everyday activities through controlled exercises.  Complete these exercises as instructed by your physician, physical therapist or athletic trainer. Progress the resistance and repetitions only as guided.  You may experience muscle soreness or fatigue, but the pain or discomfort you are trying to eliminate should never worsen during these exercises. If this pain does worsen, stop and make certain you are following the directions exactly. If the pain is still present after adjustments, discontinue the exercise until you can discuss the trouble with your clinician.  If advised by your physician, during your recovery, avoid activity or exercises which involve actions that place your right / left hand or elbow above your head or behind your back or head. These positions stress the tissues which are trying to heal.  STRENGTH - Scapular Depression and Adduction  With good posture, sit on a firm chair. Supported your arms in front of you with pillows, arm rests or a table top. Have your elbows in line with the sides of your body.  Gently draw your shoulder blades down and toward your mid-back spine. Gradually increase the tension without tensing the muscles along the top of your shoulders and the  back of your neck.  Hold for 3 seconds. Slowly release the tension and relax your muscles completely before completing the next repetition.  After you have practiced this exercise, remove the arm support and complete it in standing as well as sitting. Repeat 2 times. Complete this exercise 3 times per week.   STRENGTH - External Rotators  Secure a rubber exercise band/tubing to a fixed object so that it is at the same height as your right / left elbow when you are standing or sitting on a firm surface.  Stand or sit so that the secured exercise band/tubing is at your side that is not injured.  Bend your elbow 90 degrees. Place a folded towel or small pillow under your right / left arm so that your elbow is a few inches away from your side.  Keeping the tension on the exercise band/tubing, pull it away from your body, as if pivoting on your elbow. Be sure to keep your body steady so that the movement is only coming from your shoulder rotating.  Hold 3 seconds. Release the tension in a controlled manner as you return to the  starting position. Repeat 2 times. Complete this exercise 3 times per week.   STRENGTH - Supraspinatus  Stand or sit with good posture. Grasp a 2-3 lb weight or an exercise band/tubing so that your hand is "thumbs-up," like when you shake hands.  Slowly lift your right / left hand from your thigh into the air, traveling about 30 degrees from straight out at your side. Lift your hand to shoulder height or as far as you can without increasing any shoulder pain. Initially, many people do not lift their hands above shoulder height.  Avoid shrugging your right / left shoulder as your arm rises by keeping your shoulder blade tucked down and toward your mid-back spine.  Hold for 3 seconds. Control the descent of your hand as you slowly return to your starting position. Repeat 2 times. Complete this exercise 3 times per week.   STRENGTH - Shoulder Extensors  Secure a rubber  exercise band/tubing so that it is at the height of your shoulders when you are either standing or sitting on a firm arm-less chair.  With a thumbs-up grip, grasp an end of the band/tubing in each hand. Straighten your elbows and lift your hands straight in front of you at shoulder height. Step back away from the secured end of band/tubing until it becomes tense.  Squeezing your shoulder blades together, pull your hands down to the sides of your thighs. Do not allow your hands to go behind you.  Hold for 3 seconds. Slowly ease the tension on the band/tubing as you reverse the directions and return to the starting position. Repeat 2 times. Complete this exercise 3 times per week.   STRENGTH - Scapular Retractors  Secure a rubber exercise band/tubing so that it is at the height of your shoulders when you are either standing or sitting on a firm arm-less chair.  With a palm-down grip, grasp an end of the band/tubing in each hand. Straighten your elbows and lift your hands straight in front of you at shoulder height. Step back away from the secured end of band/tubing until it becomes tense.  Squeezing your shoulder blades together, draw your elbows back as you bend them. Keep your upper arm lifted away from your body throughout the exercise.  Hold 3 seconds. Slowly ease the tension on the band/tubing as you reverse the directions and return to the starting position. Repeat 2 times. Complete this exercise 3 times per week.  STRENGTH - Scapular Depressors  Find a sturdy chair without wheels, such as a from a dining room table.  Keeping your feet on the floor, lift your bottom from the seat and lock your elbows.  Keeping your elbows straight, allow gravity to pull your body weight down. Your shoulders will rise toward your ears.  Raise your body against gravity by drawing your shoulder blades down your back, shortening the distance between your shoulders and ears. Although your feet should  always maintain contact with the floor, your feet should progressively support less body weight as you get stronger.  Hold 3 seconds. In a controlled and slow manner, lower your body weight to begin the next repetition. Repeat 2 times. Complete this exercise 3 times per week.   This information is not intended to replace advice given to you by your health care provider. Make sure you discuss any questions you have with your health care provider.  Document Released: 11/08/2004 Document Revised: 01/15/2014 Document Reviewed: 04/08/2008 Elsevier Interactive Patient Education [employer]

## 2020-05-06 ENCOUNTER — Ambulatory Visit: Payer: 59 | Admitting: Family Medicine

## 2020-05-25 ENCOUNTER — Ambulatory Visit: Payer: 59 | Admitting: Family Medicine

## 2020-05-25 ENCOUNTER — Encounter: Payer: Self-pay | Admitting: Family Medicine

## 2020-05-25 ENCOUNTER — Other Ambulatory Visit: Payer: Self-pay

## 2020-05-25 VITALS — BP 124/82 | HR 91 | Temp 98.2°F | Resp 18 | Ht 70.0 in | Wt 238.5 lb

## 2020-05-25 DIAGNOSIS — J4530 Mild persistent asthma, uncomplicated: Secondary | ICD-10-CM

## 2020-05-25 DIAGNOSIS — J302 Other seasonal allergic rhinitis: Secondary | ICD-10-CM | POA: Diagnosis not present

## 2020-05-25 DIAGNOSIS — M797 Fibromyalgia: Secondary | ICD-10-CM | POA: Diagnosis not present

## 2020-05-25 NOTE — Progress Notes (Signed)
Chief Complaint  Patient presents with  . Fibromyalgia  . Follow-up    Subjective: Patient is a 30 y.o. female here for f/u.  Started on Elavil 10 mg/d for FM.  She reports approximately 70 to 80% improvement.  She reports compliance with the medication and no adverse effects.  She is exercising daily with taking care of her dogs and walking her dog.  Mood is controlled on the Elavil.  Asthma stable after starting Singulair 10 mg daily.  Reports compliance, no adverse effects.  She is also taking Singulair and Flonase.  Here ears are still painful but other symptoms are controlled.  She denies any fevers or drainage from her ears.  Past Medical History:  Diagnosis Date  . Asthma    Diagnosed age 38 or 30 yo.  . Calculus of gallbladder   . Endometriosis   . Fibromyalgia     Objective: BP 124/82 (BP Location: Right Arm, Patient Position: Sitting, Cuff Size: Large)   Pulse 91   Temp 98.2 F (36.8 C) (Oral)   Resp 18   Ht 5\' 10"  (1.778 m)   Wt 238 lb 8 oz (108.2 kg)   SpO2 97%   BMI 34.22 kg/m  General: Awake, appears stated age HEENT: MMM, EOMi, canals patent, TMs negative bilaterally, nares patent without rhinorrhea Heart: RRR Lungs: CTAB, no rales, wheezes or rhonchi. No accessory muscle use Psych: Age appropriate judgment and insight, normal affect and mood  Assessment and Plan: Fibromyalgia  Mild persistent asthma without complication  Seasonal allergies  1.  Chronic, controlled.  Continue Elavil 10 mg daily.  Counseled on exercise. 2.  Chronic, controlled.  Continue Singulair 10 mg daily, albuterol as needed. 3.  Chronic, uncontrolled.  Continue Allegra, Flonase, and Singulair.  If no improvement with the ear pain over the next couple weeks, she will send me a message and I will refer her to ENT. Unless needed, I will see her in 6 months for physical. The patient voiced understanding and agreement to the plan.  [place], DO 05/25/20  1:13  PM

## 2020-05-25 NOTE — Patient Instructions (Signed)
Send me a message in a few weeks if no improvement with your ears and we can set you up with an ENT specialist.  Continue all of your medications.  Keep moving.  Let us know if you need anything.

## 2020-06-02 ENCOUNTER — Ambulatory Visit: Payer: Self-pay | Admitting: Family

## 2020-08-19 ENCOUNTER — Telehealth: Payer: Self-pay

## 2020-08-19 ENCOUNTER — Other Ambulatory Visit: Payer: Self-pay | Admitting: Family Medicine

## 2020-08-19 DIAGNOSIS — F411 Generalized anxiety disorder: Secondary | ICD-10-CM

## 2020-08-19 DIAGNOSIS — M797 Fibromyalgia: Secondary | ICD-10-CM

## 2020-08-19 NOTE — Telephone Encounter (Signed)
Medication has been refilled.

## 2020-08-19 NOTE — Telephone Encounter (Signed)
Medication: amitriptyline  Has the patient contacted their pharmacy? Yes.    (If no, request that the patient contact the pharmacy for the refill.) (If yes, when and what did the pharmacy advise?) Pharmacy said they sent the request for refill over to office last week  Preferred Pharmacy (with phone number or street name): [facility]  Agent: Please be advised that RX refills may take up to 3 business days. We ask that you follow-up with your pharmacy.  *Pt is out of medication*

## 2020-08-20 ENCOUNTER — Other Ambulatory Visit: Payer: Self-pay | Admitting: Family Medicine

## 2020-08-20 DIAGNOSIS — J4530 Mild persistent asthma, uncomplicated: Secondary | ICD-10-CM

## 2020-09-08 IMAGING — DX DG CHEST 2V
2 series · 2 of 2 positions shown · non-contrast
Comparison: 11/01/2015

CLINICAL DATA: Chest pain

EXAM:
CHEST - 2 VIEW

[w chest pa]
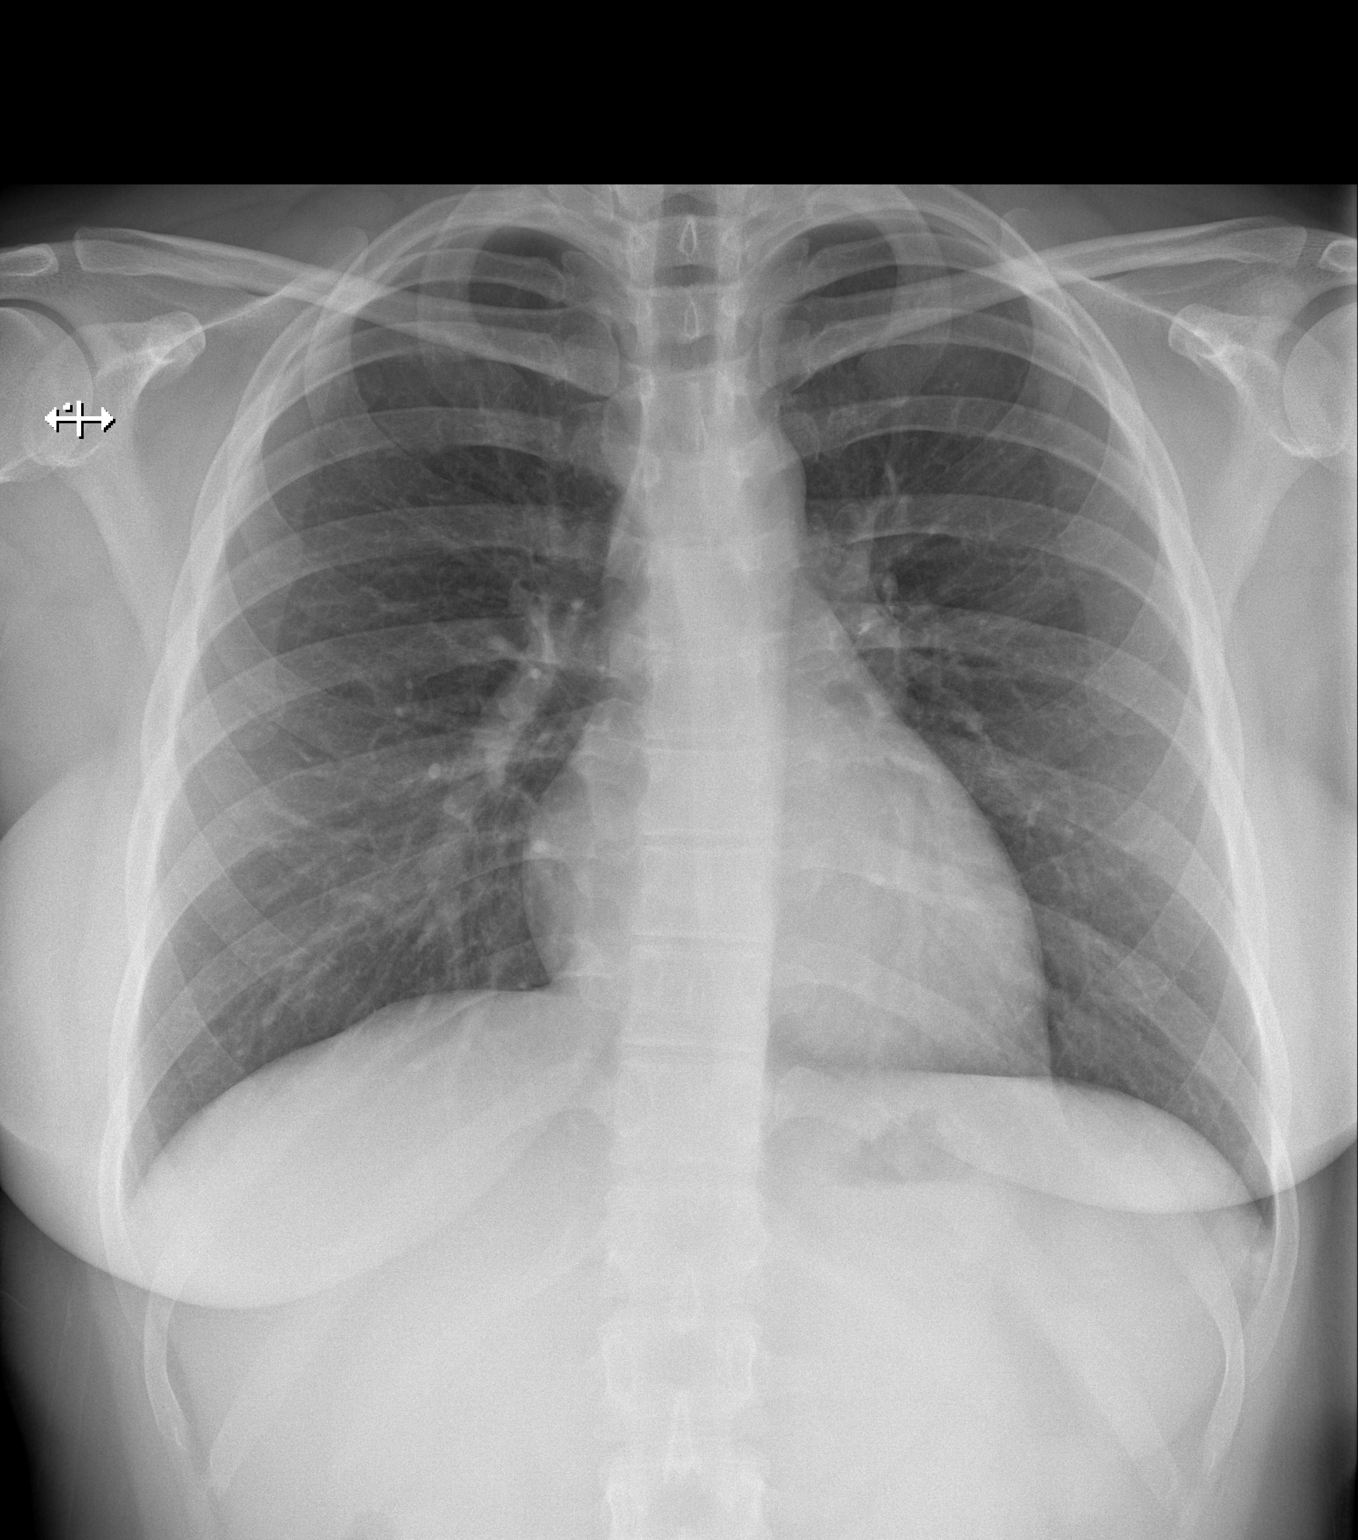

[w chest lat]
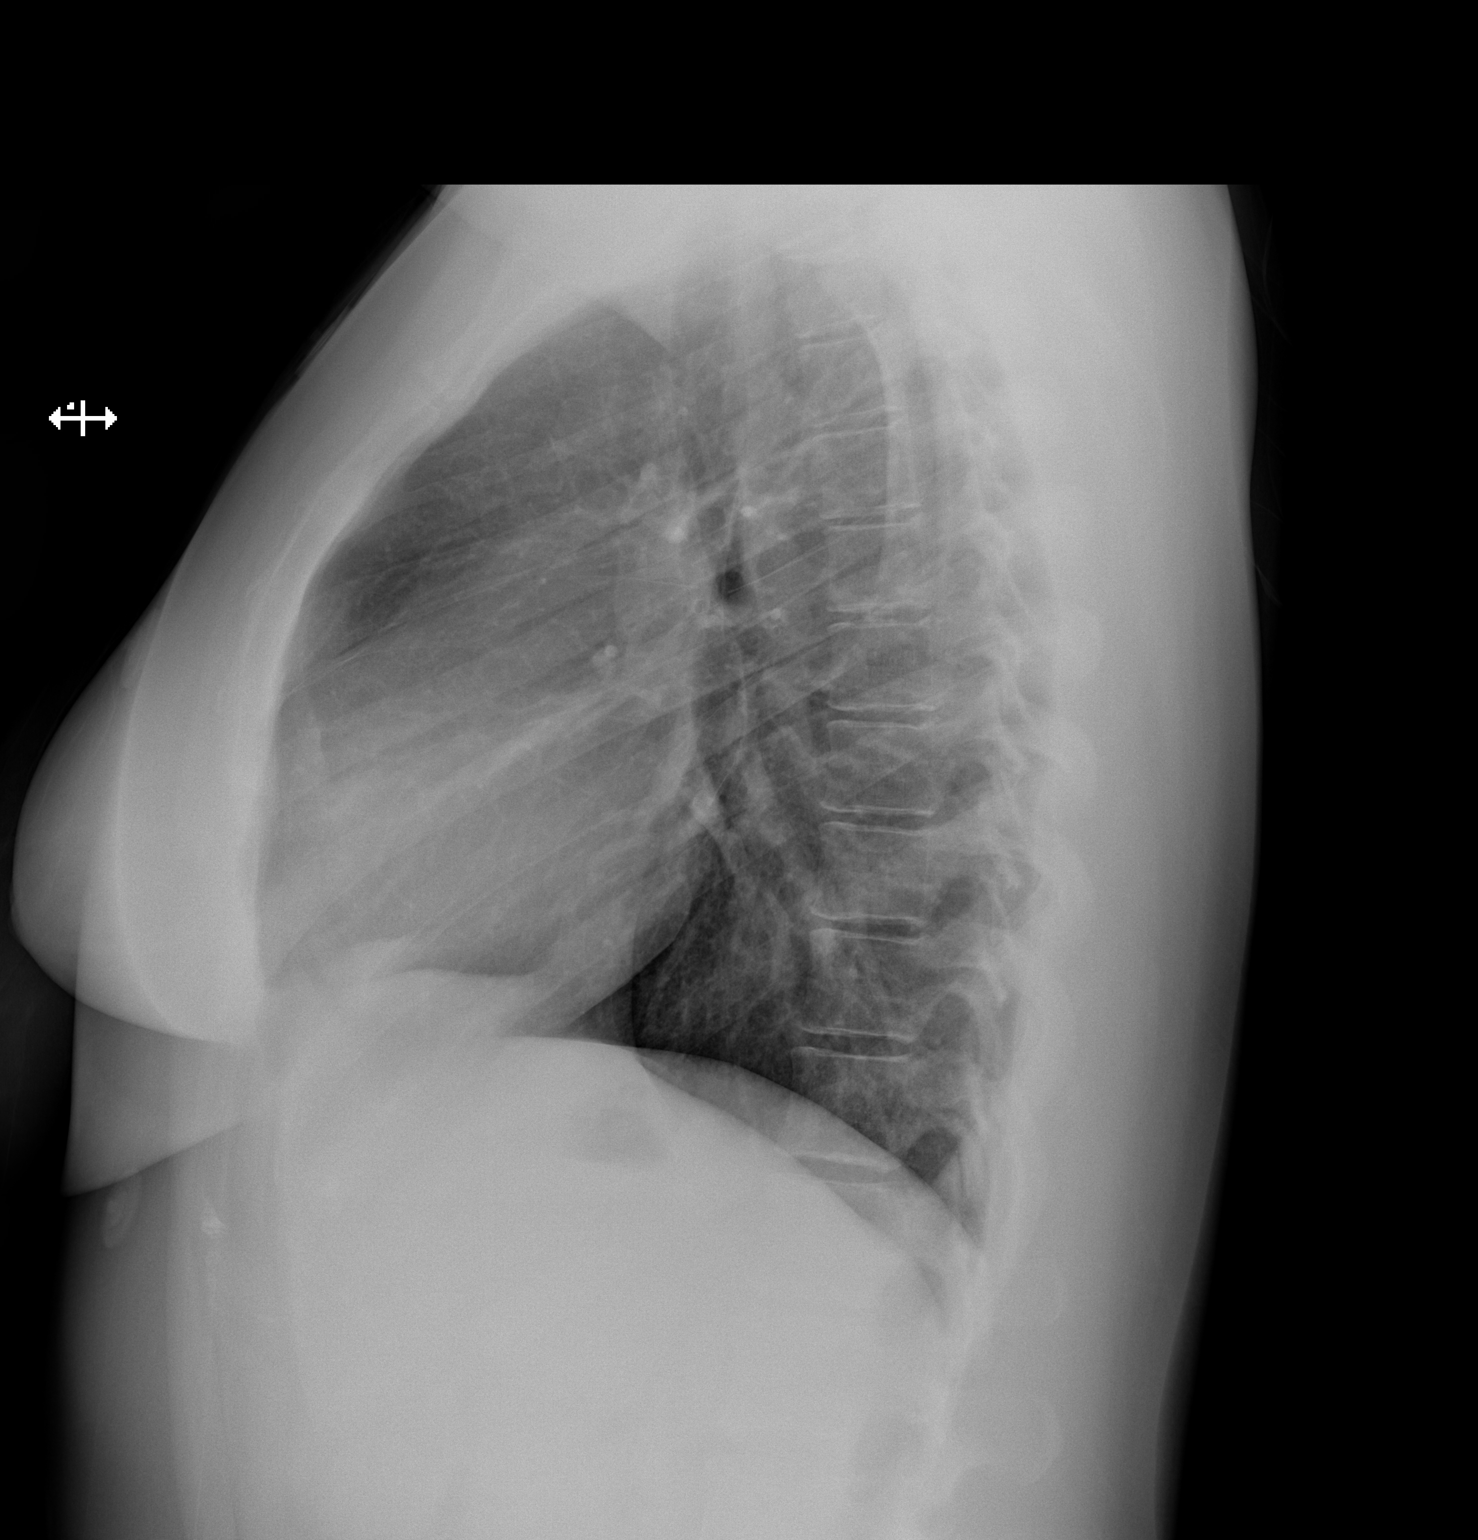

[2 of 2 positions shown; findings below may reference images not displayed]

FINDINGS: The heart size and mediastinal contours are within normal limits.
Both lungs are clear. The visualized skeletal structures are
unremarkable.
IMPRESSION: Unremarkable chest radiographs.

## 2020-11-24 ENCOUNTER — Other Ambulatory Visit: Payer: Self-pay

## 2020-11-25 ENCOUNTER — Encounter: Payer: Self-pay | Admitting: Family Medicine

## 2020-11-25 ENCOUNTER — Other Ambulatory Visit (HOSPITAL_COMMUNITY)
Admission: RE | Admit: 2020-11-25 | Discharge: 2020-11-25 | Disposition: A | Discharge status: Home / Self Care | Payer: 59 | Source: Ambulatory Visit | Attending: Family Medicine | Admitting: Family Medicine

## 2020-11-25 ENCOUNTER — Ambulatory Visit (INDEPENDENT_AMBULATORY_CARE_PROVIDER_SITE_OTHER): Payer: 59 | Admitting: Family Medicine

## 2020-11-25 VITALS — BP 120/78 | HR 81 | Temp 97.9°F | Ht 70.0 in | Wt 238.2 lb

## 2020-11-25 DIAGNOSIS — F411 Generalized anxiety disorder: Secondary | ICD-10-CM

## 2020-11-25 DIAGNOSIS — Z114 Encounter for screening for human immunodeficiency virus [HIV]: Secondary | ICD-10-CM | POA: Diagnosis not present

## 2020-11-25 DIAGNOSIS — Z113 Encounter for screening for infections with a predominantly sexual mode of transmission: Secondary | ICD-10-CM | POA: Insufficient documentation

## 2020-11-25 DIAGNOSIS — M797 Fibromyalgia: Secondary | ICD-10-CM | POA: Diagnosis not present

## 2020-11-25 DIAGNOSIS — Z0000 Encounter for general adult medical examination without abnormal findings: Secondary | ICD-10-CM | POA: Diagnosis not present

## 2020-11-25 DIAGNOSIS — J4530 Mild persistent asthma, uncomplicated: Secondary | ICD-10-CM

## 2020-11-25 LAB — COMPREHENSIVE METABOLIC PANEL
ALT: 14 U/L (ref 0–35)
AST: 13 U/L (ref 0–37)
Albumin: 4.3 g/dL (ref 3.5–5.2)
Alkaline Phosphatase: 57 U/L (ref 39–117)
BUN: 15 mg/dL (ref 6–23)
CO2: 26 mEq/L (ref 19–32)
Calcium: 9.3 mg/dL (ref 8.4–10.5)
Chloride: 102 mEq/L (ref 96–112)
Creatinine, Ser: 0.91 mg/dL (ref 0.40–1.20)
GFR: 84.77 mL/min (ref >60.00)
Glucose, Bld: 93 mg/dL (ref 70–99)
Potassium: 4.1 mEq/L (ref 3.5–5.1)
Sodium: 136 mEq/L (ref 135–145)
Total Bilirubin: 0.7 mg/dL (ref 0.2–1.2)
Total Protein: 7 g/dL (ref 6.0–8.3)

## 2020-11-25 LAB — CBC
HCT: 40 % (ref 36.0–46.0)
Hemoglobin: 13.4 g/dL (ref 12.0–15.0)
MCHC: 33.5 g/dL (ref 30.0–36.0)
MCV: 88.5 fl (ref 78.0–100.0)
Platelets: 234 10*3/uL (ref 150.0–400.0)
RBC: 4.52 Mil/uL (ref 3.87–5.11)
RDW: 12.5 % (ref 11.5–15.5)
WBC: 6.3 10*3/uL (ref 4.0–10.5)

## 2020-11-25 LAB — LIPID PANEL
Cholesterol: 165 mg/dL (ref 0–200)
HDL: 63.2 mg/dL (ref >39.00)
LDL Cholesterol: 76 mg/dL (ref 0–99)
NonHDL: 101.92
Total CHOL/HDL Ratio: 3
Triglycerides: 132 mg/dL (ref 0.0–149.0)
VLDL: 26.4 mg/dL (ref 0.0–40.0)

## 2020-11-25 MED ORDER — MONTELUKAST SODIUM 10 MG PO TABS
ORAL_TABLET | ORAL | 2 refills | Status: DC
Start: 2020-11-25 — End: 2021-10-13

## 2020-11-25 MED ORDER — AMITRIPTYLINE HCL 10 MG PO TABS: ORAL_TABLET | ORAL | 2 refills | Status: DC

## 2020-11-25 MED ORDER — AZELASTINE HCL 0.1 % NA SOLN: 2.0000 | Freq: Two times a day (BID) | NASAL | 12 refills | Status: AC

## 2020-11-25 MED ORDER — GABAPENTIN 300 MG PO CAPS: 300.0000 mg | ORAL_CAPSULE | Freq: Three times a day (TID) | ORAL | 10 refills | Status: DC

## 2020-11-25 NOTE — Progress Notes (Signed)
Chief Complaint  Patient presents with   Follow-up   Exposure to STD    Testing      Well Woman Carol Marquez is here for a complete physical.   Her last physical was >1 year ago.  Current diet: in general, a "healthy" diet. Current exercise: lifting, cardio. Contraception? Yes Fatigue out of ordinary? No Seatbelt? Yes  Health Maintenance Pap/HPV- Yes Tetanus- Yes HIV screening- Yes Hep C screening- Yes  Past Medical History:  Diagnosis Date   Asthma    Diagnosed age 4 or 30 yo.   Calculus of gallbladder    Endometriosis    Fibromyalgia      Past Surgical History:  Procedure Laterality Date   CHOLECYSTECTOMY N/A 09/01/2019   Procedure: LAPAROSCOPIC CHOLECYSTECTOMY;  Surgeon: Harriette Bouillon, MD;  Location: [facility] OR;  Service: General;  Laterality: N/A;   WISDOM TOOTH EXTRACTION  2011    Medications  Current Outpatient Medications on File Prior to Visit  Medication Sig Dispense Refill   albuterol (VENTOLIN HFA) 108 (90 Base) MCG/ACT inhaler INHALE 2 PUFFS INTO THE LUNGS EVERY 4 HOURS AS NEEDED FOR WHEEZING OR SHORTNESS OF BREATH 90 g 0   amitriptyline (ELAVIL) 10 MG tablet TAKE 1 TABLET(10 MG) BY MOUTH AT BEDTIME 30 tablet 2   Cranberry-Vitamin C (CRANBERRY CONCENTRATE/VITAMINC PO) Take 2 capsules by mouth daily.     diphenhydrAMINE (BENADRYL) 25 MG tablet Take 50 mg by mouth daily as needed for itching or allergies.     drospirenone-ethinyl estradiol (YAZ) 3-0.02 MG tablet Take 1 tablet by mouth at bedtime. 84 tablet 3   fexofenadine (ALLEGRA) 180 MG tablet Take 1 tablet (180 mg total) by mouth daily.     fluticasone (FLONASE) 50 MCG/ACT nasal spray Place 2 sprays into both nostrils daily. 16 g 2   gabapentin (NEURONTIN) 300 MG capsule Take 1 capsule (300 mg total) by mouth 3 (three) times daily. 90 capsule 2   ibuprofen (ADVIL) 200 MG tablet Take 400-800 mg by mouth every 8 (eight) hours as needed for moderate pain.      montelukast (SINGULAIR) 10 MG tablet TAKE 1  TABLET(10 MG) BY MOUTH AT BEDTIME 30 tablet 3   Multiple Vitamins-Minerals (HAIR SKIN AND NAILS FORMULA) TABS Take 1 tablet by mouth daily.     [DISCONTINUED] metoCLOPramide (REGLAN) 10 MG tablet Take 1 tablet (10 mg total) by mouth every 8 (eight) hours as needed for nausea. 15 tablet 0    Allergies Allergies  Allergen Reactions   Shellfish Allergy Anaphylaxis and Hives   Bee Venom Swelling   Codeine Nausea Only    Review of Systems: Constitutional:  no unexpected weight changes Eye:  no recent significant change in vision Ear/Nose/Mouth/Throat:  Ears:  no tinnitus or vertigo and no recent change in hearing Nose/Mouth/Throat:  no complaints of nasal congestion, no sore throat Cardiovascular: no chest pain Respiratory:  no cough and no shortness of breath Gastrointestinal:  no abdominal pain, no change in bowel habits GU:  Female: negative for dysuria or pelvic pain Musculoskeletal/Extremities:  no new pain of the joints Integumentary (Skin/Breast):  no abnormal skin lesions reported Neurologic:  no headaches Endocrine:  denies fatigue Hematologic/Lymphatic:  No areas of easy bleeding  Exam BP 120/78   Pulse 81   Temp 97.9 F (36.6 C) (Oral)   Ht 5\' 10"  (1.778 m)   Wt 238 lb 4 oz (108.1 kg)   SpO2 99%   BMI 34.19 kg/m  General:  well developed, well nourished,  in no apparent distress Skin:  no significant moles, warts, or growths Head:  no masses, lesions, or tenderness Eyes:  pupils equal and round, sclera anicteric without injection Ears:  canals without lesions, TMs shiny without retraction, no obvious effusion, no erythema Nose:  nares patent, septum midline, mucosa normal, and no drainage or sinus tenderness Throat/Pharynx:  lips and gingiva without lesion; tongue and uvula midline; non-inflamed pharynx; no exudates or postnasal drainage Neck: neck supple without adenopathy, thyromegaly, or masses Lungs:  clear to auscultation, breath sounds equal bilaterally, no  respiratory distress Cardio:  regular rate and rhythm, no bruits, no LE edema Abdomen:  abdomen soft, nontender; bowel sounds normal; no masses or organomegaly Genital: Defer to GYN Musculoskeletal:  symmetrical muscle groups noted without atrophy or deformity Extremities:  no clubbing, cyanosis, or edema, no deformities, no skin discoloration Neuro:  gait normal; deep tendon reflexes normal and symmetric Psych: well oriented with normal range of affect and appropriate judgment/insight  Assessment and Plan  Well adult exam - Plan: CBC, Comprehensive metabolic panel, Lipid panel  Routine screening for STI (sexually transmitted infection) - Plan: Urine cytology ancillary only([name])  Screening for HIV without presence of risk factors - Plan: HIV Antibody (routine testing w rflx)  Fibromyalgia - Plan: amitriptyline (ELAVIL) 10 MG tablet, gabapentin (NEURONTIN) 300 MG capsule  GAD (generalized anxiety disorder) - Plan: amitriptyline (ELAVIL) 10 MG tablet  Mild persistent asthma without complication - Plan: montelukast (SINGULAIR) 10 MG tablet   Well 30 y.o. female. Counseled on diet and exercise. Other orders as above. Covid bivalent booster rec'd.  GYN info provided.  Follow up in 6 mo or prn. The patient voiced understanding and agreement to the plan.  Carol Roche [place], DO 11/25/20 8:20 AM

## 2020-11-25 NOTE — Patient Instructions (Addendum)
Give [place] 2-3 business days to get the results of your labs back.   Keep the diet clean and stay active.  I recommend getting the updated bivalent covid vaccination booster at your convenience.   Call Center for [facility] at [facility] at [phone] for an appointment.  They are located at [address], [address], [place], [place], [address] (right across the hall from our office).  Let us know if you need anything.

## 2020-11-28 LAB — URINE CYTOLOGY ANCILLARY ONLY
Chlamydia: NEGATIVE
Comment: NEGATIVE
Comment: NEGATIVE
Comment: NORMAL
Neisseria Gonorrhea: NEGATIVE
Trichomonas: NEGATIVE

## 2020-11-28 LAB — HIV ANTIBODY (ROUTINE TESTING W REFLEX): HIV 1&2 Ab, 4th Generation: NONREACTIVE

## 2020-12-06 IMAGING — US US ABDOMEN LIMITED
1 series · 14 of 25 positions shown · non-contrast
Comparison: December 18, 2018

CLINICAL DATA: Right upper quadrant abdomen pain for 1 day

EXAM:
ULTRASOUND ABDOMEN LIMITED RIGHT UPPER QUADRANT

[Series 1: us abdomen limited · 14 of 59 slices shown]
[im 1/59]
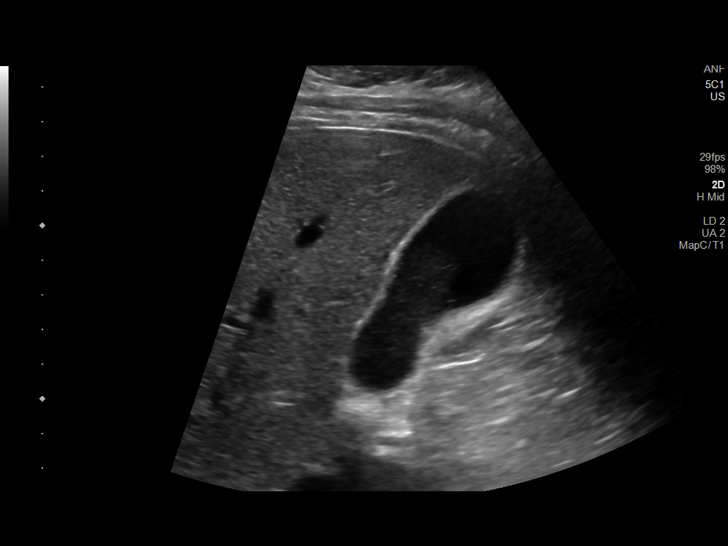
[im 5/59]
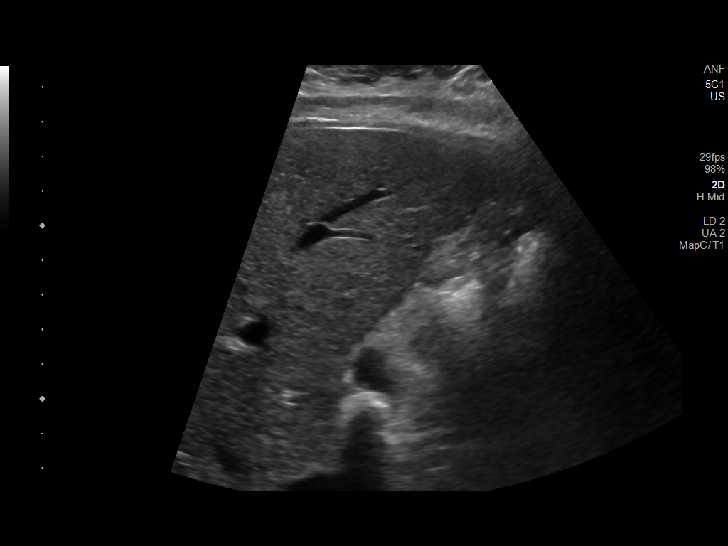
[im 10/59]
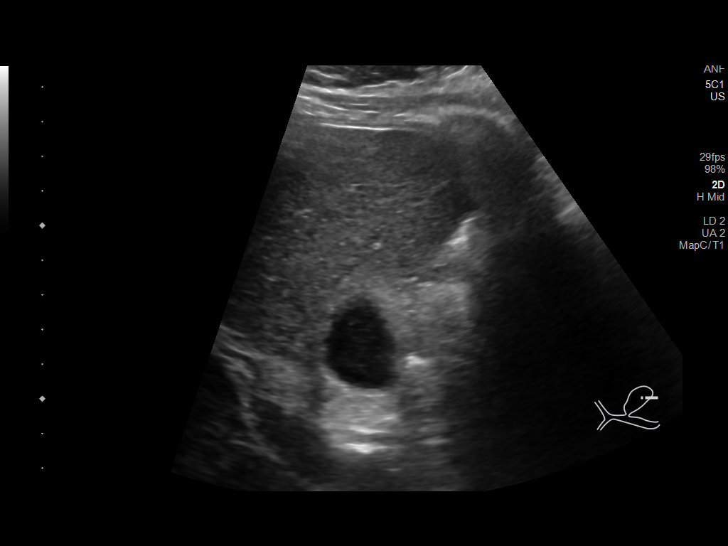
[im 15/59]
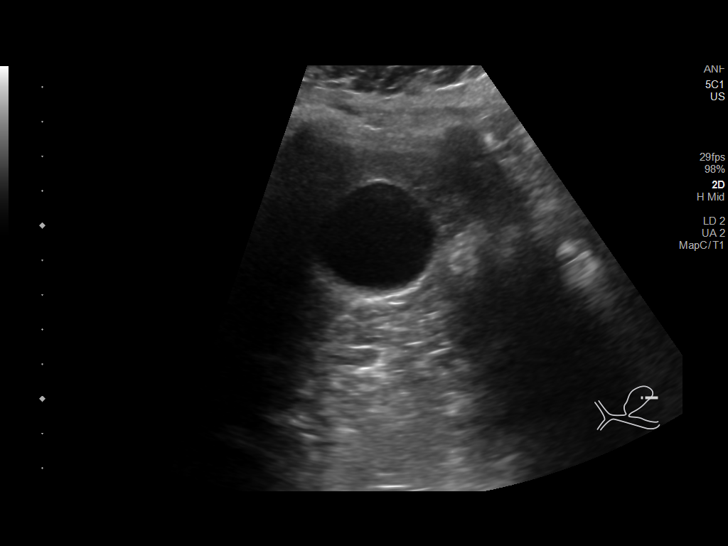
[im 20/59]
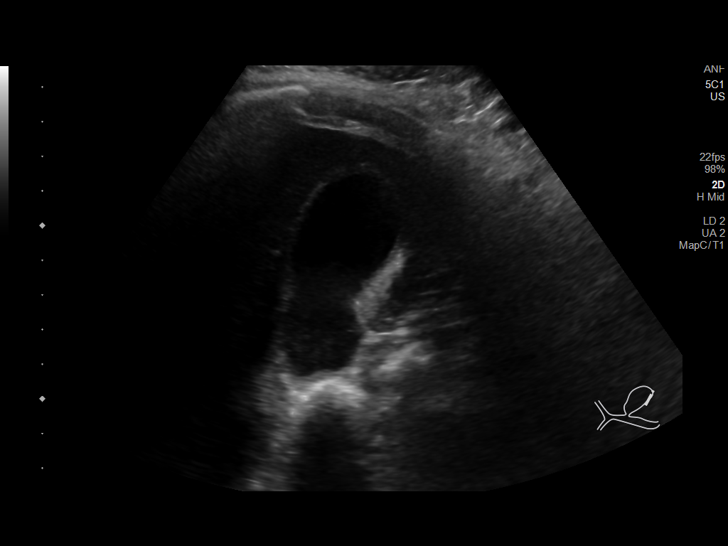
[im 22/59]
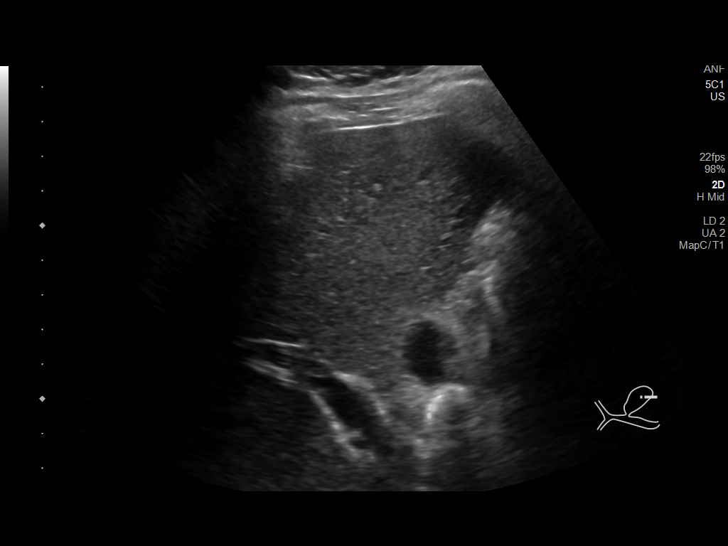
[im 27/59]
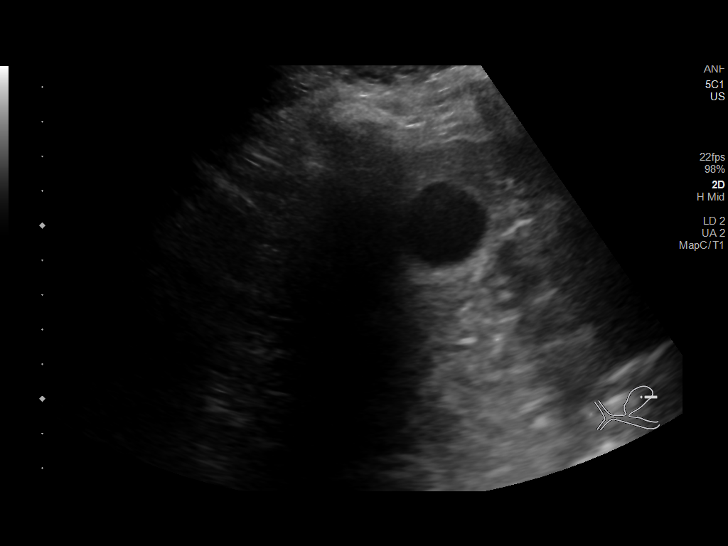
[im 32/59]
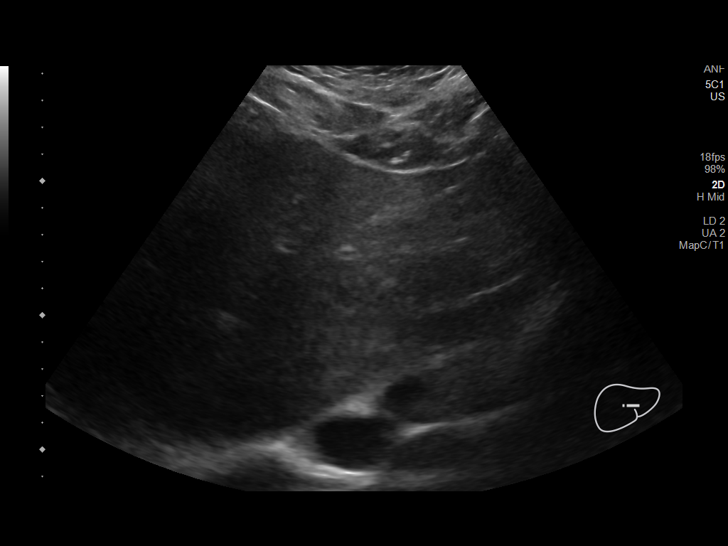
[im 37/59]
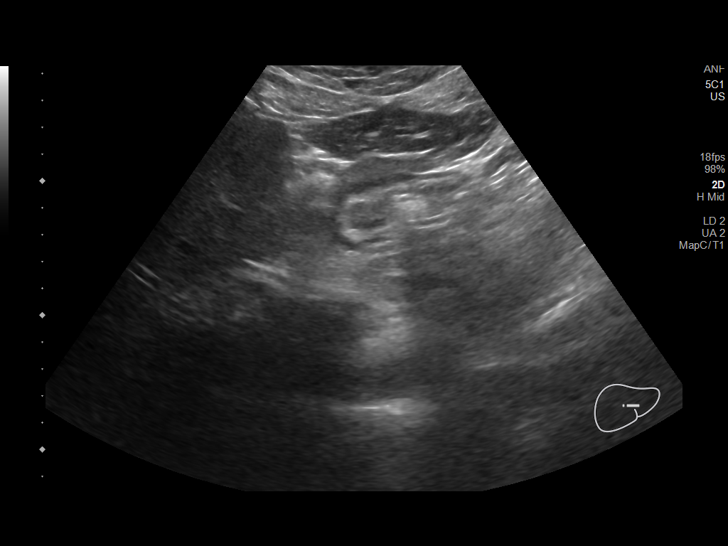
[im 39/59]
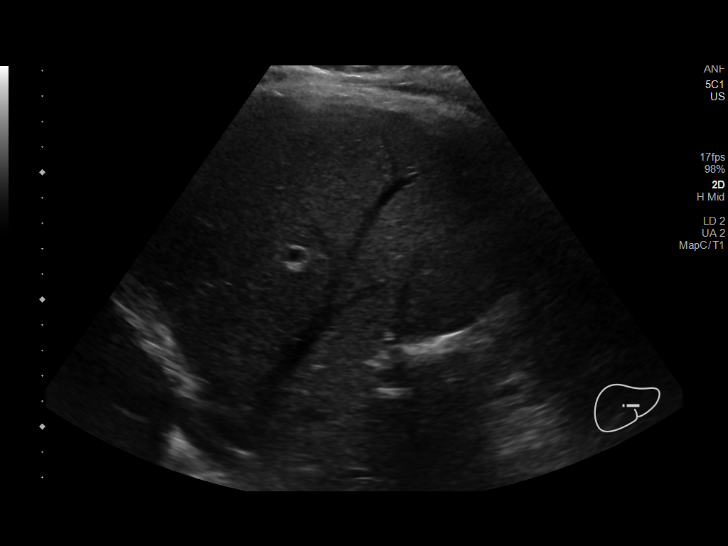
[im 44/59]
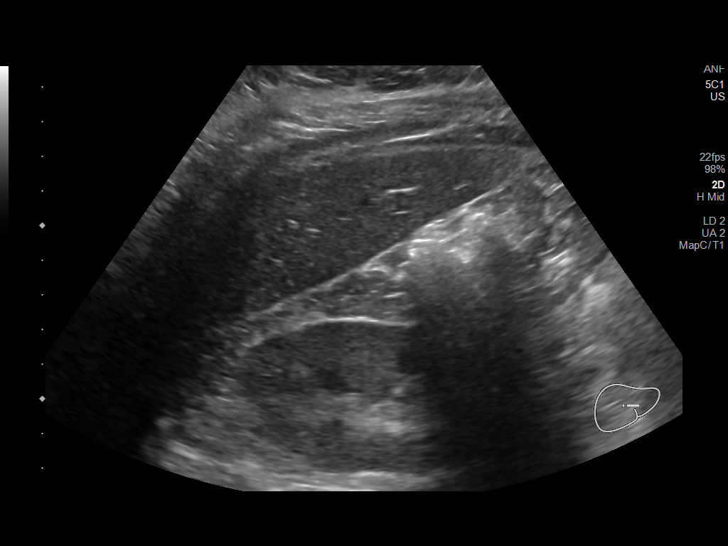
[im 49/59]
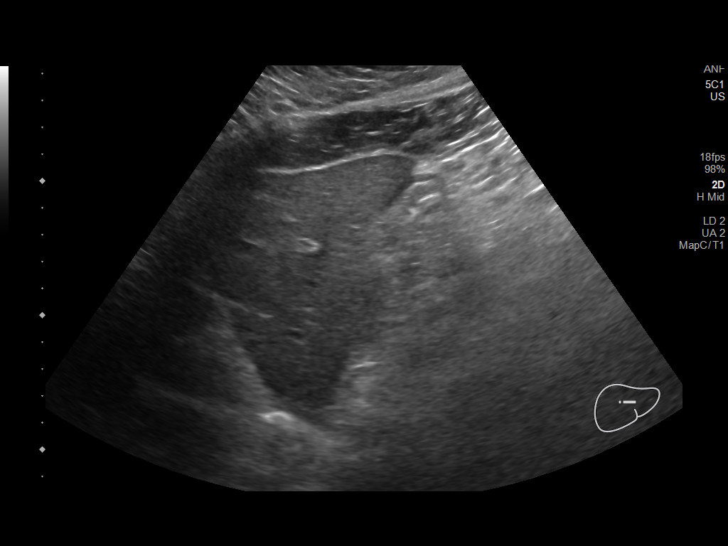
[im 54/59]
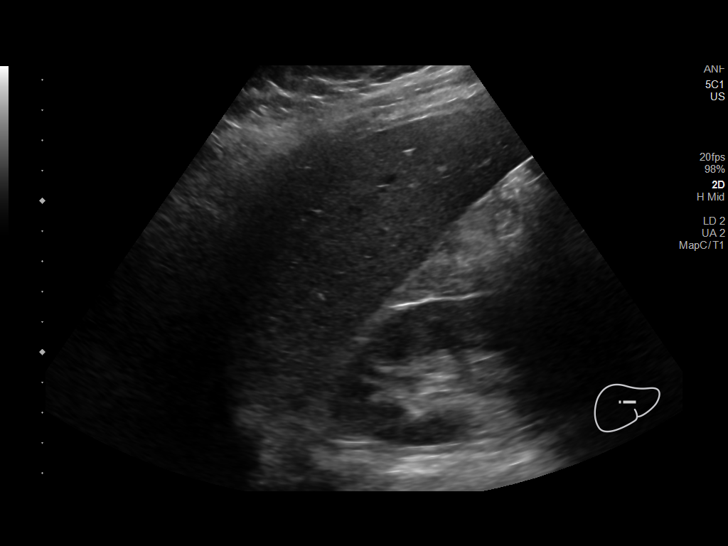
[im 59/59]
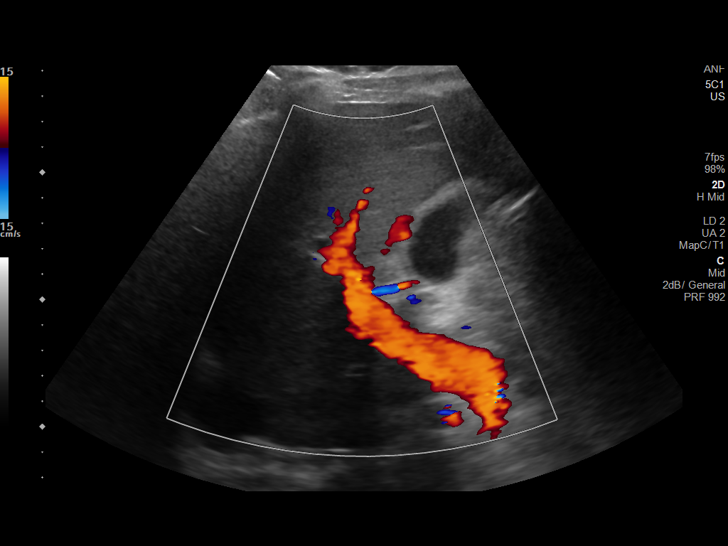

[14 of 25 positions shown; findings below may reference images not displayed]

FINDINGS: Gallbladder:

Gallstones are noted the gallbladder. No sonographic Murphy sign is
reported. No pericholecystic fluid is identified. The gallbladder
wall measures 3.8 mm.

Common bile duct:

Diameter: 3.2 mm

Liver:

No focal lesion identified. Within normal limits in parenchymal
echogenicity. Portal vein is patent on color Doppler imaging with
normal direction of blood flow towards the liver.

Other: None.
IMPRESSION: Cholelithiasis without sonographic evidence of acute cholecystitis.

## 2021-02-01 ENCOUNTER — Telehealth: Payer: Self-pay | Admitting: Physician Assistant

## 2021-02-01 DIAGNOSIS — B3731 Acute candidiasis of vulva and vagina: Secondary | ICD-10-CM

## 2021-02-01 MED ORDER — FLUCONAZOLE 150 MG PO TABS: 150.0000 mg | ORAL_TABLET | Freq: Once | ORAL | 0 refills | Status: AC

## 2021-02-01 NOTE — Progress Notes (Signed)
Message sent to patient requesting further input regarding current symptoms. Awaiting patient response.  

## 2021-02-01 NOTE — Progress Notes (Signed)

## 2021-02-01 NOTE — Progress Notes (Signed)
I have spent 5 minutes in review of e-visit questionnaire, review and updating patient chart, medical decision making and response to patient.   Ferlin Fairhurst Cody Careena Degraffenreid, PA-C    

## 2021-02-03 IMAGING — US US ABDOMEN LIMITED
1 series · 14 of 25 positions shown · non-contrast
Comparison: 01/03/2019

CLINICAL DATA: Right upper quadrant pain, history of gallstones

EXAM:
ULTRASOUND ABDOMEN LIMITED RIGHT UPPER QUADRANT

[Series 1: us abdomen limited · 14 of 56 slices shown]
[im 1/56]
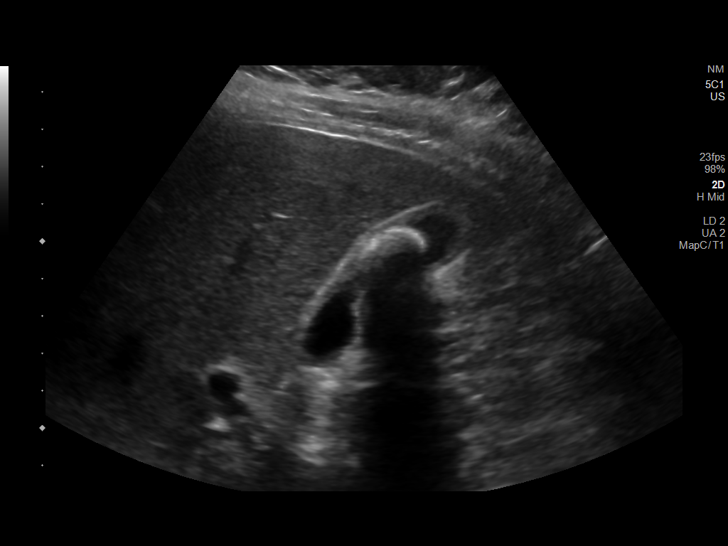
[im 5/56]
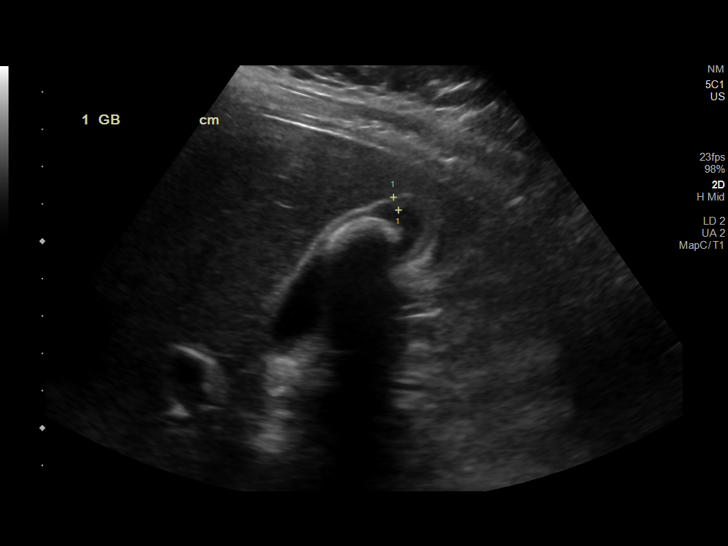
[im 10/56]
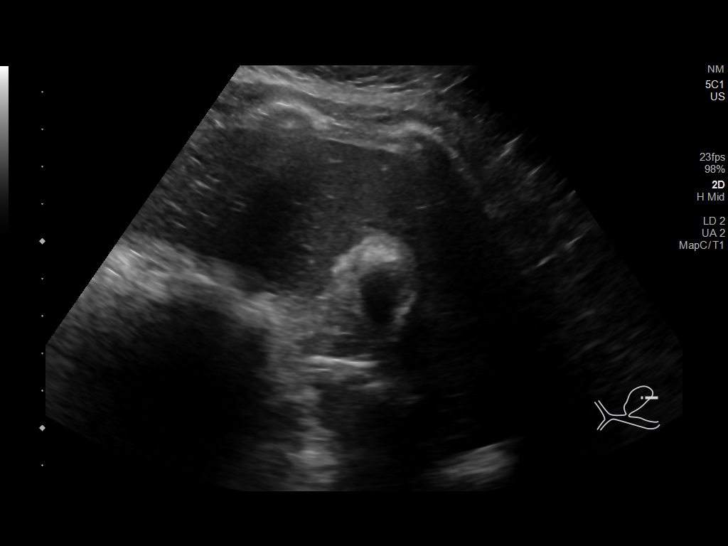
[im 14/56]
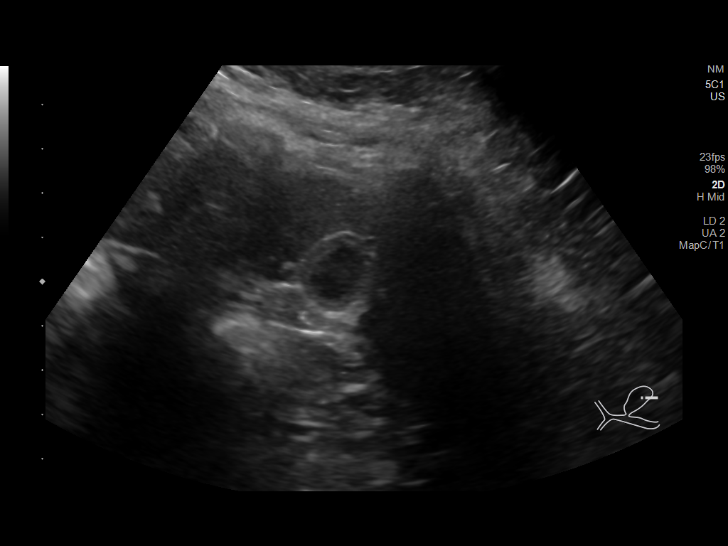
[im 19/56]
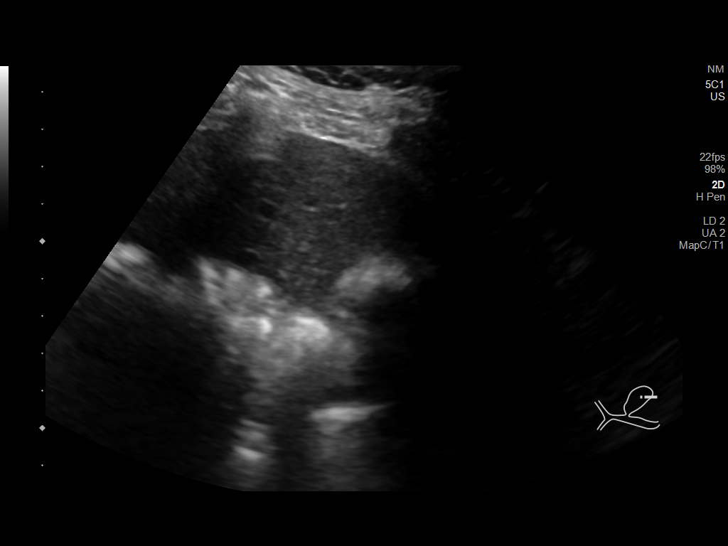
[im 21/56]
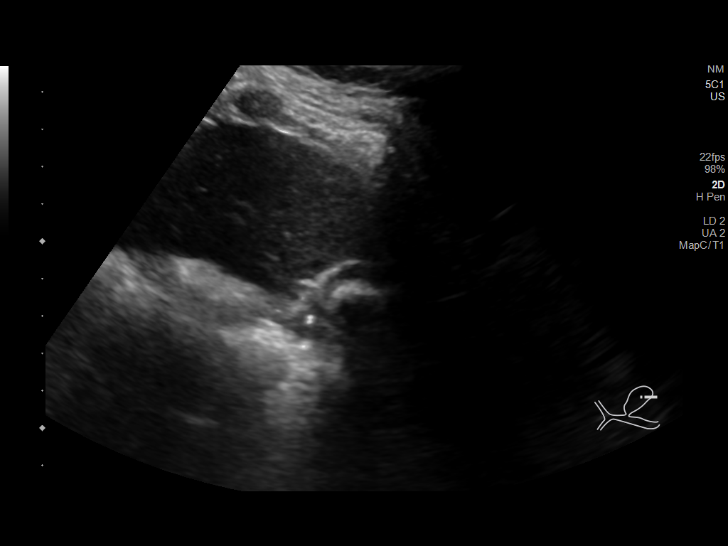
[im 26/56]
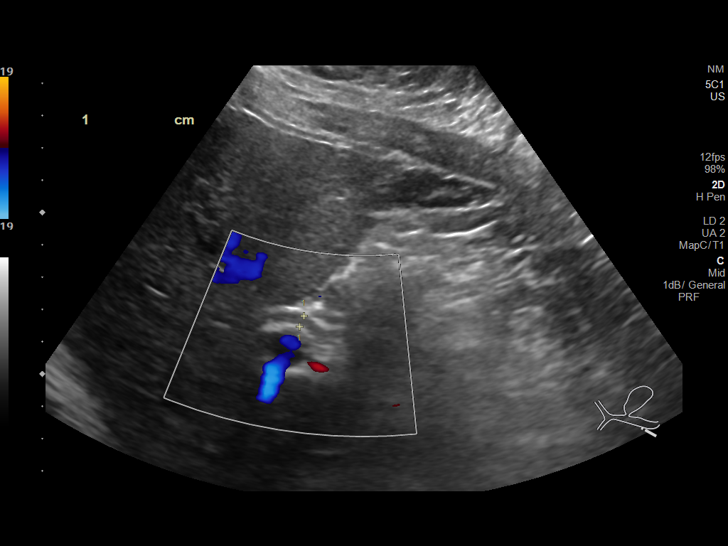
[im 30/56]
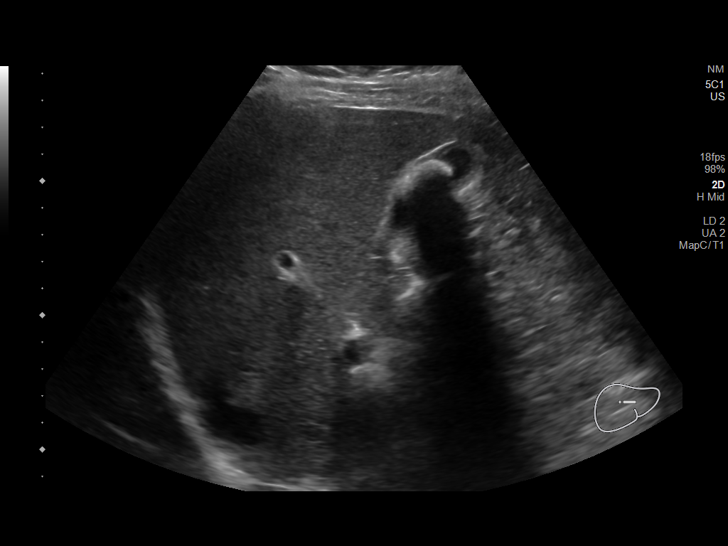
[im 35/56]
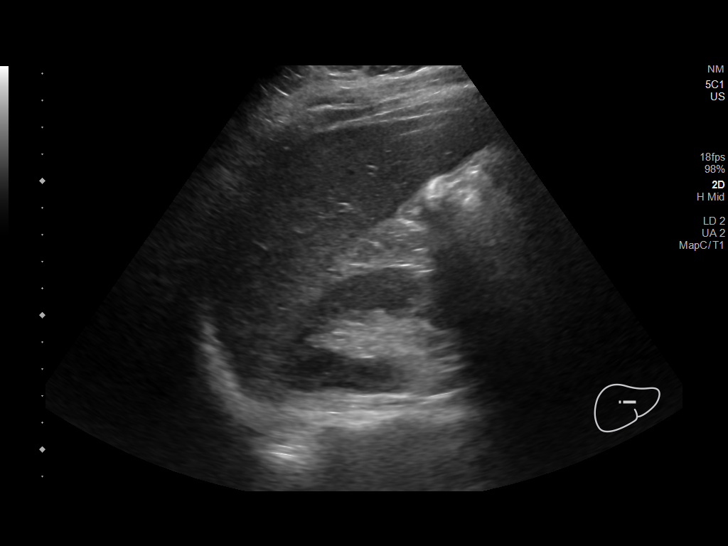
[im 37/56]
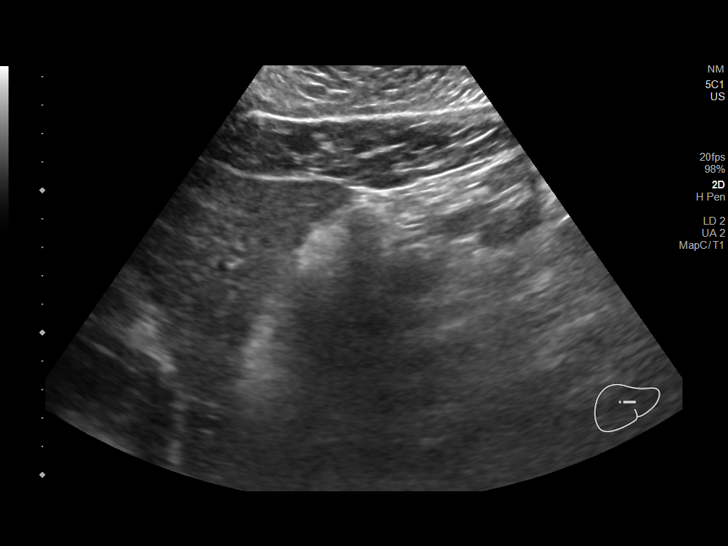
[im 42/56]
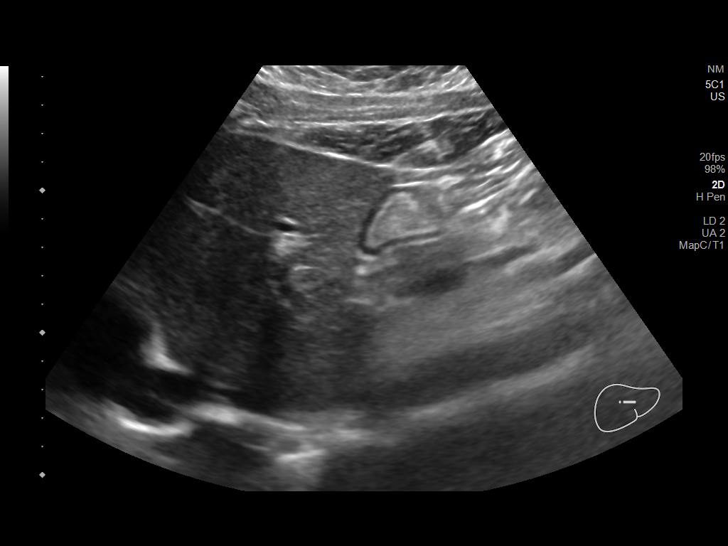
[im 46/56]
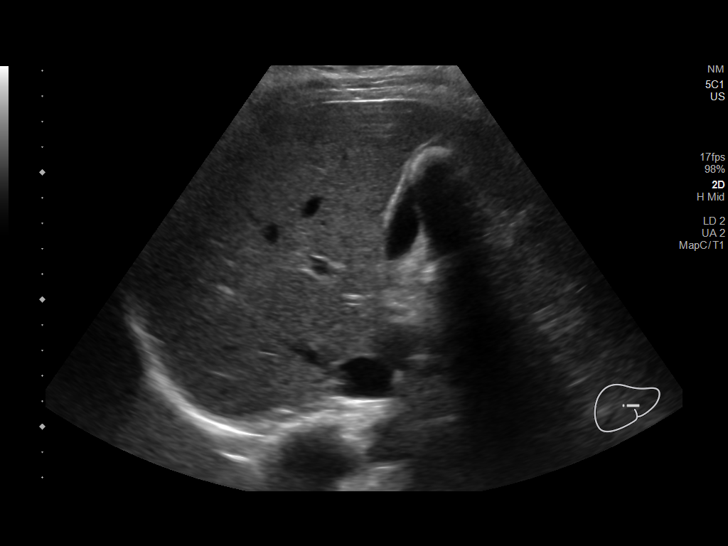
[im 51/56]
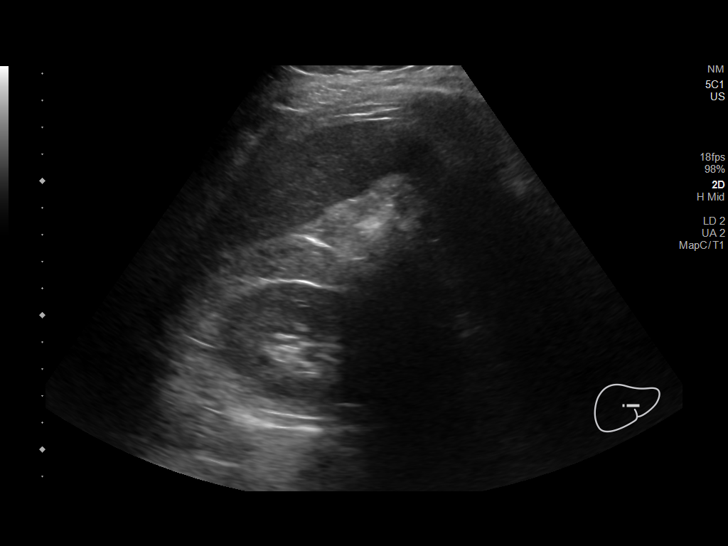
[im 56/56]
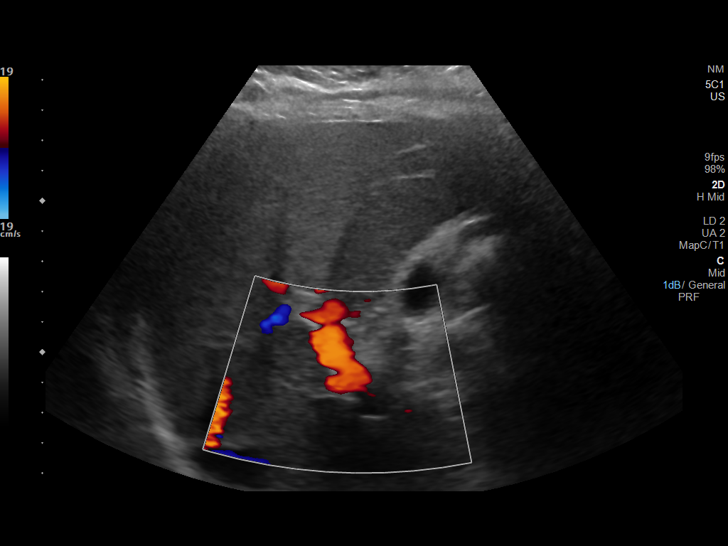

[14 of 25 positions shown; findings below may reference images not displayed]

FINDINGS: Gallbladder:

Large shadowing gallstone again identified measuring 2.4 cm. No
gallbladder wall thickening or sonographic evidence of
cholecystitis. Gallbladder is relatively decompressed.

Common bile duct:

Diameter: 4 mm

Liver:

No focal lesion identified. Within normal limits in parenchymal
echogenicity. Portal vein is patent on color Doppler imaging with
normal direction of blood flow towards the liver.

Other: None.
IMPRESSION: 1. Cholelithiasis without cholecystitis.

## 2021-02-28 IMAGING — US US ABDOMEN LIMITED
1 series · 14 of 25 positions shown · non-contrast
Comparison: 03/03/2019

CLINICAL DATA: Right upper quadrant pain

EXAM:
ULTRASOUND ABDOMEN LIMITED RIGHT UPPER QUADRANT

[Series 1: us abdomen limited · 14 of 54 slices shown]
[im 1/54]
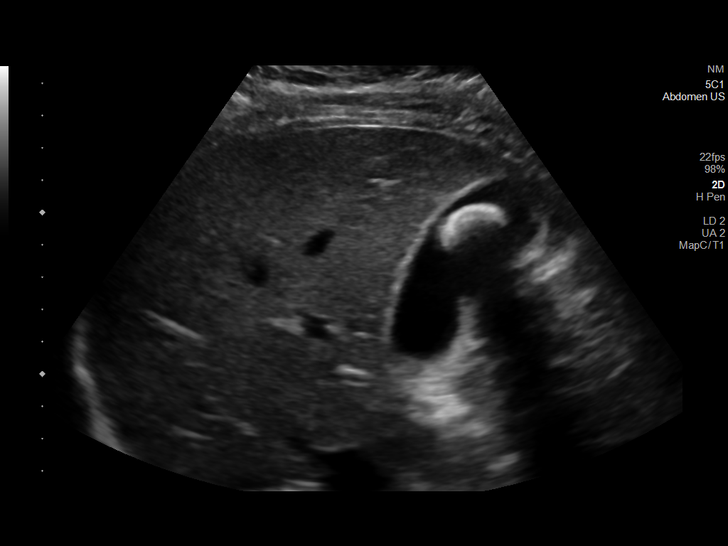
[im 5/54]
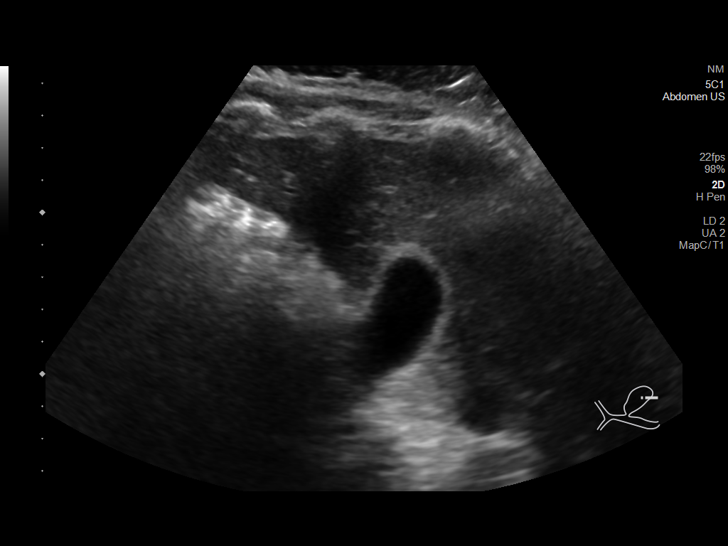
[im 9/54]
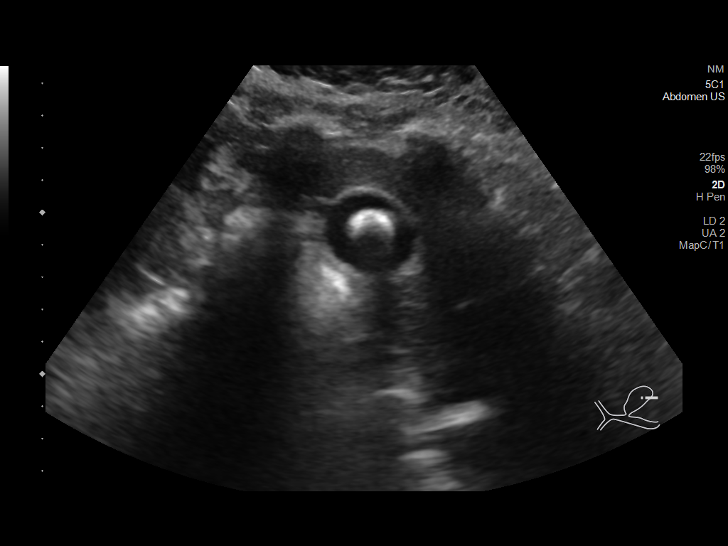
[im 14/54]
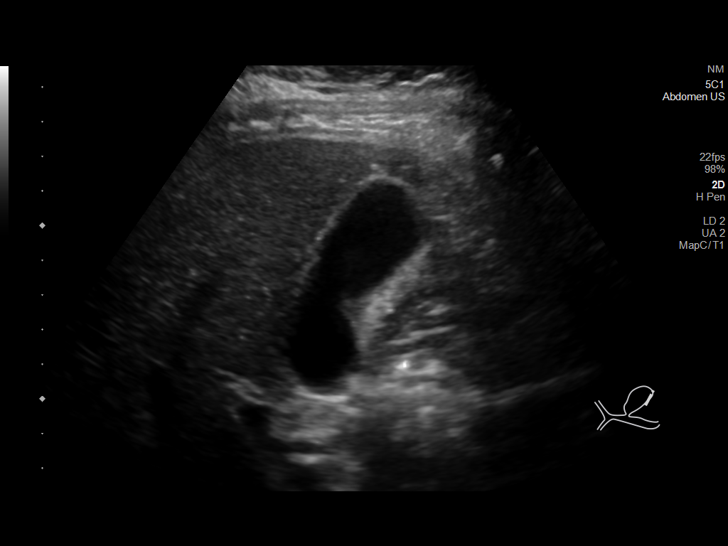
[im 18/54]
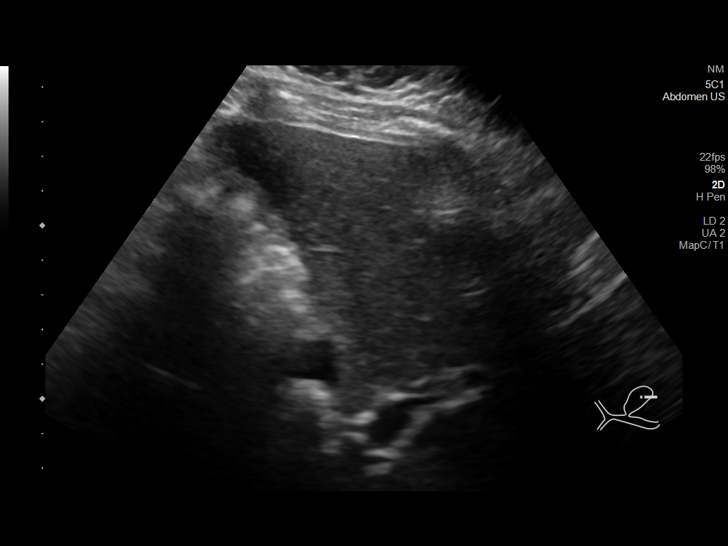
[im 20/54]
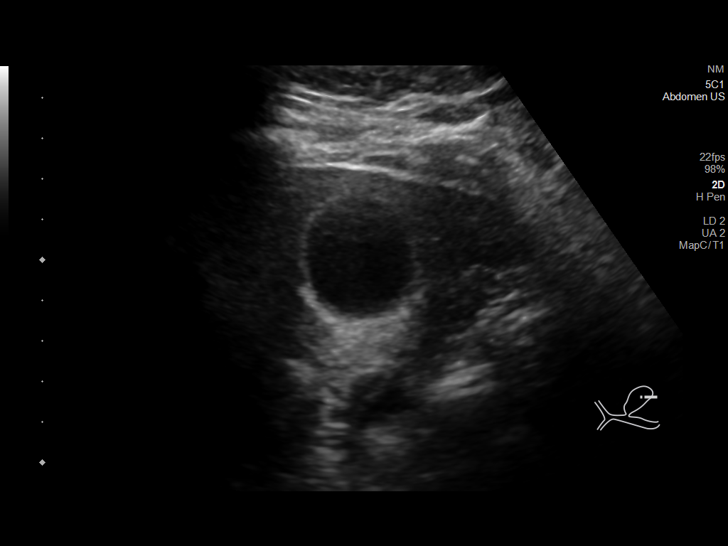
[im 25/54]
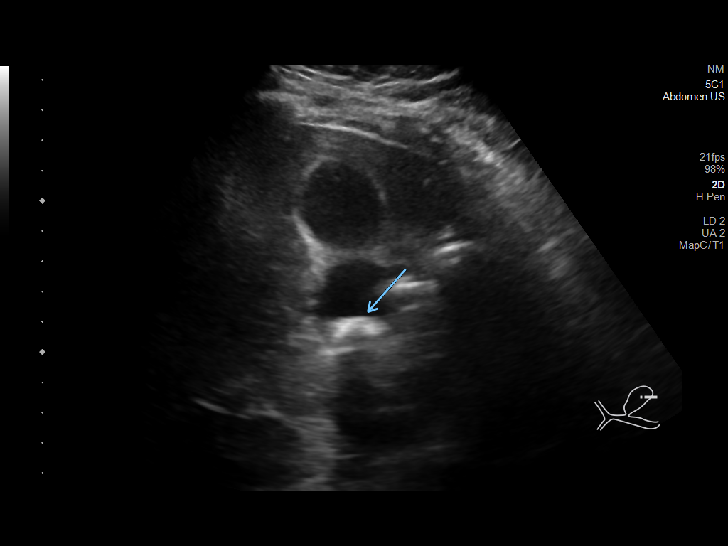
[im 29/54]
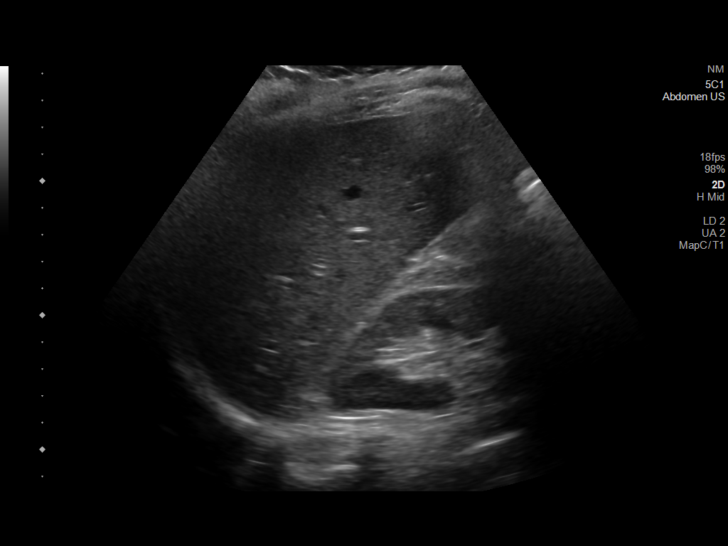
[im 34/54]
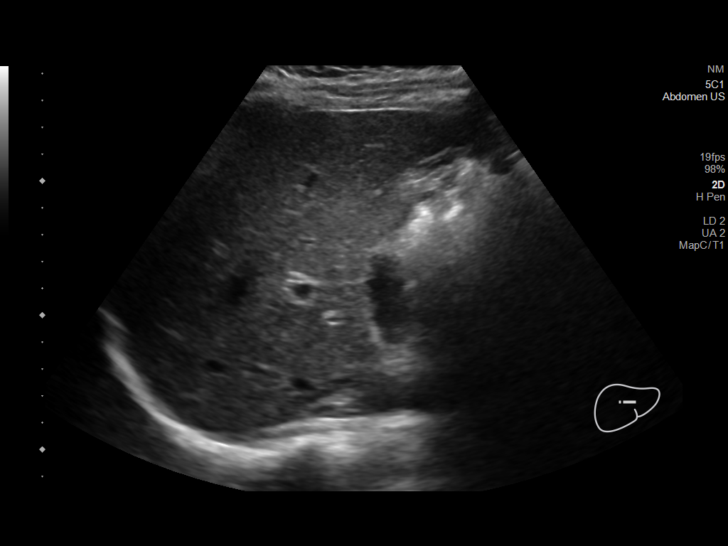
[im 36/54]
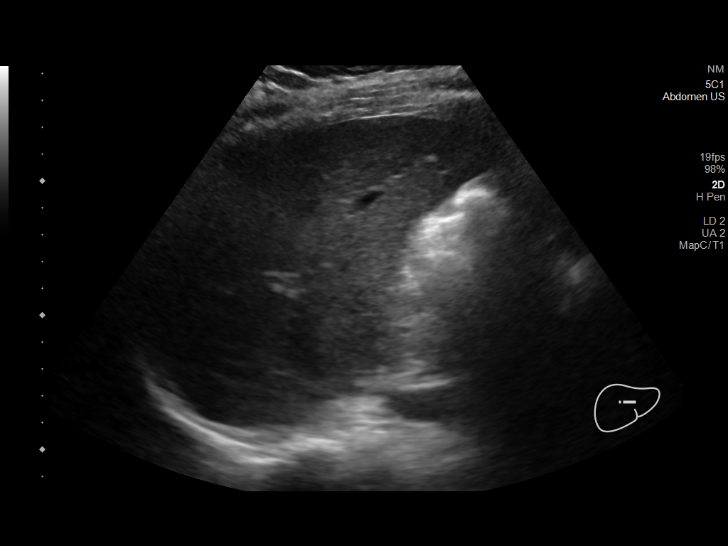
[im 40/54]
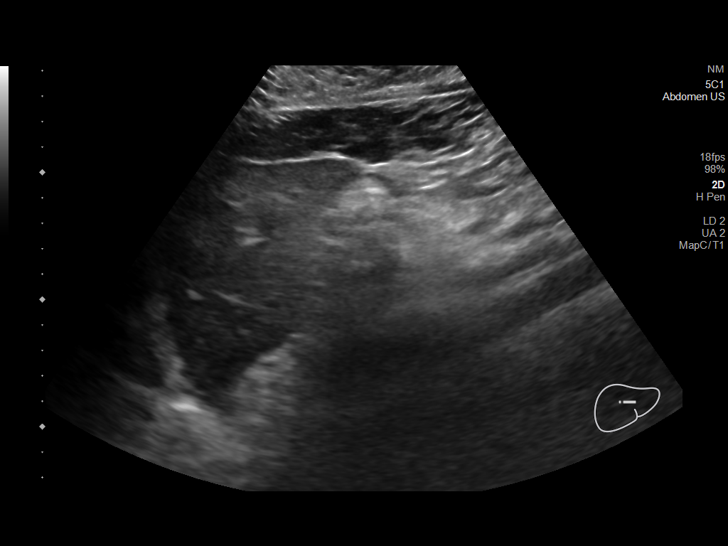
[im 45/54]
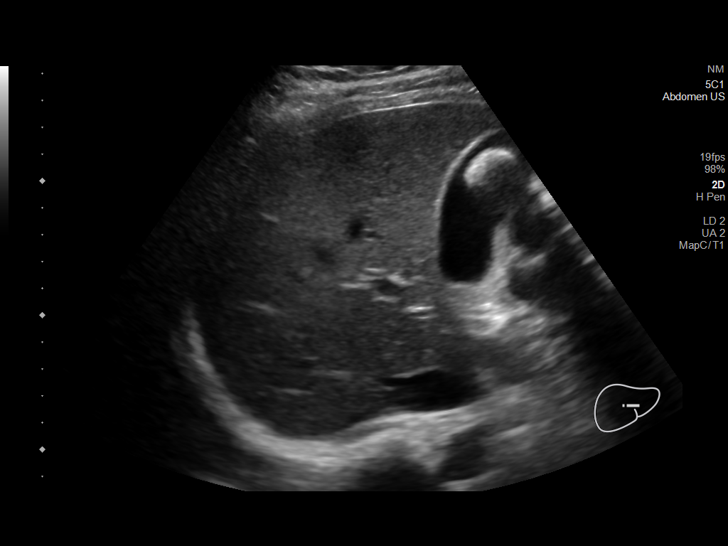
[im 49/54]
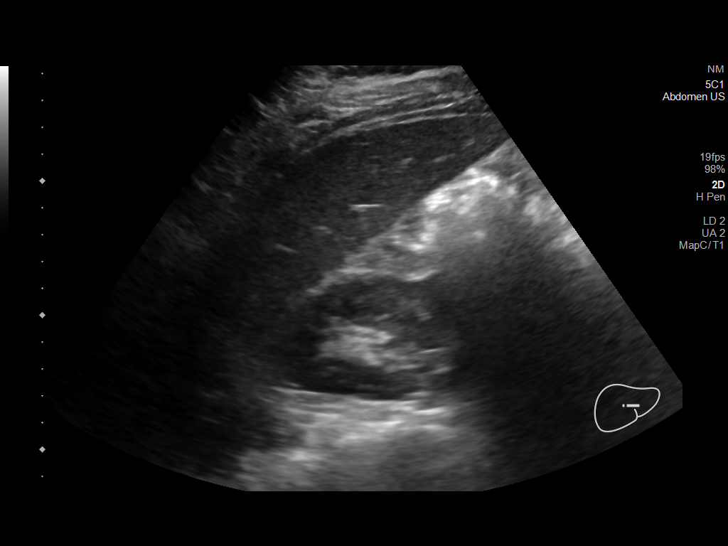
[im 54/54]
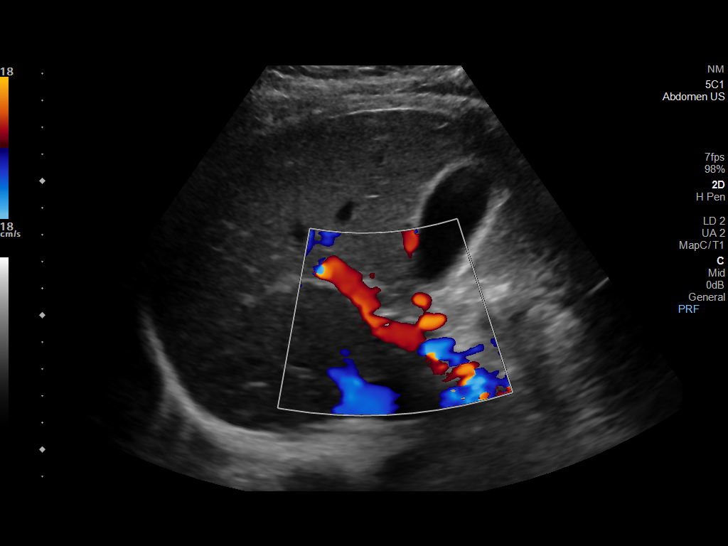

[14 of 25 positions shown; findings below may reference images not displayed]

FINDINGS: Gallbladder:

2.2 cm gallstone.  No wall thickening or sonographic Murphy sign.

Common bile duct:

Diameter: Normal caliber, 4 mm

Liver:

No focal lesion identified. Within normal limits in parenchymal
echogenicity. Portal vein is patent on color Doppler imaging with
normal direction of blood flow towards the liver.

Other: None.
IMPRESSION: Cholelithiasis, stable.  No evidence of acute cholecystitis.

## 2021-04-12 ENCOUNTER — Telehealth: Payer: Self-pay | Admitting: Family Medicine

## 2021-04-12 ENCOUNTER — Other Ambulatory Visit: Payer: Self-pay | Admitting: Family Medicine

## 2021-04-12 DIAGNOSIS — Z3040 Encounter for surveillance of contraceptives, unspecified: Secondary | ICD-10-CM

## 2021-04-12 NOTE — Telephone Encounter (Signed)
This has been refilled today.

## 2021-04-12 NOTE — Telephone Encounter (Signed)
Medication: drospirenone-ethinyl estradiol (YAZ) 3-0.02 MG tablet ? ?Has the patient contacted their pharmacy? No. ? ? ?Preferred Pharmacy: [facility] [place], [address]  ?[place], [place] [address]  ?Phone:  [phone]  Fax:  [phone]  ? ? ?

## 2021-05-26 ENCOUNTER — Telehealth: Payer: Self-pay | Admitting: Family Medicine

## 2021-05-26 ENCOUNTER — Encounter: Payer: Self-pay | Admitting: Family Medicine

## 2021-05-26 ENCOUNTER — Ambulatory Visit (INDEPENDENT_AMBULATORY_CARE_PROVIDER_SITE_OTHER): Payer: 59 | Admitting: Family Medicine

## 2021-05-26 VITALS — BP 119/68 | HR 62 | Temp 98.2°F | Ht 70.0 in | Wt 229.5 lb

## 2021-05-26 DIAGNOSIS — F321 Major depressive disorder, single episode, moderate: Secondary | ICD-10-CM

## 2021-05-26 DIAGNOSIS — M797 Fibromyalgia: Secondary | ICD-10-CM | POA: Diagnosis not present

## 2021-05-26 DIAGNOSIS — J4530 Mild persistent asthma, uncomplicated: Secondary | ICD-10-CM | POA: Diagnosis not present

## 2021-05-26 DIAGNOSIS — Z3040 Encounter for surveillance of contraceptives, unspecified: Secondary | ICD-10-CM | POA: Diagnosis not present

## 2021-05-26 MED ORDER — DROSPIRENONE-ETHINYL ESTRADIOL 3-0.02 MG PO TABS: 1.0000 | ORAL_TABLET | Freq: Every day | ORAL | 3 refills | Status: DC

## 2021-05-26 MED ORDER — AMITRIPTYLINE HCL 25 MG PO TABS
25.0000 mg | ORAL_TABLET | Freq: Every day | ORAL | 2 refills | Status: DC
Start: 2021-05-26 — End: 2021-10-13

## 2021-05-26 MED ORDER — PREDNISONE 20 MG PO TABS: 40.0000 mg | ORAL_TABLET | Freq: Every day | ORAL | 0 refills | Status: AC

## 2021-05-26 MED ORDER — FLUTICASONE PROPIONATE HFA 110 MCG/ACT IN AERO: 2.0000 | INHALATION_SPRAY | Freq: Two times a day (BID) | RESPIRATORY_TRACT | 1 refills | Status: AC

## 2021-05-26 NOTE — Telephone Encounter (Signed)
Patient states that even with insurance inhaler below is still $100 Can we send something else in for her?    fluticasone (FLOVENT HFA) 110 MCG/ACT inhaler [[id number]]

## 2021-05-26 NOTE — Telephone Encounter (Signed)
Patient states she spoke to her insurance and they told her there's no alternatives and that all of the medications are going to be that expensive.

## 2021-05-26 NOTE — Progress Notes (Signed)
Chief Complaint  Patient presents with   Follow-up   Cough    For one month     Subjective Carol Marquez presents for f/u anxiety/depression.  Pt is currently being treated with Elavil 10 mg/d.  Reports some worsening over the past 2 weeks as things are being aired out  No thoughts of harming self or others. No self-medication with alcohol, prescription drugs or illicit drugs. Pt is following with a counselor/psychologist.  FM Patient has a history of fibromyalgia.  She is doing okay with Elavil 10 mg daily.  She exercises routinely and lifts weights.  She is not pain-free but is functional and her pain does not limit her.  She takes gabapentin 300 mg at night which also helps.  It does make her drowsy so she has difficulty taking it during the day.  Asthma Patient has a history of mild persistent asthma for which she takes singular 10 mg daily for.  She uses albuterol as needed.  Over the past month, she had an upper respiratory infection which overall resolved but she still tight in the chest and coughing.  Her albuterol inhaler is not as helpful for this.  She denies any fevers, nausea/vomiting, diarrhea, chest pain, or other upper respiratory symptoms.  Past Medical History:  Diagnosis Date   Asthma    Diagnosed age 5 or 31 yo.   Calculus of gallbladder    Endometriosis    Fibromyalgia    Allergies as of 05/26/2021       Reactions   Shellfish Allergy Anaphylaxis, Hives   Bee Venom Swelling   Codeine Nausea Only        Medication List        Accurate as of May 26, 2021  9:44 AM. If you have any questions, ask your nurse or doctor.          albuterol 108 (90 Base) MCG/ACT inhaler Commonly known as: VENTOLIN HFA INHALE 2 PUFFS INTO THE LUNGS EVERY 4 HOURS AS NEEDED FOR WHEEZING OR SHORTNESS OF BREATH   amitriptyline 25 MG tablet Commonly known as: ELAVIL Take 1 tablet (25 mg total) by mouth at bedtime. What changed:  medication strength how much to  take how to take this when to take this additional instructions Changed by: Sharlene Dory, DO   azelastine 0.1 % nasal spray Commonly known as: ASTELIN Place 2 sprays into both nostrils 2 (two) times daily. Use in each nostril as directed   CRANBERRY CONCENTRATE/VITAMINC PO Take 2 capsules by mouth daily.   diphenhydrAMINE 25 MG tablet Commonly known as: BENADRYL Take 50 mg by mouth daily as needed for itching or allergies.   drospirenone-ethinyl estradiol 3-0.02 MG tablet Commonly known as: YAZ Take 1 tablet by mouth at bedtime.   fexofenadine 180 MG tablet Commonly known as: ALLEGRA Take 1 tablet (180 mg total) by mouth daily.   fluticasone 110 MCG/ACT inhaler Commonly known as: Flovent HFA Inhale 2 puffs into the lungs in the morning and at bedtime. Rinse mouth out after use. Started by: Sharlene Dory, DO   fluticasone 50 MCG/ACT nasal spray Commonly known as: FLONASE Place 2 sprays into both nostrils daily.   gabapentin 300 MG capsule Commonly known as: Neurontin Take 1 capsule (300 mg total) by mouth 3 (three) times daily.   Hair Skin and Nails Formula Tabs Take 1 tablet by mouth daily.   ibuprofen 200 MG tablet Commonly known as: ADVIL Take 400-800 mg by mouth every 8 (eight) hours  as needed for moderate pain.   montelukast 10 MG tablet Commonly known as: SINGULAIR TAKE 1 TABLET(10 MG) BY MOUTH AT BEDTIME        Exam BP 119/68   Pulse 62   Temp 98.2 F (36.8 C) (Oral)   Ht 5\' 10"  (1.778 m)   Wt 229 lb 8 oz (104.1 kg)   SpO2 99%   BMI 32.93 kg/m  General:  well developed, well nourished, in no apparent distress Heart: RRR, no LE edema HEENT: Ears are patent without erythema or discharge, TMs negative, nares are patent without rhinorrhea, PERRLA, MMM, no pharyngeal exudate or erythema Lungs:  CTAB. No respiratory distress Psych: well oriented with normal range of affect and age-appropriate judgement/insight, alert and oriented  x4.  Assessment and Plan  Moderate major depression (HCC) - Plan: amitriptyline (ELAVIL) 25 MG tablet  Fibromyalgia - Plan: amitriptyline (ELAVIL) 25 MG tablet  Encounter for surveillance of contraceptives, unspecified contraceptive - Plan: drospirenone-ethinyl estradiol (YAZ) 3-0.02 MG tablet  Mild persistent asthma without complication - Plan: fluticasone (FLOVENT HFA) 110 MCG/ACT inhaler  Chronic, unstable.  Increase Elavil from 10 mg daily to 25 mg daily.  Counseled on exercise.  Continue following with counseling team. Continue Elavil and routine physical activity. Refill OCP. Currently having a mild flare due to upper respiratory infection.  Add inhaled corticosteroid twice daily for the next month.  Probably will not need much further than that. F/u in 1 month recheck #1 and 4. The patient voiced understanding and agreement to the plan.  01-26-1996 [place], DO 05/26/21 9:44 AM

## 2021-05-26 NOTE — Addendum Note (Signed)
Addended by: Radene Gunning on: 05/26/2021 12:52 PM   Modules accepted: Orders

## 2021-05-26 NOTE — Telephone Encounter (Signed)
Patient informed and she will take care of.

## 2021-05-26 NOTE — Patient Instructions (Signed)
Stay active.  Let me know if there are cost issues with the inhaler.  Let us know if you need anything.

## 2021-06-28 ENCOUNTER — Ambulatory Visit: Payer: 59 | Admitting: Family Medicine

## 2021-07-12 ENCOUNTER — Ambulatory Visit (INDEPENDENT_AMBULATORY_CARE_PROVIDER_SITE_OTHER): Payer: 59 | Admitting: Family Medicine

## 2021-07-12 ENCOUNTER — Encounter: Payer: Self-pay | Admitting: Family Medicine

## 2021-07-12 VITALS — BP 121/82 | HR 81 | Temp 99.0°F | Ht 70.0 in | Wt 234.1 lb

## 2021-07-12 DIAGNOSIS — J4530 Mild persistent asthma, uncomplicated: Secondary | ICD-10-CM

## 2021-07-12 DIAGNOSIS — M545 Low back pain, unspecified: Secondary | ICD-10-CM

## 2021-07-12 DIAGNOSIS — M797 Fibromyalgia: Secondary | ICD-10-CM | POA: Diagnosis not present

## 2021-07-12 DIAGNOSIS — F321 Major depressive disorder, single episode, moderate: Secondary | ICD-10-CM

## 2021-07-12 MED ORDER — MELOXICAM 15 MG PO TABS: 15.0000 mg | ORAL_TABLET | Freq: Every day | ORAL | 0 refills | Status: DC

## 2021-07-12 NOTE — Progress Notes (Signed)
Chief Complaint  Patient presents with   Follow-up    Back pain Medication follow-upon bronchitis      Subjective Carol Marquez presents for f/u depression, FM and asthma.  Pt is currently being treated with Elavil 25 mg/d after being increased from 10 mg/d.  Reports doing much better since treatment. FM pain had improved as well.  No thoughts of harming self or others. No self-medication with alcohol, prescription drugs or illicit drugs. Pt is following with a counselor/psychologist.  Asthma- ICS helped quite a bit even with the poorer air quality. Rarely uses SABA. Content currently.   3 d ago, tried to keep a large bite dog away from a smaller dog. She has RL back pain radiating down her R thigh since then. Has tried Flexeril at home w/o relief. Heat does help. No neuro s/s's. No bowel/bladder incontinence, bruising, swelling, redness.   Past Medical History:  Diagnosis Date   Asthma    Diagnosed age 31 or 31 yo.   Calculus of gallbladder    Endometriosis    Fibromyalgia    Allergies as of 07/12/2021       Reactions   Shellfish Allergy Anaphylaxis, Hives   Bee Venom Swelling   Codeine Nausea Only        Medication List        Accurate as of July 12, 2021  2:41 PM. If you have any questions, ask your nurse or doctor.          albuterol 108 (90 Base) MCG/ACT inhaler Commonly known as: VENTOLIN HFA INHALE 2 PUFFS INTO THE LUNGS EVERY 4 HOURS AS NEEDED FOR WHEEZING OR SHORTNESS OF BREATH   amitriptyline 25 MG tablet Commonly known as: ELAVIL Take 1 tablet (25 mg total) by mouth at bedtime.   azelastine 0.1 % nasal spray Commonly known as: ASTELIN Place 2 sprays into both nostrils 2 (two) times daily. Use in each nostril as directed   CRANBERRY CONCENTRATE/VITAMINC PO Take 2 capsules by mouth daily.   diphenhydrAMINE 25 MG tablet Commonly known as: BENADRYL Take 50 mg by mouth daily as needed for itching or allergies.   drospirenone-ethinyl  estradiol 3-0.02 MG tablet Commonly known as: YAZ Take 1 tablet by mouth at bedtime.   fexofenadine 180 MG tablet Commonly known as: ALLEGRA Take 1 tablet (180 mg total) by mouth daily.   fluticasone 110 MCG/ACT inhaler Commonly known as: Flovent HFA Inhale 2 puffs into the lungs in the morning and at bedtime. Rinse mouth out after use.   fluticasone 50 MCG/ACT nasal spray Commonly known as: FLONASE Place 2 sprays into both nostrils daily.   gabapentin 300 MG capsule Commonly known as: Neurontin Take 1 capsule (300 mg total) by mouth 3 (three) times daily.   Hair Skin and Nails Formula Tabs Take 1 tablet by mouth daily.   ibuprofen 200 MG tablet Commonly known as: ADVIL Take 400-800 mg by mouth every 8 (eight) hours as needed for moderate pain.   meloxicam 15 MG tablet Commonly known as: MOBIC Take 1 tablet (15 mg total) by mouth daily. Started by: Jilda Roche Paislei Dorval, DO   montelukast 10 MG tablet Commonly known as: SINGULAIR TAKE 1 TABLET(10 MG) BY MOUTH AT BEDTIME        Exam BP 121/82   Pulse 81   Temp 99 F (37.2 C) (Oral)   Ht 5\' 10"  (1.778 m)   Wt 234 lb 2 oz (106.2 kg)   SpO2 99%   BMI 33.59 kg/m  General:  well developed, well nourished, in no apparent distress Lungs:  No respiratory distress Psych: well oriented with normal range of affect and age-appropriate judgement/insight, alert and oriented x4.  Assessment and Plan  Moderate major depression (HCC)  Fibromyalgia  Mild persistent asthma without complication  Acute right-sided low back pain without sciatica - Plan: meloxicam (MOBIC) 15 MG tablet  Chronic, stable. Cont Elavil 25 mg/d.  Chronic, stable. Cont Elavil 25 mg/d.  Chronic, stable. Cont ICS prn.  Acute, self-resolving. Mobic prn. Stretches/exercises, heat, ice, Tylenol.  F/u in 5 mo for CPE. The patient voiced understanding and agreement to the plan.  Jilda Roche [place], DO 07/12/21 2:41 PM

## 2021-07-12 NOTE — Patient Instructions (Signed)

## 2021-08-14 ENCOUNTER — Other Ambulatory Visit: Payer: Self-pay | Admitting: Family Medicine

## 2021-08-14 DIAGNOSIS — M545 Low back pain, unspecified: Secondary | ICD-10-CM

## 2021-10-13 ENCOUNTER — Other Ambulatory Visit: Payer: Self-pay | Admitting: Family Medicine

## 2021-10-13 DIAGNOSIS — M797 Fibromyalgia: Secondary | ICD-10-CM

## 2021-10-13 DIAGNOSIS — F321 Major depressive disorder, single episode, moderate: Secondary | ICD-10-CM

## 2021-10-13 DIAGNOSIS — J4530 Mild persistent asthma, uncomplicated: Secondary | ICD-10-CM

## 2021-10-13 MED ORDER — AMITRIPTYLINE HCL 25 MG PO TABS: 25.0000 mg | ORAL_TABLET | Freq: Every day | ORAL | 2 refills | Status: DC

## 2021-10-13 MED ORDER — MONTELUKAST SODIUM 10 MG PO TABS: ORAL_TABLET | ORAL | 2 refills | Status: DC

## 2021-11-26 ENCOUNTER — Telehealth: Payer: Self-pay | Admitting: Family

## 2021-11-26 DIAGNOSIS — H6690 Otitis media, unspecified, unspecified ear: Secondary | ICD-10-CM

## 2021-11-26 MED ORDER — AMOXICILLIN 875 MG PO TABS: 875.0000 mg | ORAL_TABLET | Freq: Two times a day (BID) | ORAL | 0 refills | Status: AC

## 2021-11-26 NOTE — Progress Notes (Signed)
E Visit for Swimmer's Ear  We are sorry that you are not feeling well. Here is how we plan to help!  From your symptoms its indicate an ear infection. I have sent in Amoxicillin 875 mg twice a day for 7 days.    Your symptoms should improve over the next 3 days and should resolve in about 7 days.  HOME CARE:  Wash your hands frequently. Do not place the tip of the bottle on your ear or touch it with your fingers. You can take Acetominophen 650 mg every 4-6 hours as needed for pain.  If pain is severe or moderate, you can apply a heating pad (set on low) or hot water bottle (wrapped in a towel) to outer ear for 20 minutes.  This will also increase drainage. Avoid ear plugs Do not use Q-tips After showers, help the water run out by tilting your head to one side.  GET HELP RIGHT AWAY IF:  Fever is over 102.2 degrees. You develop progressive ear pain or hearing loss. Ear symptoms persist longer than 3 days after treatment.  MAKE SURE YOU:  Understand these instructions. Will watch your condition. Will get help right away if you are not doing well or get worse.  TO PREVENT SWIMMER'S EAR: Use a bathing cap or custom fitted swim molds to keep your ears dry. Towel off after swimming to dry your ears. Tilt your head or pull your earlobes to allow the water to escape your ear canal. If there is still water in your ears, consider using a hairdryer on the lowest setting.   Thank you for choosing an e-visit.  Your e-visit answers were reviewed by a board certified advanced clinical practitioner to complete your personal care plan. Depending upon the condition, your plan could have included both over the counter or prescription medications.  Please review your pharmacy choice. Make sure the pharmacy is open so you can pick up prescription now. If there is a problem, you may contact your provider through [employer] and have the prescription routed to another pharmacy.  Your safety is  important to [place]. If you have drug allergies check your prescription carefully.   For the next 24 hours you can use MyChart to ask questions about today's visit, request a non-urgent call back, or ask for a work or school excuse. You will get an email in the next two days asking about your experience. I hope that your e-visit has been valuable and will speed your recovery.  Approximately 5 minutes was spent documenting and reviewing patient's chart.

## 2021-12-12 ENCOUNTER — Encounter: Payer: Self-pay | Admitting: Family Medicine

## 2021-12-20 ENCOUNTER — Ambulatory Visit (INDEPENDENT_AMBULATORY_CARE_PROVIDER_SITE_OTHER): Payer: Commercial Managed Care - HMO | Admitting: Family Medicine

## 2021-12-20 ENCOUNTER — Encounter: Payer: Self-pay | Admitting: Family Medicine

## 2021-12-20 VITALS — BP 128/84 | HR 86 | Temp 98.9°F | Ht 70.0 in | Wt 224.5 lb

## 2021-12-20 DIAGNOSIS — Z0000 Encounter for general adult medical examination without abnormal findings: Secondary | ICD-10-CM | POA: Diagnosis not present

## 2021-12-20 NOTE — Progress Notes (Signed)
Chief Complaint  Patient presents with   Annual Exam   Nausea     Well Woman Carol Marquez is here for a complete physical.   Her last physical was >1 year ago.  Current diet: in general, diet is OK. Current exercise: active at work. Fatigue out of ordinary? No Seatbelt? Yes Advanced directive? No  Health Maintenance Pap/HPV- No Tetanus- Yes HIV screening- Yes Hep C screening- Yes  Past Medical History:  Diagnosis Date   Asthma    Diagnosed age 31 or 31 yo.   Calculus of gallbladder    Endometriosis    Fibromyalgia     Past Surgical History:  Procedure Laterality Date   CHOLECYSTECTOMY N/A 09/01/2019   Procedure: LAPAROSCOPIC CHOLECYSTECTOMY;  Surgeon: Harriette Bouillon, MD;  Location: [facility] OR;  Service: General;  Laterality: N/A;   WISDOM TOOTH EXTRACTION  2011    Medications  Current Outpatient Medications on File Prior to Visit  Medication Sig Dispense Refill   albuterol (VENTOLIN HFA) 108 (90 Base) MCG/ACT inhaler INHALE 2 PUFFS INTO THE LUNGS EVERY 4 HOURS AS NEEDED FOR WHEEZING OR SHORTNESS OF BREATH 90 g 0   amitriptyline (ELAVIL) 25 MG tablet Take 1 tablet (25 mg total) by mouth at bedtime. 90 tablet 2   azelastine (ASTELIN) 0.1 % nasal spray Place 2 sprays into both nostrils 2 (two) times daily. Use in each nostril as directed 30 mL 12   Cranberry-Vitamin C (CRANBERRY CONCENTRATE/VITAMINC PO) Take 2 capsules by mouth daily.     diphenhydrAMINE (BENADRYL) 25 MG tablet Take 50 mg by mouth daily as needed for itching or allergies.     drospirenone-ethinyl estradiol (YAZ) 3-0.02 MG tablet Take 1 tablet by mouth at bedtime. 84 tablet 3   fexofenadine (ALLEGRA) 180 MG tablet Take 1 tablet (180 mg total) by mouth daily.     fluticasone (FLONASE) 50 MCG/ACT nasal spray Place 2 sprays into both nostrils daily. 16 g 2   fluticasone (FLOVENT HFA) 110 MCG/ACT inhaler Inhale 2 puffs into the lungs in the morning and at bedtime. Rinse mouth out after use. 1 each 1    gabapentin (NEURONTIN) 300 MG capsule Take 1 capsule (300 mg total) by mouth 3 (three) times daily. 90 capsule 10   ibuprofen (ADVIL) 200 MG tablet Take 400-800 mg by mouth every 8 (eight) hours as needed for moderate pain.      meloxicam (MOBIC) 15 MG tablet TAKE 1 TABLET(15 MG) BY MOUTH DAILY 30 tablet 0   montelukast (SINGULAIR) 10 MG tablet TAKE 1 TABLET(10 MG) BY MOUTH AT BEDTIME 90 tablet 2   Multiple Vitamins-Minerals (HAIR SKIN AND NAILS FORMULA) TABS Take 1 tablet by mouth daily.     [DISCONTINUED] metoCLOPramide (REGLAN) 10 MG tablet Take 1 tablet (10 mg total) by mouth every 8 (eight) hours as needed for nausea. 15 tablet 0   Allergies Allergies  Allergen Reactions   Shellfish Allergy Anaphylaxis and Hives   Bee Venom Swelling   Codeine Nausea Only    Review of Systems: Constitutional:  no unexpected weight changes Eye:  no recent significant change in vision Ear/Nose/Mouth/Throat:  Ears:  no tinnitus or vertigo and no recent change in hearing Nose/Mouth/Throat:  no complaints of nasal congestion, no sore throat Cardiovascular: no chest pain Respiratory:  no cough and no shortness of breath Gastrointestinal:  no abdominal pain, no change in bowel habits GU:  Female: negative for dysuria or pelvic pain Musculoskeletal/Extremities:  no pain of the joints Integumentary (Skin/Breast):  no  abnormal skin lesions reported Neurologic:  no headaches Endocrine:  denies fatigue Hematologic/Lymphatic:  No areas of easy bleeding  Exam BP 128/84 (BP Location: Left Arm, Patient Position: Sitting, Cuff Size: Normal)   Pulse 86   Temp 98.9 F (37.2 C) (Oral)   Ht 5\' 10"  (1.778 m)   Wt 224 lb 8 oz (101.8 kg)   SpO2 98%   BMI 32.21 kg/m  General:  well developed, well nourished, in no apparent distress Skin:  no significant moles, warts, or growths Head:  no masses, lesions, or tenderness Eyes:  pupils equal and round, sclera anicteric without injection Ears:  canals without  lesions, TMs shiny without retraction, no obvious effusion, no erythema Nose:  nares patent, mucosa normal, and no drainage  Throat/Pharynx:  lips and gingiva without lesion; tongue and uvula midline; non-inflamed pharynx; no exudates or postnasal drainage Neck: neck supple without adenopathy, thyromegaly, or masses Lungs:  clear to auscultation, breath sounds equal bilaterally, no respiratory distress Cardio:  regular rate and rhythm, no bruits, no LE edema Abdomen:  abdomen soft, nontender; bowel sounds normal; no masses or organomegaly Genital: Defer to GYN Musculoskeletal:  symmetrical muscle groups noted without atrophy or deformity Extremities:  no clubbing, cyanosis, or edema, no deformities, no skin discoloration Neuro:  gait normal; deep tendon reflexes normal and symmetric Psych: well oriented with normal range of affect and appropriate judgment/insight  Assessment and Plan  Well adult exam - Plan: CBC, Comprehensive metabolic panel, Lipid panel   Well 31 y.o. female. Counseled on diet and exercise. Advanced directive form provided today.  Needs to sched pap. GYN info provided.  Other orders as above. Follow up in 6 mo. The patient voiced understanding and agreement to the plan.  [address] [place], DO 12/20/21 2:01 PM

## 2021-12-20 NOTE — Patient Instructions (Signed)
Give us 2-3 business days to get the results of your labs back.   Keep the diet clean and stay active.  Please get me a copy of your advanced directive form at your convenience.   Call [facility] [place] at [phone] for an appointment.  They are located at [address], [address], [place], [place], [address] (right across the hall from our office).  Let us know if you need anything.  

## 2021-12-21 LAB — LIPID PANEL
Cholesterol: 182 mg/dL (ref 0–200)
HDL: 75.1 mg/dL (ref >39.00)
LDL Cholesterol: 85 mg/dL (ref 0–99)
NonHDL: 107.06
Total CHOL/HDL Ratio: 2
Triglycerides: 111 mg/dL (ref 0.0–149.0)
VLDL: 22.2 mg/dL (ref 0.0–40.0)

## 2021-12-21 LAB — CBC
HCT: 40.8 % (ref 36.0–46.0)
Hemoglobin: 13.9 g/dL (ref 12.0–15.0)
MCHC: 34 g/dL (ref 30.0–36.0)
MCV: 88.4 fl (ref 78.0–100.0)
Platelets: 279 10*3/uL (ref 150.0–400.0)
RBC: 4.61 Mil/uL (ref 3.87–5.11)
RDW: 12.5 % (ref 11.5–15.5)
WBC: 6.5 10*3/uL (ref 4.0–10.5)

## 2021-12-21 LAB — COMPREHENSIVE METABOLIC PANEL
ALT: 16 U/L (ref 0–35)
AST: 15 U/L (ref 0–37)
Albumin: 4.5 g/dL (ref 3.5–5.2)
Alkaline Phosphatase: 52 U/L (ref 39–117)
BUN: 14 mg/dL (ref 6–23)
CO2: 29 mEq/L (ref 19–32)
Calcium: 9.5 mg/dL (ref 8.4–10.5)
Chloride: 103 mEq/L (ref 96–112)
Creatinine, Ser: 0.88 mg/dL (ref 0.40–1.20)
GFR: 87.59 mL/min (ref >60.00)
Glucose, Bld: 110 mg/dL — ABNORMAL HIGH (ref 70–99)
Potassium: 4.2 mEq/L (ref 3.5–5.1)
Sodium: 139 mEq/L (ref 135–145)
Total Bilirubin: 0.8 mg/dL (ref 0.2–1.2)
Total Protein: 7.4 g/dL (ref 6.0–8.3)

## 2022-01-26 ENCOUNTER — Other Ambulatory Visit: Payer: Self-pay | Admitting: Family Medicine

## 2022-01-26 DIAGNOSIS — M797 Fibromyalgia: Secondary | ICD-10-CM

## 2022-01-26 MED ORDER — GABAPENTIN 300 MG PO CAPS: 300.0000 mg | ORAL_CAPSULE | Freq: Three times a day (TID) | ORAL | 10 refills | Status: DC

## 2022-05-04 ENCOUNTER — Encounter: Payer: Self-pay | Admitting: Family Medicine

## 2022-05-16 ENCOUNTER — Encounter: Payer: Self-pay | Admitting: Family Medicine

## 2022-05-16 ENCOUNTER — Ambulatory Visit (INDEPENDENT_AMBULATORY_CARE_PROVIDER_SITE_OTHER): Payer: Commercial Managed Care - HMO | Admitting: Family Medicine

## 2022-05-16 VITALS — BP 130/82 | HR 82 | Temp 99.1°F | Ht 70.0 in | Wt 227.5 lb

## 2022-05-16 DIAGNOSIS — F339 Major depressive disorder, recurrent, unspecified: Secondary | ICD-10-CM

## 2022-05-16 DIAGNOSIS — F411 Generalized anxiety disorder: Secondary | ICD-10-CM | POA: Diagnosis not present

## 2022-05-16 DIAGNOSIS — F3281 Premenstrual dysphoric disorder: Secondary | ICD-10-CM

## 2022-05-16 MED ORDER — SERTRALINE HCL 50 MG PO TABS: 50.0000 mg | ORAL_TABLET | Freq: Every day | ORAL | 0 refills | Status: DC

## 2022-05-16 NOTE — Progress Notes (Signed)
Chief Complaint  Patient presents with   Medication Problem    Subjective Carol Marquez presents for f/u anxiety/depression.  Pt is currently being treated with Elavil 25 mg/d.  Reports struggling since digging into more topics in therapy since treatment. No thoughts of harming self or others. No self-medication with alcohol, prescription drugs or illicit drugs. Pt is following with a counselor/psychologist.  Past Medical History:  Diagnosis Date   Asthma    Diagnosed age 4 or 32 yo.   Calculus of gallbladder    Endometriosis    Fibromyalgia    Allergies as of 05/16/2022       Reactions   Bee Venom Anaphylaxis, Swelling   Shellfish Allergy Anaphylaxis, Hives   Codeine Nausea Only        Medication List        Accurate as of May 16, 2022  3:32 PM. If you have any questions, ask your nurse or doctor.          STOP taking these medications    amitriptyline 25 MG tablet Commonly known as: ELAVIL Stopped by: Sharlene Dory, DO   ibuprofen 200 MG tablet Commonly known as: ADVIL Stopped by: Sharlene Dory, DO       TAKE these medications    albuterol 108 (90 Base) MCG/ACT inhaler Commonly known as: VENTOLIN HFA INHALE 2 PUFFS INTO THE LUNGS EVERY 4 HOURS AS NEEDED FOR WHEEZING OR SHORTNESS OF BREATH   azelastine 0.1 % nasal spray Commonly known as: ASTELIN Place 2 sprays into both nostrils 2 (two) times daily. Use in each nostril as directed   CRANBERRY CONCENTRATE/VITAMINC PO Take 2 capsules by mouth daily.   diphenhydrAMINE 25 MG tablet Commonly known as: BENADRYL Take 50 mg by mouth daily as needed for itching or allergies.   drospirenone-ethinyl estradiol 3-0.02 MG tablet Commonly known as: YAZ Take 1 tablet by mouth at bedtime.   fexofenadine 180 MG tablet Commonly known as: ALLEGRA Take 1 tablet (180 mg total) by mouth daily.   fluticasone 110 MCG/ACT inhaler Commonly known as: Flovent HFA Inhale 2 puffs into the lungs  in the morning and at bedtime. Rinse mouth out after use.   fluticasone 50 MCG/ACT nasal spray Commonly known as: FLONASE Place 2 sprays into both nostrils daily.   gabapentin 300 MG capsule Commonly known as: Neurontin Take 1 capsule (300 mg total) by mouth 3 (three) times daily.   Hair Skin and Nails Formula Tabs Take 1 tablet by mouth daily.   meloxicam 15 MG tablet Commonly known as: MOBIC TAKE 1 TABLET(15 MG) BY MOUTH DAILY   montelukast 10 MG tablet Commonly known as: SINGULAIR TAKE 1 TABLET(10 MG) BY MOUTH AT BEDTIME   sertraline 50 MG tablet Commonly known as: ZOLOFT Take 1 tablet (50 mg total) by mouth daily. Take 1/2 tab daily for first 2 weeks. Started by: Sharlene Dory, DO        Exam BP 130/82 (BP Location: Left Arm, Patient Position: Sitting, Cuff Size: Large)   Pulse 82   Temp 99.1 F (37.3 C) (Oral)   Ht 5\' 10"  (1.778 m)   Wt 227 lb 8 oz (103.2 kg)   SpO2 99%   BMI 32.64 kg/m  General:  well developed, well nourished, in no apparent distress Lungs:  No respiratory distress Psych: well oriented with normal range of affect and age-appropriate judgement/insight, alert and oriented x4.  Assessment and Plan  Depression, recurrent (HCC) - Plan: sertraline (ZOLOFT) 50 MG tablet  GAD (generalized anxiety disorder) - Plan: sertraline (ZOLOFT) 50 MG tablet  PMDD (premenstrual dysphoric disorder) - Plan: sertraline (ZOLOFT) 50 MG tablet  Chronic, uncontrolled. Start Sertraline 25 mg/d for 2 weeks and then start 50 mg/d. Counseled on exercise. Cont w counseling.  F/u in 5 weeks. The patient voiced understanding and agreement to the plan.  Jilda Roche [place], DO 05/16/22 3:32 PM

## 2022-05-16 NOTE — Patient Instructions (Addendum)
Continue with counseling team.   Aim to do some physical exertion for 150 minutes per week. This is typically divided into 5 days per week, 30 minutes per day. The activity should be enough to get your heart rate up. Anything is better than nothing if you have time constraints.  Coping skills Choose 5 that work for you: Take a deep breath Count to 20 Read a book Do a puzzle Meditate Bake Sing Knit Garden Pray Go outside Call a friend Listen to music Take a walk Color Send a note Take a bath Watch a movie Be alone in a quiet place Pet an animal Visit a friend Journal Exercise Stretch   Let us know if you need anything.

## 2022-06-05 ENCOUNTER — Other Ambulatory Visit: Payer: Self-pay

## 2022-06-05 ENCOUNTER — Emergency Department (HOSPITAL_COMMUNITY)
Admission: EM | Admit: 2022-06-05 | Discharge: 2022-06-05 | Disposition: A | Discharge status: Home / Self Care | Payer: Commercial Managed Care - HMO | Attending: Emergency Medicine | Admitting: Emergency Medicine

## 2022-06-05 ENCOUNTER — Encounter (HOSPITAL_COMMUNITY): Payer: Self-pay

## 2022-06-05 ENCOUNTER — Emergency Department (HOSPITAL_COMMUNITY): Payer: Commercial Managed Care - HMO

## 2022-06-05 DIAGNOSIS — J45909 Unspecified asthma, uncomplicated: Secondary | ICD-10-CM | POA: Diagnosis not present

## 2022-06-05 DIAGNOSIS — E860 Dehydration: Secondary | ICD-10-CM | POA: Insufficient documentation

## 2022-06-05 DIAGNOSIS — Z7951 Long term (current) use of inhaled steroids: Secondary | ICD-10-CM | POA: Diagnosis not present

## 2022-06-05 DIAGNOSIS — R002 Palpitations: Secondary | ICD-10-CM | POA: Insufficient documentation

## 2022-06-05 LAB — BASIC METABOLIC PANEL
Anion gap: 17 — ABNORMAL HIGH (ref 5–15)
BUN: 14 mg/dL (ref 6–20)
CO2: 19 mmol/L — ABNORMAL LOW (ref 22–32)
Calcium: 9.3 mg/dL (ref 8.9–10.3)
Chloride: 101 mmol/L (ref 98–111)
Creatinine, Ser: 0.93 mg/dL (ref 0.44–1.00)
GFR, Estimated: >60 mL/min (ref >60)
Glucose, Bld: 92 mg/dL (ref 70–99)
Potassium: 3.7 mmol/L (ref 3.5–5.1)
Sodium: 137 mmol/L (ref 135–145)

## 2022-06-05 LAB — CBC
HCT: 41.8 % (ref 36.0–46.0)
Hemoglobin: 14.3 g/dL (ref 12.0–15.0)
MCH: 31 pg (ref 26.0–34.0)
MCHC: 34.2 g/dL (ref 30.0–36.0)
MCV: 90.5 fL (ref 80.0–100.0)
Platelets: 252 10*3/uL (ref 150–400)
RBC: 4.62 MIL/uL (ref 3.87–5.11)
RDW: 12 % (ref 11.5–15.5)
WBC: 7.3 10*3/uL (ref 4.0–10.5)
nRBC: 0 % (ref 0.0–0.2)

## 2022-06-05 LAB — I-STAT BETA HCG BLOOD, ED (MC, WL, AP ONLY): I-stat hCG, quantitative: 7.4 m[IU]/mL — ABNORMAL HIGH (ref <5)

## 2022-06-05 LAB — TROPONIN I (HIGH SENSITIVITY)
Troponin I (High Sensitivity): 3 ng/L (ref <18)
Troponin I (High Sensitivity): 3 ng/L (ref <18)

## 2022-06-05 LAB — PREGNANCY, URINE: Preg Test, Ur: NEGATIVE

## 2022-06-05 MED ORDER — SODIUM CHLORIDE 0.9 % IV BOLUS
1000.0000 mL | Freq: Once | INTRAVENOUS | Status: AC
  Administered 2022-06-05: 1000 mL via INTRAVENOUS

## 2022-06-05 NOTE — ED Notes (Signed)
Ambulates in hall to restroom independently and without dizziness, gait steady.

## 2022-06-05 NOTE — ED Triage Notes (Signed)
Pt arrives with c/o palpitations that started this morning. Per pt, during the episode she had CP that radiated into her and dizziness. Pt denies SOB. Per pt, her HR was in the 140s.

## 2022-06-05 NOTE — ED Provider Notes (Signed)
[place] EMERGENCY DEPARTMENT AT [facility] Provider Note   CSN: [id number] Arrival date & time: 06/05/22  1551     History  Chief Complaint  Patient presents with   Palpitations    Carol Marquez is a 32 y.o. femalePast medical history significant for anxiety, asthma, fibromyalgia who presents with concern for elevated heart rate.  Patient reports that she was feeling abnormal, checked her heart rate on her ambulatory pulse ox at home and it was 140 earlier today.  She reports that it kept elevating when she would get up to do things.  Patient reports that she has had poor oral intake secondary to her chronic disease processes and thinks that she may be dehydrated.  On exam she does appear mildly dehydrated and requesting fluids.  She reports no chest pain or discomfort at this time but did have some earlier today.  She reports it felt like sharp stabbing pain she denies any history of blood clots, no previous history of DVT, PE, no recent travel, no clotting disorder.   Palpitations      Home Medications Prior to Admission medications   Medication Sig Start Date End Date Taking? Authorizing Provider  albuterol (VENTOLIN HFA) 108 (90 Base) MCG/ACT inhaler INHALE 2 PUFFS INTO THE LUNGS EVERY 4 HOURS AS NEEDED FOR WHEEZING OR SHORTNESS OF BREATH 04/05/20   Wendling, Jilda Roche, DO  azelastine (ASTELIN) 0.1 % nasal spray Place 2 sprays into both nostrils 2 (two) times daily. Use in each nostril as directed 11/25/20   Sharlene Dory, DO  Cranberry-Vitamin C (CRANBERRY CONCENTRATE/VITAMINC PO) Take 2 capsules by mouth daily.    [provider]  diphenhydrAMINE (BENADRYL) 25 MG tablet Take 50 mg by mouth daily as needed for itching or allergies.    [provider]  drospirenone-ethinyl estradiol (YAZ) 3-0.02 MG tablet Take 1 tablet by mouth at bedtime. 05/26/21   Wendling, Jilda Roche, DO  fexofenadine (ALLEGRA) 180 MG tablet Take 1 tablet (180  mg total) by mouth daily. 02/12/19   Julieanne Manson, MD  fluticasone (FLONASE) 50 MCG/ACT nasal spray Place 2 sprays into both nostrils daily. 04/05/20   Sharlene Dory, DO  fluticasone (FLOVENT HFA) 110 MCG/ACT inhaler Inhale 2 puffs into the lungs in the morning and at bedtime. Rinse mouth out after use. 05/26/21   Wendling, Jilda Roche, DO  gabapentin (NEURONTIN) 300 MG capsule Take 1 capsule (300 mg total) by mouth 3 (three) times daily. 01/26/22   Sharlene Dory, DO  meloxicam (MOBIC) 15 MG tablet TAKE 1 TABLET(15 MG) BY MOUTH DAILY 08/14/21   Sharlene Dory, DO  montelukast (SINGULAIR) 10 MG tablet TAKE 1 TABLET(10 MG) BY MOUTH AT BEDTIME 10/13/21   Wendling, Jilda Roche, DO  Multiple Vitamins-Minerals (HAIR SKIN AND NAILS FORMULA) TABS Take 1 tablet by mouth daily.    [provider]  sertraline (ZOLOFT) 50 MG tablet Take 1 tablet (50 mg total) by mouth daily. Take 1/2 tab daily for first 2 weeks. 05/16/22   Sharlene Dory, DO  metoCLOPramide (REGLAN) 10 MG tablet Take 1 tablet (10 mg total) by mouth every 8 (eight) hours as needed for nausea. 06/27/19 07/25/19  Caccavale, Sophia, PA-C      Allergies    Bee venom, Shellfish allergy, and Codeine    Review of Systems   Review of Systems  Cardiovascular:  Positive for palpitations.  All other systems reviewed and are negative.   Physical Exam Updated Vital Signs BP Marland Kitchen)  146/95   Pulse 61   Temp 98.2 F (36.8 C) (Oral)   Resp 18   SpO2 100%  Physical Exam Vitals and nursing note reviewed.  Constitutional:      General: She is not in acute distress.    Appearance: Normal appearance.  HENT:     Head: Normocephalic and atraumatic.     Comments: Dry mucous membranes Eyes:     General:        Right eye: No discharge.        Left eye: No discharge.  Cardiovascular:     Rate and Rhythm: Normal rate and regular rhythm.     Heart sounds: No murmur heard.    No friction rub. No gallop.   Pulmonary:     Effort: Pulmonary effort is normal.     Breath sounds: Normal breath sounds.  Abdominal:     General: Bowel sounds are normal.     Palpations: Abdomen is soft.  Skin:    General: Skin is warm and dry.     Capillary Refill: Capillary refill takes less than 2 seconds.  Neurological:     Mental Status: She is alert and oriented to person, place, and time.  Psychiatric:        Mood and Affect: Mood normal.        Behavior: Behavior normal.     ED Results / Procedures / Treatments   Labs (all labs ordered are listed, but only abnormal results are displayed) Labs Reviewed  BASIC METABOLIC PANEL - Abnormal; Notable for the following components:      Result Value   CO2 19 (*)    Anion gap 17 (*)    All other components within normal limits  I-STAT BETA HCG BLOOD, ED ([facility], [facility], [facility] ONLY) - Abnormal; Notable for the following components:   I-stat hCG, quantitative 7.4 (*)    All other components within normal limits  CBC  PREGNANCY, URINE  TROPONIN I (HIGH SENSITIVITY)  TROPONIN I (HIGH SENSITIVITY)    EKG None  Radiology DG Chest 2 View  Result Date: 06/05/2022 CLINICAL DATA:  Chest pain EXAM: CHEST - 2 VIEW COMPARISON:  10/06/2018 FINDINGS: The heart size and mediastinal contours are within normal limits. Both lungs are clear. The visualized skeletal structures are unremarkable. IMPRESSION: No active cardiopulmonary disease. Electronically Signed   By: Helyn Numbers M.D.   On: 06/05/2022 16:36    Procedures Procedures    Medications Ordered in ED Medications  sodium chloride 0.9 % bolus 1,000 mL (0 mLs Intravenous Stopped 06/05/22 2214)    ED Course/ Medical Decision Making/ A&P                              Medical Decision Making Amount and/or Complexity of Data Reviewed Labs: ordered. Radiology: ordered.   This patient is a 32 y.o. female  who presents to the ED for concern of concern for possible palpitations that started this morning based on  the pulse ox meter that she has at home.   Differential diagnoses prior to evaluation: The emergent differential diagnosis includes, but is not limited to, tachyarrhythmia, WPW, LGL, ACS, PE, anxiety, dehydration, electrolyte abnormality, drug use, versus other this is not an exhaustive differential.   Past Medical History / Co-morbidities / Social History: Fibromyalgia, asthma, depression, obesity  Physical Exam: Physical exam performed. The pertinent findings include: Patient is overall well-appearing, her blood pressure is mildly elevated in  the emergency department, max at 152/94, she has had stable oxygen saturation on room air, she has been in normal sinus rhythm for the duration of her emergency department visit.  No evidence of intermittent tachyarrhythmia on my exam.  Lab Tests/Imaging studies: I personally interpreted labs/imaging and the pertinent results include: BMP notable for mild bicarb deficit, CO2 19, with an elevated anion gap likely secondary to her mild hypercarbia, and report of some hyper ventilation earlier.  Independently interpreted plain film chest x-ray which shows no evidence of acute intrathoracic abnormality.  I agree with the radiologist interpretation.  Cardiac monitoring: EKG obtained and interpreted by my attending physician which shows: Normal sinus rhythm, no evidence of shortened PR, or other abnormalities.   Medications: I ordered medication including fluid bolus for dehydration.  I have reviewed the patients home medicines and have made adjustments as needed.   Disposition: After consideration of the diagnostic results and the patients response to treatment, I feel that patient is stable for discharge, no evidence of the palpitations for which she came in on my exam today, but some evidence of dehydration, patient feeling better after fluids, and is stable for discharge, encouraged to follow-up with her PCP, return to the emergency department if she  continues to have palpitations, or begins to have chest discomfort.Marland Kitchen   emergency department workup does not suggest an emergent condition requiring admission or immediate intervention beyond what has been performed at this time. The plan is: as above, encourage rehydration, discharge home. The patient is safe for discharge and has been instructed to return immediately for worsening symptoms, change in symptoms or any other concerns.  Final Clinical Impression(s) / ED Diagnoses Final diagnoses:  Palpitations  Dehydration    Rx / DC Orders ED Discharge Orders     None         Olene Floss, PA-C 06/05/22 2251    Tanda Rockers A, DO 06/06/22 0007

## 2022-06-13 ENCOUNTER — Encounter: Payer: Self-pay | Admitting: Obstetrics and Gynecology

## 2022-06-13 ENCOUNTER — Ambulatory Visit (INDEPENDENT_AMBULATORY_CARE_PROVIDER_SITE_OTHER): Payer: Commercial Managed Care - HMO | Admitting: Obstetrics and Gynecology

## 2022-06-13 ENCOUNTER — Other Ambulatory Visit (HOSPITAL_COMMUNITY)
Admission: RE | Admit: 2022-06-13 | Discharge: 2022-06-13 | Disposition: A | Discharge status: Home / Self Care | Payer: Commercial Managed Care - HMO | Source: Ambulatory Visit | Attending: Obstetrics and Gynecology | Admitting: Obstetrics and Gynecology

## 2022-06-13 VITALS — BP 137/89 | HR 99 | Ht 70.0 in | Wt 217.3 lb

## 2022-06-13 DIAGNOSIS — N809 Endometriosis, unspecified: Secondary | ICD-10-CM

## 2022-06-13 DIAGNOSIS — Z01419 Encounter for gynecological examination (general) (routine) without abnormal findings: Secondary | ICD-10-CM | POA: Diagnosis present

## 2022-06-13 DIAGNOSIS — Z124 Encounter for screening for malignant neoplasm of cervix: Secondary | ICD-10-CM

## 2022-06-13 NOTE — Progress Notes (Signed)
ANNUAL EXAM Patient name: Carol Marquez MRN [id number]  Date of birth: 08-30-1990 Chief Complaint:   Gynecologic Exam  History of Present Illness:   Carol Marquez is a 32 y.o. G0P0000 being seen today for a routine annual exam.  Current complaints: pap due, painful previously   Menstrual concerns? No   Breast or nipple changes? No  Contraception use? Yes  Sexually active? No   Had pain with pap in the past Using OCPs for pain - has monthly cycles and states OCPs prevent pain throughout the month Rare non-penetrative masturbation States had prior CT scan that showed narrow ureter and wondering if it's related to endometriosis Also has pain on left side/flank and dark urine during each cycle.    Patient's last menstrual period was 06/08/2022 (exact date).    Last pap     Component Value Date/Time   DIAGPAP  09/24/2018 1035    - Negative for intraepithelial lesion or malignancy (NILM)   ADEQPAP  09/24/2018 1035    Satisfactory for evaluation; transformation zone component PRESENT.     H/O abnormal pap: no Last mammogram: n/a Last colonoscopy: n/a.      06/13/2022    3:39 PM 05/16/2022    3:05 PM 12/20/2021    1:42 PM 07/12/2021    2:24 PM 04/05/2020    3:14 PM  Depression screen PHQ 2/9  Decreased Interest 2 0 0 0 0  Down, Depressed, Hopeless 2 2 3 1  0  PHQ - 2 Score 4 2 3 1  0  Altered sleeping 3 2 3 3 3   Tired, decreased energy 2 0 0 2 0  Change in appetite 2 2 2  0 0  Feeling bad or failure about yourself  2 0 0 0 0  Trouble concentrating 1 1 0 0 0  Moving slowly or fidgety/restless 0 0 0 0 0  Suicidal thoughts 0 0 0 0 0  PHQ-9 Score 14 7 8 6 3   Difficult doing work/chores  Not difficult at all Not difficult at all Not difficult at all Somewhat difficult         No data to display           Review of Systems:   Pertinent items are noted in HPI Denies any headaches, blurred vision, fatigue, shortness of breath, chest pain, abdominal pain, abnormal  vaginal discharge/itching/odor/irritation, problems with periods, bowel movements, urination, or intercourse unless otherwise stated above. Pertinent History Reviewed:  Reviewed past medical,surgical, social and family history.  Reviewed problem list, medications and allergies. Physical Assessment:   Vitals:   06/13/22 1532  BP: 137/89  Pulse: 99  Weight: 217 lb 4.8 oz (98.6 kg)  Height: 5\' 10"  (1.778 m)  Body mass index is 31.18 kg/m.        Physical Examination:   General appearance - well appearing, and in no distress  Mental status - alert, oriented to person, place, and time  Psych:  She has a normal mood and affect  Skin - warm and dry, normal color, no suspicious lesions noted  Chest - effort normal, all lung fields clear to auscultation bilaterally  Heart - normal rate and regular rhythm  Breasts - breasts appear normal, no suspicious masses, no skin or nipple changes or  axillary nodes  Abdomen - soft, mild abdominal tenderness with - carnett sign, nondistended, no masses or organomegaly  Pelvic -  VULVA: normal appearing vulva with no masses, tenderness or lesions   VAGINA: normal appearing vagina with  normal color and discharge, no lesions   CERVIX: normal appearing cervix without discharge or lesions, small volume dark blood and CMT w/ pap brush   Thin prep pap is done with HR HPV cotesting  Extremities:  No swelling or varicosities noted  Chaperone present for exam  No results found for this or any previous visit (from the past 24 hour(s)).    Assessment & Plan:   1. Well woman exam with routine gynecological exam - Cervical cancer screening: Discussed guidelines. Pap with HPV collected - GC/CT: not indicated - Birth Control: OCPs - Breast Health: Encouraged self breast awareness/SBE. Teaching provided.  - F/U 12 months and prn  - Cytology - PAP( [facility])  2. Screening for cervical cancer Pap collected  3. Endometriosis Discussed that narrowing of  ureter on prior CT scan could be related to endometriosis. Pain is fairly well controlled at this time, discussed considering continuous OCP use to achieve amenorrhea/menstrual suppression and seeing if that helps with pain. Patient has been feeling well with current regimen but will consider.     Meds: No orders of the defined types were placed in this encounter.   Follow-up: Return if symptoms worsen or fail to improve, for Annual GYN.  Lorriane Shire, MD 06/13/2022 3:50 PM

## 2022-06-18 LAB — CYTOLOGY - PAP
Comment: NEGATIVE
Diagnosis: NEGATIVE
High risk HPV: NEGATIVE

## 2022-06-22 ENCOUNTER — Ambulatory Visit: Payer: Commercial Managed Care - HMO | Admitting: Family Medicine

## 2022-06-26 ENCOUNTER — Encounter: Payer: Self-pay | Admitting: Family Medicine

## 2022-06-27 ENCOUNTER — Ambulatory Visit (INDEPENDENT_AMBULATORY_CARE_PROVIDER_SITE_OTHER): Payer: Commercial Managed Care - HMO | Admitting: Family Medicine

## 2022-06-27 VITALS — BP 121/79 | HR 77 | Temp 99.0°F | Ht 70.0 in | Wt 218.2 lb

## 2022-06-27 DIAGNOSIS — J4530 Mild persistent asthma, uncomplicated: Secondary | ICD-10-CM

## 2022-06-27 DIAGNOSIS — F3281 Premenstrual dysphoric disorder: Secondary | ICD-10-CM

## 2022-06-27 DIAGNOSIS — F339 Major depressive disorder, recurrent, unspecified: Secondary | ICD-10-CM | POA: Diagnosis not present

## 2022-06-27 DIAGNOSIS — F411 Generalized anxiety disorder: Secondary | ICD-10-CM | POA: Diagnosis not present

## 2022-06-27 DIAGNOSIS — F4310 Post-traumatic stress disorder, unspecified: Secondary | ICD-10-CM | POA: Insufficient documentation

## 2022-06-27 MED ORDER — METHYLPREDNISOLONE ACETATE 80 MG/ML IJ SUSP
80.0000 mg | Freq: Once | INTRAMUSCULAR | Status: AC
Start: 2022-06-27 — End: 2022-06-27
  Administered 2022-06-27: 80 mg via INTRAMUSCULAR

## 2022-06-27 MED ORDER — SERTRALINE HCL 100 MG PO TABS: 100.0000 mg | ORAL_TABLET | Freq: Every day | ORAL | 3 refills | Status: DC

## 2022-06-27 MED ORDER — ALBUTEROL SULFATE HFA 108 (90 BASE) MCG/ACT IN AERS
2.0000 | INHALATION_SPRAY | RESPIRATORY_TRACT | 2 refills | Status: AC | PRN
Start: 2022-06-27 — End: ?

## 2022-06-27 NOTE — Patient Instructions (Addendum)
Stay active.   Send me a message in 2 days if no better with the breathing.   Let us know if you need anything.

## 2022-06-27 NOTE — Progress Notes (Signed)
Chief Complaint  Patient presents with   Follow-up    Sertraline needs to be increased    Subjective Carol Marquez presents for f/u anxiety/depression.  Pt is currently being treated with Zoloft 50 mg/d.  Reports 50% improvement since treatment. No thoughts of harming self or others. No self-medication with alcohol, prescription drugs or illicit drugs. Pt is following with a counselor/psychologist.  Patient has a history of asthma and she woke up with some chest tightness.  She reports feeling there is a helium balloon in her chest.  This is typical in the early stages of exacerbation.  She was around freshly cut grass yesterday which has historically been a possible trigger for her asthma.  No fevers, current wheezing, shortness of breath, or upper respiratory symptoms.  Past Medical History:  Diagnosis Date   Asthma    Diagnosed age 32 or 32 yo.   Calculus of gallbladder    Chronic fatigue syndrome 2021   Endometriosis    Fibromyalgia    Allergies as of 06/27/2022       Reactions   Bee Venom Anaphylaxis, Swelling   Shellfish Allergy Anaphylaxis, Hives   Morphine    Codeine Nausea Only        Medication List        Accurate as of June 27, 2022 11:02 AM. If you have any questions, ask your nurse or doctor.          albuterol 108 (90 Base) MCG/ACT inhaler Commonly known as: VENTOLIN HFA Inhale 2 puffs into the lungs every 4 (four) hours as needed for wheezing or shortness of breath.   azelastine 0.1 % nasal spray Commonly known as: ASTELIN Place 2 sprays into both nostrils 2 (two) times daily. Use in each nostril as directed   CRANBERRY CONCENTRATE/VITAMINC PO Take 2 capsules by mouth daily.   diphenhydrAMINE 25 MG tablet Commonly known as: BENADRYL Take 50 mg by mouth daily as needed for itching or allergies.   drospirenone-ethinyl estradiol 3-0.02 MG tablet Commonly known as: YAZ Take 1 tablet by mouth at bedtime.   fexofenadine 180 MG  tablet Commonly known as: ALLEGRA Take 1 tablet (180 mg total) by mouth daily.   fluticasone 110 MCG/ACT inhaler Commonly known as: Flovent HFA Inhale 2 puffs into the lungs in the morning and at bedtime. Rinse mouth out after use.   fluticasone 50 MCG/ACT nasal spray Commonly known as: FLONASE Place 2 sprays into both nostrils daily.   gabapentin 300 MG capsule Commonly known as: Neurontin Take 1 capsule (300 mg total) by mouth 3 (three) times daily.   Hair Skin and Nails Formula Tabs Take 1 tablet by mouth daily.   meloxicam 15 MG tablet Commonly known as: MOBIC TAKE 1 TABLET(15 MG) BY MOUTH DAILY   montelukast 10 MG tablet Commonly known as: SINGULAIR TAKE 1 TABLET(10 MG) BY MOUTH AT BEDTIME   sertraline 100 MG tablet Commonly known as: ZOLOFT Take 1 tablet (100 mg total) by mouth daily. What changed:  medication strength how much to take additional instructions Changed by: Sharlene Dory, DO        Exam BP 121/79 (BP Location: Left Arm, Patient Position: Sitting, Cuff Size: Large)   Pulse 77   Temp 99 F (37.2 C) (Oral)   Ht 5\' 10"  (1.778 m)   Wt 218 lb 4 oz (99 kg)   LMP 06/08/2022 (Exact Date)   SpO2 99%   BMI 31.32 kg/m  General:  well developed, well nourished, in  no apparent distress HEENT: Ear canals are patent without otorrhea, TMs negative bilaterally, nares are patent without rhinorrhea, MMM Heart: RRR Lungs: CTAB.  No respiratory distress Psych: well oriented with normal range of affect and age-appropriate judgement/insight, alert and oriented x4.  Assessment and Plan  Depression, recurrent (HCC)  GAD (generalized anxiety disorder)  PMDD (premenstrual dysphoric disorder)  Mild persistent asthma without complication - Plan: albuterol (VENTOLIN HFA) 108 (90 Base) MCG/ACT inhaler, methylPREDNISolone acetate (DEPO-MEDROL) injection 80 mg  1/2/3.  Chronic, unstable.  Increase sertraline from 50 mg daily to 100 mg daily.  Continue  with counseling team.  Continue to stay active.  Follow-up in 1 month recheck. 4.  Perhaps the start of an exacerbation.  80 mg of Depo-Medrol today.  She will send a message in a couple days if no better.  Will consider 5-day prednisone burst 40 mg daily in that situation. The patient voiced understanding and agreement to the plan.  [name], DO 06/27/22 11:02 AM

## 2022-07-09 ENCOUNTER — Other Ambulatory Visit: Payer: Self-pay | Admitting: Family Medicine

## 2022-07-09 DIAGNOSIS — Z3040 Encounter for surveillance of contraceptives, unspecified: Secondary | ICD-10-CM

## 2022-07-09 MED ORDER — DROSPIRENONE-ETHINYL ESTRADIOL 3-0.02 MG PO TABS
1.0000 | ORAL_TABLET | Freq: Every day | ORAL | 3 refills | Status: DC
Start: 2022-07-09 — End: 2023-04-09

## 2022-07-27 ENCOUNTER — Encounter: Payer: Self-pay | Admitting: Family Medicine

## 2022-07-27 ENCOUNTER — Telehealth (INDEPENDENT_AMBULATORY_CARE_PROVIDER_SITE_OTHER): Payer: Commercial Managed Care - HMO | Admitting: Family Medicine

## 2022-07-27 DIAGNOSIS — F339 Major depressive disorder, recurrent, unspecified: Secondary | ICD-10-CM

## 2022-07-27 DIAGNOSIS — F411 Generalized anxiety disorder: Secondary | ICD-10-CM | POA: Diagnosis not present

## 2022-07-27 MED ORDER — SERTRALINE HCL 100 MG PO TABS: 100.0000 mg | ORAL_TABLET | Freq: Every day | ORAL | 3 refills | Status: DC

## 2022-07-27 NOTE — Progress Notes (Signed)
Chief Complaint  Patient presents with   Follow-up    1 month     Subjective Carol Marquez presents for f/u anxiety/depression. We are interacting via web portal for an electronic face-to-face visit. I verified patient's ID using 2 identifiers. Patient agreed to proceed with visit via this method. Patient is at home, I am at office. Patient and I are present for visit.   Pt is currently being treated with Zoloft 100 mg/d.  Reports doing 70-80% improvement since treatment. No thoughts of harming self or others. No self-medication with alcohol, prescription drugs or illicit drugs. Pt is following with a counselor/psychologist.  Past Medical History:  Diagnosis Date   Asthma    Diagnosed age 56 or 32 yo.   Calculus of gallbladder    Chronic fatigue syndrome 2021   Endometriosis    Fibromyalgia    Allergies as of 07/27/2022       Reactions   Bee Venom Anaphylaxis, Swelling   Shellfish Allergy Anaphylaxis, Hives   Morphine    Codeine Nausea Only        Medication List        Accurate as of July 27, 2022  1:29 PM. If you have any questions, ask your nurse or doctor.          albuterol 108 (90 Base) MCG/ACT inhaler Commonly known as: VENTOLIN HFA Inhale 2 puffs into the lungs every 4 (four) hours as needed for wheezing or shortness of breath.   azelastine 0.1 % nasal spray Commonly known as: ASTELIN Place 2 sprays into both nostrils 2 (two) times daily. Use in each nostril as directed   CRANBERRY CONCENTRATE/VITAMINC PO Take 2 capsules by mouth daily.   diphenhydrAMINE 25 MG tablet Commonly known as: BENADRYL Take 50 mg by mouth daily as needed for itching or allergies.   drospirenone-ethinyl estradiol 3-0.02 MG tablet Commonly known as: YAZ Take 1 tablet by mouth at bedtime.   fexofenadine 180 MG tablet Commonly known as: ALLEGRA Take 1 tablet (180 mg total) by mouth daily.   fluticasone 110 MCG/ACT inhaler Commonly known as: Flovent HFA Inhale 2  puffs into the lungs in the morning and at bedtime. Rinse mouth out after use.   fluticasone 50 MCG/ACT nasal spray Commonly known as: FLONASE Place 2 sprays into both nostrils daily.   gabapentin 300 MG capsule Commonly known as: Neurontin Take 1 capsule (300 mg total) by mouth 3 (three) times daily.   Hair Skin and Nails Formula Tabs Take 1 tablet by mouth daily.   meloxicam 15 MG tablet Commonly known as: MOBIC TAKE 1 TABLET(15 MG) BY MOUTH DAILY   montelukast 10 MG tablet Commonly known as: SINGULAIR TAKE 1 TABLET(10 MG) BY MOUTH AT BEDTIME   sertraline 100 MG tablet Commonly known as: ZOLOFT Take 1 tablet (100 mg total) by mouth daily.        Exam No conversational dyspnea Age appropriate judgment and insight Nml affect and mood  Assessment and Plan  Depression, recurrent (HCC)  GAD (generalized anxiety disorder)  Chronic, stable. Cont Zoloft 100 mg/d.  F/u in 6 mo. The patient voiced understanding and agreement to the plan.  Jilda Roche [place], DO 07/27/22 1:29 PM

## 2022-08-08 ENCOUNTER — Other Ambulatory Visit: Payer: Self-pay | Admitting: Family Medicine

## 2022-08-08 DIAGNOSIS — J4530 Mild persistent asthma, uncomplicated: Secondary | ICD-10-CM

## 2022-08-08 MED ORDER — MONTELUKAST SODIUM 10 MG PO TABS
ORAL_TABLET | ORAL | 2 refills | Status: AC
Start: 2022-08-08 — End: ?

## 2022-09-26 ENCOUNTER — Telehealth: Payer: Self-pay

## 2022-09-26 NOTE — Telephone Encounter (Signed)
Patient NameFirst: Carol Marquez Last: GRAVELYGender: Unknown DOB: 26-Jun-1990 Age: 32 Y 1 M 23 D [phone] ([facility] - Client Client Site [facility] - Day Provider Arva Chafe- MD Contact Type Call Who Is Calling PatientReturn Phone Number [phone] (Primary) Chief Complaint CHEST PAIN - pain, pressure, heaviness or tightness Reason for Call Symptomatic / Request for Health Information Initial Comment Caller states she is needing to make an appointment. Caller states she is having heart palpitations , her heart will go fast then slow which  auses her to get dizzy and have a headache. Caller states when is beat hard it feels like she is getting punch in the chest. Caller states sometimes she has chest pain. Translation No Nurse Assessment Nurse: Vaughan Browner, RN, Marchelle Folks Date/Time (Eastern Time): 09/26/2022 10:43:30 AM Final Disposition 09/26/2022 10:50:21 AM Call EMS 911 Now Yes [place], RN, Earnestine Leys Disagree/Comply Disagree Caller Understands Yes PreDisposition InappropriateToAsk

## 2022-09-27 ENCOUNTER — Encounter: Payer: Self-pay | Admitting: Family Medicine

## 2022-09-27 ENCOUNTER — Other Ambulatory Visit: Payer: Self-pay | Admitting: Family Medicine

## 2022-09-27 ENCOUNTER — Ambulatory Visit: Payer: Commercial Managed Care - HMO | Attending: Family Medicine

## 2022-09-27 ENCOUNTER — Ambulatory Visit (INDEPENDENT_AMBULATORY_CARE_PROVIDER_SITE_OTHER): Payer: Commercial Managed Care - HMO | Admitting: Family Medicine

## 2022-09-27 VITALS — BP 134/84 | HR 80 | Ht 70.0 in | Wt 217.0 lb

## 2022-09-27 DIAGNOSIS — R002 Palpitations: Secondary | ICD-10-CM

## 2022-09-27 DIAGNOSIS — R5383 Other fatigue: Secondary | ICD-10-CM | POA: Diagnosis not present

## 2022-09-27 LAB — EKG 12-LEAD

## 2022-09-27 NOTE — Progress Notes (Unsigned)
Enrolled for [employer] to mail a ZIO XT long term holter monitor to the patients address on file.   DOD to read. 

## 2022-09-27 NOTE — Progress Notes (Signed)
Acute Office Visit  Subjective:     Patient ID: Carol Marquez, female    DOB: November 25, 1990, 32 y.o.   MRN: [id number]  Chief Complaint  Patient presents with   Palpitations    Palpitations    Patient is in today for palpitations.   Discussed the use of AI scribe software for clinical note transcription with the patient, who gave verbal consent to proceed.  History of Present Illness   The patient has been experiencing palpitations for several months, which they describe as "out of the norm" and significant enough to get their attention. The palpitations have been worsening recently and occur during both periods of rest and stress. The patient describes the sensation as a fluttering, similar to an eye twitch, and at times, a sudden, forceful throb, likened to being kicked in the chest. These episodes are often preceded by feelings of fatigue and flushing and are followed by dizziness and headaches.  On two occasions, the patient experienced a particularly intense episode where they felt a single, powerful throb in the chest, causing difficulty in breathing and a sensation of the heart behaving erratically. These episodes lasted for about 30 seconds to a minute before returning to normal.  The patient does have occasional mild chest discomfort which they attribute to their existing conditions of asthma, anxiety, and fibromyalgia. The palpitations occur a few times a week and have been ongoing for several months. The patient denies any worsening shortness of breath beyond their baseline asthma symptoms, except during the intense palpitation episodes.  The patient had previously sought emergency care for these symptoms in May, where cardiac enzymes, blood count, and metabolic panel were found to be normal. The patient also reports fatigue associated with these episodes.          All review of systems negative except what is listed in the HPI      Objective:    BP 134/84   Pulse  80   Ht 5\' 10"  (1.778 m)   Wt 217 lb (98.4 kg)   SpO2 98%   BMI 31.14 kg/m    Physical Exam Vitals reviewed.  Constitutional:      General: She is not in acute distress.    Appearance: Normal appearance. She is obese. She is not ill-appearing.  Cardiovascular:     Rate and Rhythm: Normal rate and regular rhythm.     Pulses: Normal pulses.     Heart sounds: Normal heart sounds. No murmur heard. Pulmonary:     Effort: Pulmonary effort is normal.     Breath sounds: Normal breath sounds.  Musculoskeletal:     Right lower leg: No edema.     Left lower leg: No edema.  Skin:    General: Skin is warm and dry.  Neurological:     Mental Status: She is alert and oriented to person, place, and time.  Psychiatric:        Mood and Affect: Mood normal.        Behavior: Behavior normal.        Thought Content: Thought content normal.        Judgment: Judgment normal.     Results for orders placed or performed in visit on 09/27/22  EKG 12-Lead  Result Value Ref Range   EKG 12 lead          Assessment & Plan:   Problem List Items Addressed This Visit   None Visit Diagnoses     Palpitations    -  Primary   Relevant Orders   EKG 12-Lead (Completed)   Ambulatory referral to Cardiology   LONG TERM MONITOR (3-14 DAYS)   B12 and Folate Panel   CBC with Differential/Platelet   Comprehensive metabolic panel   IBC + Ferritin   TSH   Fatigue, unspecified type       Relevant Orders   Ambulatory referral to Cardiology   LONG TERM MONITOR (3-14 DAYS)   B12 and Folate Panel   CBC with Differential/Platelet   Comprehensive metabolic panel   IBC + Ferritin   TSH       Several months of palpitations, occurring a few times a week, with some episodes associated with fatigue, dizziness, headache, and a sensation of a strong heartbeat. No correlation with activity. Previous ED visit unremarkable EKG today was normal sinus rhythm, 66 bpm -Order a 7 day cardiac monitor to capture  episodes. -Order additional blood work. -Refer to cardiology for further evaluation, including potential echocardiogram.        No orders of the defined types were placed in this encounter.   Return if symptoms worsen or fail to improve.  Clayborne Dana, NP

## 2022-09-28 ENCOUNTER — Other Ambulatory Visit (INDEPENDENT_AMBULATORY_CARE_PROVIDER_SITE_OTHER): Payer: Commercial Managed Care - HMO

## 2022-09-28 DIAGNOSIS — R002 Palpitations: Secondary | ICD-10-CM

## 2022-09-28 DIAGNOSIS — R5383 Other fatigue: Secondary | ICD-10-CM | POA: Diagnosis not present

## 2022-09-28 LAB — CBC WITH DIFFERENTIAL/PLATELET
Basophils Absolute: 0.1 10*3/uL (ref 0.0–0.1)
Basophils Relative: 0.7 % (ref 0.0–3.0)
Eosinophils Absolute: 0.2 10*3/uL (ref 0.0–0.7)
Eosinophils Relative: 2.1 % (ref 0.0–5.0)
HCT: 41.6 % (ref 36.0–46.0)
Hemoglobin: 13.6 g/dL (ref 12.0–15.0)
Lymphocytes Relative: 28.1 % (ref 12.0–46.0)
Lymphs Abs: 2.1 10*3/uL (ref 0.7–4.0)
MCHC: 32.7 g/dL (ref 30.0–36.0)
MCV: 89.7 fl (ref 78.0–100.0)
Monocytes Absolute: 0.5 10*3/uL (ref 0.1–1.0)
Monocytes Relative: 7 % (ref 3.0–12.0)
Neutro Abs: 4.7 10*3/uL (ref 1.4–7.7)
Neutrophils Relative %: 62.1 % (ref 43.0–77.0)
Platelets: 265 10*3/uL (ref 150.0–400.0)
RBC: 4.64 Mil/uL (ref 3.87–5.11)
RDW: 12.4 % (ref 11.5–15.5)
WBC: 7.5 10*3/uL (ref 4.0–10.5)

## 2022-09-28 LAB — B12 AND FOLATE PANEL
Folate: 10.1 ng/mL (ref >5.9)
Vitamin B-12: 170 pg/mL — ABNORMAL LOW (ref 211–911)

## 2022-09-28 LAB — COMPREHENSIVE METABOLIC PANEL
ALT: 9 U/L (ref 0–35)
AST: 10 U/L (ref 0–37)
Albumin: 4.1 g/dL (ref 3.5–5.2)
Alkaline Phosphatase: 52 U/L (ref 39–117)
BUN: 16 mg/dL (ref 6–23)
CO2: 25 mEq/L (ref 19–32)
Calcium: 9.3 mg/dL (ref 8.4–10.5)
Chloride: 105 mEq/L (ref 96–112)
Creatinine, Ser: 1.12 mg/dL (ref 0.40–1.20)
GFR: 65.23 mL/min (ref >60.00)
Glucose, Bld: 74 mg/dL (ref 70–99)
Potassium: 4.1 mEq/L (ref 3.5–5.1)
Sodium: 140 mEq/L (ref 135–145)
Total Bilirubin: 0.9 mg/dL (ref 0.2–1.2)
Total Protein: 6.9 g/dL (ref 6.0–8.3)

## 2022-09-28 LAB — IBC + FERRITIN
Ferritin: 64.6 ng/mL (ref 10.0–291.0)
Iron: 112 ug/dL (ref 42–145)
Saturation Ratios: 24 % (ref 20.0–50.0)
TIBC: 467.6 ug/dL — ABNORMAL HIGH (ref 250.0–450.0)
Transferrin: 334 mg/dL (ref 212.0–360.0)

## 2022-09-28 LAB — TSH: TSH: 1.92 u[IU]/mL (ref 0.35–5.50)

## 2022-09-29 DIAGNOSIS — R5383 Other fatigue: Secondary | ICD-10-CM | POA: Diagnosis not present

## 2022-09-29 DIAGNOSIS — R002 Palpitations: Secondary | ICD-10-CM | POA: Diagnosis not present

## 2022-10-01 ENCOUNTER — Ambulatory Visit: Payer: Commercial Managed Care - HMO | Admitting: Family Medicine

## 2022-11-26 ENCOUNTER — Ambulatory Visit: Payer: Commercial Managed Care - HMO | Admitting: Internal Medicine

## 2022-11-28 ENCOUNTER — Encounter: Payer: Self-pay | Admitting: Cardiovascular Disease

## 2022-11-28 ENCOUNTER — Ambulatory Visit: Payer: Commercial Managed Care - HMO | Attending: Internal Medicine | Admitting: Cardiovascular Disease

## 2022-11-28 VITALS — BP 118/86 | HR 74 | Ht 70.0 in | Wt 222.0 lb

## 2022-11-28 DIAGNOSIS — R002 Palpitations: Secondary | ICD-10-CM

## 2022-11-28 DIAGNOSIS — R079 Chest pain, unspecified: Secondary | ICD-10-CM

## 2022-11-28 DIAGNOSIS — R5383 Other fatigue: Secondary | ICD-10-CM

## 2022-11-28 MED ORDER — METOPROLOL SUCCINATE ER 25 MG PO TB24: 25.0000 mg | ORAL_TABLET | Freq: Every evening | ORAL | 3 refills | Status: DC

## 2022-11-28 NOTE — Progress Notes (Signed)
Cardiology Office Note:    Date:  11/28/2022   ID:  Carol Marquez, DOB October 14, 1990, MRN [id number]  PCP:  Sharlene Dory, DO   [facility] Providers Cardiologist:  None     Referring MD: Sharlene Dory*   No chief complaint on file. Carol Marquez is a 32 y.o. female who is being seen today for the evaluation of palpitations at the request of Sharlene Dory*.   History of Present Illness:    Carol Marquez is a 32 y.o. female with a history of PTSD, presents with recurrent episodes of palpitations and chest discomfort. The initial episode occurred three to four years ago, described as a sudden, intense sensation akin to being 'kicked from the inside,' causing them to fall to their knees, gasp, and spit. This was associated with persistent chest pain lasting for several days. A similar episode occurred in July or August, again characterized by a rapid heartbeat, a sensation of the heart stopping, and subsequent return to a normal rhythm. These episodes are often accompanied by dizziness, with one instance nearly causing them to pull over while driving. The patient also describes a sensation of pulling and twisting in the chest and back, with pain radiating down the left side of the body, leaving the area sore for days. These episodes occur both at rest and during physical exertion, with heavy lifting or excessive movement noted to exacerbate the rapid heartbeat and pressure sensation. The patient occasionally uses albuterol for breathing difficulties, but denies any correlation between its use and the palpitation episodes.  The patient has attempted to manage these symptoms by reducing caffeine intake, but the episodes persist. The patient has worn a heart monitor, which captured a couple of these episodes, but none of the severe ones. The patient also reports that the frequency of these dizzy spells has been increasing.   Although there is a family  history of poorly defined acquired heart problems, there is no history of congenital heart disease and no family history of cardiac arrest or unexplained early death.  Past Medical History:  Diagnosis Date   Asthma    Diagnosed age 32 or 32 yo.   Calculus of gallbladder    Chronic fatigue syndrome 2021   Endometriosis    Fibromyalgia     Past Surgical History:  Procedure Laterality Date   CHOLECYSTECTOMY N/A 09/01/2019   Procedure: LAPAROSCOPIC CHOLECYSTECTOMY;  Surgeon: Harriette Bouillon, MD;  Location: [facility] OR;  Service: General;  Laterality: N/A;   WISDOM TOOTH EXTRACTION  2011    Current Medications: Current Meds  Medication Sig   albuterol (VENTOLIN HFA) 108 (90 Base) MCG/ACT inhaler Inhale 2 puffs into the lungs every 4 (four) hours as needed for wheezing or shortness of breath.   azelastine (ASTELIN) 0.1 % nasal spray Place 2 sprays into both nostrils 2 (two) times daily. Use in each nostril as directed   diphenhydrAMINE (BENADRYL) 25 MG tablet Take 50 mg by mouth daily as needed for itching or allergies.   drospirenone-ethinyl estradiol (YAZ) 3-0.02 MG tablet Take 1 tablet by mouth at bedtime.   fluticasone (FLONASE) 50 MCG/ACT nasal spray Place 2 sprays into both nostrils daily.   fluticasone (FLOVENT HFA) 110 MCG/ACT inhaler Inhale 2 puffs into the lungs in the morning and at bedtime. Rinse mouth out after use.   gabapentin (NEURONTIN) 300 MG capsule Take 1 capsule (300 mg total) by mouth 3 (three) times daily.   montelukast (SINGULAIR) 10 MG tablet TAKE  1 TABLET(10 MG) BY MOUTH AT BEDTIME   sertraline (ZOLOFT) 100 MG tablet Take 1 tablet (100 mg total) by mouth daily.     Allergies:   Bee venom, Shellfish allergy, Fire ant, Morphine, and Codeine   Social History   Socioeconomic History   Marital status: Single    Spouse name: Not on file   Number of children: 0   Years of education: Not on file   Highest education level: Bachelor's degree (e.g., BA, AB, BS)   Occupational History   Occupation: Corporate treasurer  Tobacco Use   Smoking status: Never   Smokeless tobacco: Never  Vaping Use   Vaping status: Never Used  Substance and Sexual Activity   Alcohol use: No   Drug use: No   Sexual activity: Not Currently    Birth control/protection: Condom, Pill  Other Topics Concern   Not on file  Social History Narrative   Degree in Psychology from [employer]   LIves with her mother.   Social Determinants of Health   Financial Resource Strain: Medium Risk (06/27/2022)   Overall Financial Resource Strain (CARDIA)    Difficulty of Paying Living Expenses: Somewhat hard  Food Insecurity: Patient Declined (06/27/2022)   Hunger Vital Sign    Worried About Running Out of Food in the Last Year: Patient declined    Ran Out of Food in the Last Year: Patient declined  Recent Concern: Food Insecurity - Food Insecurity Present (06/13/2022)   Hunger Vital Sign    Worried About Running Out of Food in the Last Year: Sometimes true    Ran Out of Food in the Last Year: Sometimes true  Transportation Needs: No Transportation Needs (06/27/2022)   PRAPARE - Administrator, Civil Service (Medical): No    Lack of Transportation (Non-Medical): No  Physical Activity: Sufficiently Active (06/27/2022)   Exercise Vital Sign    Days of Exercise per Week: 4 days    Minutes of Exercise per Session: 90 min  Stress: Stress Concern Present (06/27/2022)   [employer] of Occupational Health - Occupational Stress Questionnaire    Feeling of Stress : Very much  Social Connections: Socially Isolated (06/27/2022)   Social Connection and Isolation Panel [NHANES]    Frequency of Communication with Friends and Family: Three times a week    Frequency of Social Gatherings with Friends and Family: Patient declined    Attends Religious Services: Never    Database administrator or Organizations: No    Attends Engineer, structural: Not on file    Marital Status: Never  married     Family History: The patient's family history includes Bipolar disorder in her mother; Healthy in her father and mother.  The patient has a family history of heart problems, with a grandfather who had a 'funny' heartbeat due to scarlet fever in childhood and a heavy smoking habit, and a grandmother who developed heart problems due to smoking. The patient's father has an unspecified valve problem.   ROS:   Please see the history of present illness.   Asthma well-controlled on the current maintenance medications and she rarely needs to use albuterol.  All other systems reviewed and are negative.  EKGs/Labs/Other Studies Reviewed:    The following studies were reviewed today: 04/28/2022 arrhythmia monitor.     HR 39 - 153, average 74 bpm. Predominant underlying rhythm was normal sinus. Second Degree AV Block-Mobitz I (Wenckebach) was present. There were no frequent PACs or sustained atrial  arrhythmias. No atrial fibrillation detected. Isolated PVCs were rare (<1.0%), and on one occasion correlated with patient's symptom diary, although exact timing uncertain. Oher reported symptom episodes and one triggered event occurred during sinus rhythm. Based on these findings, an automatic referral to cardiology will not be generated.   Recent Labs: 09/28/2022: ALT 9; BUN 16; Creatinine, Ser 1.12; Hemoglobin 13.6; Platelets 265.0; Potassium 4.1; Sodium 140; TSH 1.92  Recent Lipid Panel    Component Value Date/Time   CHOL 182 12/20/2021 1406   TRIG 111.0 12/20/2021 1406   HDL 75.10 12/20/2021 1406   CHOLHDL 2 12/20/2021 1406   VLDL 22.2 12/20/2021 1406   LDLCALC 85 12/20/2021 1406     Risk Assessment/Calculations:                Physical Exam:    VS:  BP 118/86 (BP Location: Left Arm, Patient Position: Sitting, Cuff Size: Large)   Pulse 74   Ht 5\' 10"  (1.778 m)   Wt 222 lb (100.7 kg)   SpO2 98%   BMI 31.85 kg/m     Wt Readings from Last 3 Encounters:  11/28/22 222 lb  (100.7 kg)  09/27/22 217 lb (98.4 kg)  06/27/22 218 lb 4 oz (99 kg)     GEN: Mildly obese,  Well nourished, well developed in no acute distress HEENT: Normal NECK: No JVD; No carotid bruits LYMPHATICS: No lymphadenopathy CARDIAC: RRR, no murmurs, rubs, gallops RESPIRATORY:  Clear to auscultation without rales, wheezing or rhonchi  ABDOMEN: Soft, non-tender, non-distended MUSCULOSKELETAL:  No edema; No deformity  SKIN: Warm and dry NEUROLOGIC:  Alert and oriented x 3 PSYCHIATRIC:  Normal affect   ASSESSMENT:    1. Palpitations   2. Fatigue, unspecified type    PLAN:    In order of problems listed above:  Palpitations with Premature Atrial and Ventricular Contractions (PACs and PVCs) Reports palpitations with rapid heartbeat, pause, and strong thud, accompanied by dizziness and chest pain. Episodes have increased in frequency and intensity. Holter monitor results from September showed occasional PACs and PVCs. Explained these are common and usually benign. Discussed the potential impact of stress and albuterol on symptoms. Recommended further evaluation to rule out structural heart issues.   -  Discussed options for home monitoring devices, including Kardia and smartwatches, and their costs.   - She would like a medication to help suppress the symptoms since they appear to be increasing in frequency.  Will try metoprolol succinate 25 mg once daily.  Advised that is unlikely, but possible that this will increase her frequency of asthma problems..  -  Order echocardiogram  - Educated on the potential impact of stress and albuterol on symptoms. Discussed stress management techniques.           Medication Adjustments/Labs and Tests Ordered: Current medicines are reviewed at length with the patient today.  Concerns regarding medicines are outlined above.  Orders Placed This Encounter  Procedures   EKG 12-Lead   No orders of the defined types were placed in this  encounter.   There are no Patient Instructions on file for this visit.   Signed, Thurmon Fair, MD  11/28/2022 10:44 AM    [facility]

## 2022-11-28 NOTE — Patient Instructions (Addendum)
Medication Instructions:  Metoprolol Succinate 25 mg every night *If you need a refill on your cardiac medications before your next appointment, please call your pharmacy*  Testing/Procedures: Your physician has requested that you have an echocardiogram. Echocardiography is a painless test that uses sound waves to create images of your heart. It provides your doctor with information about the size and shape of your heart and how well your heart's chambers and valves are working. This procedure takes approximately one hour. There are no restrictions for this procedure. Please do NOT wear cologne, perfume, aftershave, or lotions (deodorant is allowed). Please arrive 15 minutes prior to your appointment time.  Please note: We ask at that you not bring children with you during ultrasound (echo/ vascular) testing. Due to room size and safety concerns, children are not allowed in the ultrasound rooms during exams. Our front office staff cannot provide observation of children in our lobby area while testing is being conducted. An adult accompanying a patient to their appointment will only be allowed in the ultrasound room at the discretion of the ultrasound technician under special circumstances. We apologize for any inconvenience.   You can look into the [facility] device by [employer]. This device is purchased by you and it connects to an application you download to your smart phone.  It can detect abnormal heart rhythms and alert you to contact your doctor for further evaluation. The web site is:  https://www.alivecor.com     Follow-Up: At [facility], you and your health needs are our priority.  As part of our continuing mission to provide you with exceptional heart care, we have created designated Provider Care Teams.  These Care Teams include your primary Cardiologist (physician) and Advanced Practice Providers (APPs -  Physician Assistants and Nurse Practitioners) who all work together  to provide you with the care you need, when you need it.  We recommend signing up for the patient portal called "MyChart".  Sign up information is provided on this After Visit Summary.  MyChart is used to connect with patients for Virtual Visits (Telemedicine).  Patients are able to view lab/test results, encounter notes, upcoming appointments, etc.  Non-urgent messages can be sent to your provider as well.   To learn more about what you can do with MyChart, go to [email or web address].    Your next appointment:   1 year(s)  Provider:   Dr Royann Shivers

## 2022-11-29 ENCOUNTER — Ambulatory Visit (HOSPITAL_COMMUNITY): Payer: Commercial Managed Care - HMO | Attending: Cardiovascular Disease

## 2022-11-29 ENCOUNTER — Other Ambulatory Visit (HOSPITAL_COMMUNITY): Payer: Managed Care, Other (non HMO)

## 2022-11-29 DIAGNOSIS — R002 Palpitations: Secondary | ICD-10-CM | POA: Diagnosis present

## 2022-11-29 DIAGNOSIS — R079 Chest pain, unspecified: Secondary | ICD-10-CM | POA: Diagnosis present

## 2022-11-29 LAB — ECHOCARDIOGRAM COMPLETE
Area-P 1/2: 4.06 cm2
S' Lateral: 3.3 cm

## 2022-12-12 ENCOUNTER — Ambulatory Visit: Payer: Commercial Managed Care - HMO | Admitting: Cardiovascular Disease

## 2023-02-22 ENCOUNTER — Encounter: Payer: Self-pay | Admitting: Cardiovascular Disease

## 2023-02-22 NOTE — Telephone Encounter (Signed)
I'm back in the office Monday and can take care of it then. Thanks

## 2023-02-25 ENCOUNTER — Telehealth: Payer: Self-pay

## 2023-02-25 NOTE — Telephone Encounter (Signed)
Called and left VMM that the letter is completed and being mailed to her.

## 2023-04-01 ENCOUNTER — Telehealth: Admitting: Family Medicine

## 2023-04-01 DIAGNOSIS — H9209 Otalgia, unspecified ear: Secondary | ICD-10-CM

## 2023-04-01 NOTE — Progress Notes (Signed)
 Because you might have a ruptured ear drum we need to have you seen in person for someone to look into your ear. So that the best treatment option is provided.    NOTE: There will be NO CHARGE for this E-Visit   If you are having a true medical emergency, please call 911.

## 2023-04-03 ENCOUNTER — Telehealth: Admitting: Physician Assistant

## 2023-04-03 DIAGNOSIS — H66003 Acute suppurative otitis media without spontaneous rupture of ear drum, bilateral: Secondary | ICD-10-CM | POA: Diagnosis not present

## 2023-04-04 MED ORDER — AMOXICILLIN 875 MG PO TABS
875.0000 mg | ORAL_TABLET | Freq: Two times a day (BID) | ORAL | 0 refills | Status: AC
Start: 2023-04-04 — End: 2023-04-14

## 2023-04-04 NOTE — Progress Notes (Signed)
 E-Visit for Ear Pain - Acute Otitis Media   We are sorry that you are not feeling well. Here is how we plan to help!  Based on what you have shared with me it looks like you have Acute Otitis Media.  Acute Otitis Media is an infection of the middle or "inner" ear. This type of infection can cause redness, inflammation, and fluid buildup behind the tympanic membrane (ear drum).  The usual symptoms include: Earache/Pain Fever Upper respiratory symptoms Lack of energy/Fatigue/Malaise Slight hearing loss gradually worsening- if the inner ear fills with fluid What causes middle ear infections? Most middle ear infections occur when an infection such as a cold, leads to a build-up of mucus in the middle ear and causes the Eustachian tube (a thin tube that runs from the middle ear to the back of the nose) to become swollen or blocked.   This means mucus can't drain away properly, making it easier for an infection to spread into the middle ear.  How middle ear infections are treated: Most ear infections clear up within three to five days and don't need any specific treatment. If necessary, tylenol or ibuprofen should be used to relieve pain and a high temperature.  If you develop a fever higher than 102, or any significantly worsening symptoms, this could indicate a more serious infection moving to the middle/inner and needs face to face evaluation in an office by a provider.   Antibiotics aren't routinely used to treat middle ear infections, although they may occasionally be prescribed if symptoms persist or are particularly severe. Given your presentation,   I have prescribed Amoxicillin 875 mg one tablet twice daily for 10 days   Your symptoms should improve over the next 3 days and should resolve in about 7 days. Be sure to complete ALL of the prescription(s) given.  HOME CARE: Wash your hands frequently. If you are prescribed an ear drop, do not place the tip of the bottle on your ear or  touch it with your fingers. You can take Acetaminophen 650 mg every 4-6 hours as needed for pain.  If pain is severe or moderate, you can apply a heating pad (set on low) or hot water bottle (wrapped in a towel) to outer ear for 20 minutes.  This will also increase drainage.  GET HELP RIGHT AWAY IF: Fever is over 102.2 degrees. You develop progressive ear pain or hearing loss. Ear symptoms persist longer than 3 days after treatment.  MAKE SURE YOU: Understand these instructions. Will watch your condition. Will get help right away if you are not doing well or get worse.  Thank you for choosing an e-visit.  Your e-visit answers were reviewed by a board certified advanced clinical practitioner to complete your personal care plan. Depending upon the condition, your plan could have included both over the counter or prescription medications.  Please review your pharmacy choice. Make sure the pharmacy is open so you can pick up the prescription now. If there is a problem, you may contact your provider through [employer] and have the prescription routed to another pharmacy.  Your safety is important to [place]. If you have drug allergies check your prescription carefully.   For the next 24 hours you can use MyChart to ask questions about today's visit, request a non-urgent call back, or ask for a work or school excuse. You will get an email with a survey after your eVisit asking about your experience. We would appreciate your feedback. I hope  that your e-visit has been valuable and will aid in your recovery.

## 2023-04-04 NOTE — Progress Notes (Signed)
 I have spent 5 minutes in review of e-visit questionnaire, review and updating patient chart, medical decision making and response to patient.   Piedad Climes, PA-C

## 2023-04-09 ENCOUNTER — Other Ambulatory Visit: Payer: Self-pay | Admitting: Family Medicine

## 2023-04-09 DIAGNOSIS — Z3040 Encounter for surveillance of contraceptives, unspecified: Secondary | ICD-10-CM

## 2023-04-20 ENCOUNTER — Other Ambulatory Visit: Payer: Self-pay

## 2023-04-20 ENCOUNTER — Encounter (HOSPITAL_COMMUNITY): Payer: Self-pay

## 2023-04-20 ENCOUNTER — Emergency Department (HOSPITAL_COMMUNITY)
Admission: EM | Admit: 2023-04-20 | Discharge: 2023-04-20 | Disposition: A | Discharge status: Home / Self Care | Attending: Emergency Medicine | Admitting: Emergency Medicine

## 2023-04-20 DIAGNOSIS — T782XXA Anaphylactic shock, unspecified, initial encounter: Secondary | ICD-10-CM | POA: Insufficient documentation

## 2023-04-20 DIAGNOSIS — J45909 Unspecified asthma, uncomplicated: Secondary | ICD-10-CM | POA: Diagnosis not present

## 2023-04-20 DIAGNOSIS — T7840XA Allergy, unspecified, initial encounter: Secondary | ICD-10-CM | POA: Diagnosis present

## 2023-04-20 MED ORDER — EPINEPHRINE 0.3 MG/0.3ML IJ SOAJ: 0.3000 mg | INTRAMUSCULAR | 0 refills | Status: AC | PRN

## 2023-04-20 NOTE — ED Notes (Signed)
 Patient ambulated independently to the restroom.

## 2023-04-20 NOTE — ED Provider Notes (Signed)
 Emergency Department Provider Note   I have reviewed the triage vital signs and the nursing notes.   HISTORY  Chief Complaint Allergic Reaction   HPI Carol Marquez is a 33 y.o. female with past history reviewed below presents emergency department for evaluation of acute onset lip swelling, throat tightness, itchy rash.  This occurred 20 minutes after taking amoxicillin for ear pain and presumed ear infection.  She states she took the dose recently with some itchy rash to the face but nothing severe.  She had forgotten about the medication and missed several doses but took it again this evening and 20 minutes later developed swelling to the lips with tongue itching and throat tightness.  EMS arrived on scene to find significant face swelling.  Patient had already taken 75 mg of Benadryl.  EMS noted hives on scene.  She was given albuterol neb, epinephrine IM, Solu-Medrol.  Swelling significantly improved.  EMS appreciated wheezing prior to neb treatment but that also resolved.  Patient denies any chest pain, shortness of breath, GI symptoms.   Past Medical History:  Diagnosis Date   Asthma    Diagnosed age 32 or 33 yo.   Calculus of gallbladder    Chronic fatigue syndrome 2021   Endometriosis    Fibromyalgia     Review of Systems  Constitutional: No fever/chills Cardiovascular: Denies chest pain. Respiratory: Denies shortness of breath. Gastrointestinal: No abdominal pain.  Skin: Negative for rash. Neurological: Negative for headaches.  ____________________________________________   PHYSICAL EXAM:  VITAL SIGNS: ED Triage Vitals  Encounter Vitals Group     BP 04/20/23 0015 (!) 148/84     Pulse Rate 04/20/23 0015 (!) 105     Resp 04/20/23 0015 16     Temp 04/20/23 0009 98 F (36.7 C)     Temp Source 04/20/23 0009 Oral     SpO2 04/20/23 0015 100 %     Weight 04/20/23 0013 218 lb (98.9 kg)     Height 04/20/23 0013 5\' 10"  (1.778 m)   Constitutional: Alert and  oriented. Well appearing and in no acute distress. Eyes: Conjunctivae are normal.  Head: Atraumatic. Nose: No congestion/rhinnorhea. Mouth/Throat: Mucous membranes are moist. No angioedema. Airway is widely patent.  Neck: No stridor.   Cardiovascular: Normal rate, regular rhythm. Good peripheral circulation. Grossly normal heart sounds.   Respiratory: Normal respiratory effort.  No retractions. Lungs CTAB. Gastrointestinal: Soft and nontender. No distention.  Musculoskeletal: No lower extremity tenderness nor edema. No gross deformities of extremities. Neurologic:  Normal speech and language.  Skin:  Skin is warm, dry and intact. No rash.   ________________________   PROCEDURES  Procedure(s) performed:   Procedures  CRITICAL CARE Performed by: Roberts Ching Total critical care time: 35 minutes Critical care time was exclusive of separately billable procedures and treating other patients. Critical care was necessary to treat or prevent imminent or life-threatening deterioration. Critical care was time spent personally by me on the following activities: development of treatment plan with patient and/or surrogate as well as nursing, discussions with consultants, evaluation of patient's response to treatment, examination of patient, obtaining history from patient or surrogate, ordering and performing treatments and interventions, ordering and review of laboratory studies, ordering and review of radiographic studies, pulse oximetry and re-evaluation of patient's condition.  Abby Hocking, MD Emergency Medicine  ____________________________________________   INITIAL IMPRESSION / ASSESSMENT AND PLAN / ED COURSE  Pertinent labs & imaging results that were available during my care of the patient  were reviewed by me and considered in my medical decision making (see chart for details).   This patient is Presenting for Evaluation of mouth swelling, which does require a range of treatment  options, and is a complaint that involves a high risk of morbidity and mortality.  The Differential Diagnoses include anaphylaxis, angioedema, hereditary angioedema, etc.  I did obtain Additional Historical Information from EMS.  Cardiac Monitor Tracing which shows NSR.    Social Determinants of Health Risk patient is a non-smoker.   Medical Decision Making: Summary:  Patient presents to the emergency department with anaphylactic reaction in the field treated by EMS prior to arrival.  Patient received epinephrine, steroid, Benadryl prior to arrival.  Currently no GU edema.  No GI symptoms.  Patient appears well.  No evidence of ear infection on visualization of the TMs bilaterally.  Would not start additional antibiotic.  Have listed amoxicillin as an allergy for her in the chart.  Reevaluation with update and discussion with patient. Feeling well after monitoring in the ED. No return of symptoms. Plan for d/c with EpiPen and PCP follow up. Will discontinue abx. Have listed this as an allergy as well.   Considered admission but no return of anaphylaxis symptoms.   Patient's presentation is most consistent with acute presentation with potential threat to life or bodily function.   Disposition: discharge  ____________________________________________  FINAL CLINICAL IMPRESSION(S) / ED DIAGNOSES  Final diagnoses:  Anaphylaxis, initial encounter     NEW OUTPATIENT MEDICATIONS STARTED DURING THIS VISIT:  Discharge Medication List as of 04/20/2023  4:13 AM     START taking these medications   Details  EPINEPHrine 0.3 mg/0.3 mL IJ SOAJ injection Inject 0.3 mg into the muscle as needed for anaphylaxis., Starting Sat 04/20/2023, Normal        Note:  This document was prepared using Dragon voice recognition software and may include unintentional dictation errors.  Abby Hocking, MD, [facility] Emergency Medicine    Eleazar Kimmey, Shereen Dike, MD 04/21/23 [phone]

## 2023-04-20 NOTE — ED Notes (Signed)
 Called CCMD to have patient monitored.

## 2023-04-20 NOTE — ED Triage Notes (Signed)
 Patient coming in with an allergic reaction from what is believed to be amoxicillin which was prescribed for an ear infection. Patient initially had itching and took 75mg  of benadryl. EMS noted mouth swelling, hives, nausea, and wheezing on arrival to scene. Patient was given 0.3 epi IM and albuterol nebulizer. Wheezing was still present and patient was given a duo neb and 125mg  of solumedrol per EMS. EMS noted swelling has gone down. EMS VS 148/70 BP 110 HR 99% on nebulizer 80 CBG

## 2023-04-20 NOTE — Discharge Instructions (Signed)
You have been seen in the Emergency Department (ED) today for an allergic reaction.  You have been stable throughout your stay in the Emergency Department.  Please take your medications as prescribed and follow up with your doctor as indicated.  You should also take over-the-counter Benadryl around the clock for the next three days according to the dosing instructions on the package.  Please keep your Epi-Pen with you at all times and use it if experience shortness of breath or difficulty breathing or if you believe you are having a severe allergic reaction.  If you use the Epi-Pen, though, please call 911 afterwards or go immediately to your nearest Emergency Department.  Return to the Emergency Department (ED) if you experience any worsening or new symptoms that concern you.  

## 2023-04-29 ENCOUNTER — Ambulatory Visit: Payer: Self-pay

## 2023-04-29 NOTE — Telephone Encounter (Signed)
 Chief Complaint: Abdominal pain  Symptoms: see notes Frequency: Intermittent for 8 months Pertinent Negatives: Patient denies pregnancy Disposition: [] ED /[] Urgent Care (no appt availability in office) / [x] Appointment(In office/virtual)/ []  [place] [facility]/ [] Home Care/ [] Refused Recommended Disposition /[] [facility] [place] [facility]/ []  Follow-up with PCP Additional Notes: Patient called stating she has been experiencing certain symptoms for roughly 8 months around the time of her menstrual cycle, setting in roughly 5 days before the onset of her menstrual cycle and lasting about 7-10 days. Patient describes symptoms as extreme bloating, RUQ pain (patient has gallbladder removed), nausea, sometimes sweating because of the pain. Patient states she has been diagnosed with endometriosis and she feels that it may be spreading and causing effects in that area. Patient states she is currently treating endometriosis with birth control pills. Patient appt made for further evaluation.    Copied from CRM [id number]. Topic: Clinical - Red Word Triage >> Apr 29, 2023  1:45 PM Luane Rumps D wrote: Red Word that prompted transfer to Nurse Triage: Excessive pain/nausea/pain in stomach. Primarily where gallbladder used to be. Past 8 months around period. Reason for Disposition  Abdominal pain is a chronic symptom (recurrent or ongoing AND present > 4 weeks)  Answer Assessment - Initial Assessment Questions 1. LOCATION: "Where does it hurt?"      Right upper quadrant 2. RADIATION: "Does the pain shoot anywhere else?" (e.g., chest, back)     Shoots into ribs and back 3. ONSET: "When did the pain begin?" (e.g., minutes, hours or days ago)      8 months 4. SUDDEN: "Gradual or sudden onset?"     Gradual 5. PATTERN "Does the pain come and go, or is it constant?"    - If it comes and goes: "How long does it last?" "Do you have pain now?"     (Note: Comes and goes means the pain is intermittent. It goes away  completely between bouts.)    - If constant: "Is it getting better, staying the same, or getting worse?"      (Note: Constant means the pain never goes away completely; most serious pain is constant and gets worse.)      Comes and goes  6. SEVERITY: "How bad is the pain?"  (e.g., Scale 1-10; mild, moderate, or severe)    - MILD (1-3): Doesn't interfere with normal activities, abdomen soft and not tender to touch..     - MODERATE (4-7): Interferes with normal activities or awakens from sleep, abdomen tender to touch.     - SEVERE (8-10): Excruciating pain, doubled over, unable to do any normal activities.       Varies 7. RECURRENT SYMPTOM: "Have you ever had this type of stomach pain before?" If Yes, ask: "When was the last time?" and "What happened that time?"      See notes 8. AGGRAVATING FACTORS: "Does anything seem to cause this pain?" (e.g., foods, stress, alcohol)     Menstrual cycle 9. CARDIAC SYMPTOMS: "Do you have any of the following symptoms: chest pain, difficulty breathing, sweating, nausea?"     Sweating and nausea when this occurs  10. OTHER SYMPTOMS: "Do you have any other symptoms?" (e.g., back pain, diarrhea, fever, urination pain, vomiting)       Nausea, dizziness, pain, bloating 11. PREGNANCY: "Is there any chance you are pregnant?" "When was your last menstrual period?"       No  Protocols used: Abdominal Pain - Upper-A-AH

## 2023-05-06 ENCOUNTER — Ambulatory Visit (INDEPENDENT_AMBULATORY_CARE_PROVIDER_SITE_OTHER): Admitting: Family Medicine

## 2023-05-06 ENCOUNTER — Encounter: Payer: Self-pay | Admitting: Family Medicine

## 2023-05-06 VITALS — BP 126/74 | HR 74 | Temp 98.0°F | Resp 16 | Ht 70.0 in | Wt 245.0 lb

## 2023-05-06 DIAGNOSIS — G8929 Other chronic pain: Secondary | ICD-10-CM | POA: Diagnosis not present

## 2023-05-06 DIAGNOSIS — R109 Unspecified abdominal pain: Secondary | ICD-10-CM | POA: Diagnosis not present

## 2023-05-06 DIAGNOSIS — H9202 Otalgia, left ear: Secondary | ICD-10-CM | POA: Diagnosis not present

## 2023-05-06 MED ORDER — PREDNISONE 20 MG PO TABS
40.0000 mg | ORAL_TABLET | Freq: Every day | ORAL | 0 refills | Status: AC
Start: 2023-05-06 — End: 2023-05-11

## 2023-05-06 MED ORDER — ONDANSETRON 4 MG PO TBDP: 4.0000 mg | ORAL_TABLET | Freq: Three times a day (TID) | ORAL | 0 refills | Status: AC | PRN

## 2023-05-06 MED ORDER — DICYCLOMINE HCL 10 MG PO CAPS: ORAL_CAPSULE | ORAL | 0 refills | Status: DC

## 2023-05-06 NOTE — Patient Instructions (Signed)
 If you do not hear anything about your referral in the next 1-2 weeks, call our office and ask for an update.  Reach out to your GYN team regarding this issue to see if they want to evaluate you.  Let us  know if you need anything.

## 2023-05-06 NOTE — Progress Notes (Signed)
 Chief Complaint  Patient presents with   Abdominal Pain    Abdominal Pain    TROI RIRIE is here for abdominal pain.  Duration: 8 months- steadily worsened and then plateaued RUQ pain, sometimes radiating in all directions at some point.  Stabbing.  Nighttime awakenings? Yes Bleeding? No Weight loss? No Palliation: none Provocation: on her period No affected by bowel movements or urination or eating.  Associated symptoms: nausea and vomiting Denies: fever and bowel changes Treatment to date: ice, Midol , Dramamine, Ibuprofen  Of note, she does not have a gallbladder.  She does have a history of endometriosis and had active evidence of this when she had her gallbladder removed.  Left ear fullness since being treated for an ear infection.  No drainage or fevers.  Past Medical History:  Diagnosis Date   Asthma    Diagnosed age 33 or 33 yo.   Calculus of gallbladder    Chronic fatigue syndrome 2021   Endometriosis    Fibromyalgia     BP 126/74 (BP Location: Left Arm, Patient Position: Sitting)   Pulse 74   Temp 98 F (36.7 C) (Oral)   Resp 16   Ht 5\' 10"  (1.778 m)   Wt 245 lb (111.1 kg)   SpO2 100%   BMI 35.15 kg/m  Gen.: Awake, alert, appears stated age HEENT: Mucous membranes moist without mucosal lesions.  TMs negative bilaterally, no fluid or erythema.  Canals are patent bilaterally. Heart: Regular rate and rhythm without murmurs Lungs: Clear auscultation bilaterally, no rales or wheezing, normal effort without accessory muscle use. Abdomen: Bowel sounds are present. Abdomen is soft, TTP in the epigastric region and right upper quadrant, nondistended, no masses or organomegaly. Negative Murphy's, Rovsing's, McBurney's, and Carnett's sign. Psych: Age appropriate judgment and insight. Normal mood and affect.  Chronic abdominal pain - Plan: Ambulatory referral to Gastroenterology, dicyclomine (BENTYL) 10 MG capsule, ondansetron  (ZOFRAN -ODT) 4 MG disintegrating  tablet  Left ear pain - Plan: predniSONE  (DELTASONE ) 20 MG tablet  Chronic not controlled.  Refer to GI.  Zofran  and Bentyl as needed.  She will reach out to her gynecology team to see if they are concerned for possible endometriosis in this region.  Does not sound like it is related to reflux.  Functional dyspepsia is on the differential. 5-day prednisone  burst 40 mg daily. Pt voiced understanding and agreement to the plan.  Shellie Dials [place], DO 05/06/23 2:08 PM

## 2023-05-31 ENCOUNTER — Other Ambulatory Visit: Payer: Self-pay | Admitting: Family Medicine

## 2023-05-31 DIAGNOSIS — M797 Fibromyalgia: Secondary | ICD-10-CM

## 2023-06-08 ENCOUNTER — Telehealth

## 2023-06-08 DIAGNOSIS — N83209 Unspecified ovarian cyst, unspecified side: Secondary | ICD-10-CM

## 2023-06-08 DIAGNOSIS — N809 Endometriosis, unspecified: Secondary | ICD-10-CM | POA: Diagnosis not present

## 2023-06-09 NOTE — Progress Notes (Signed)
 E-Visit for Vaginal Symptoms  We are sorry that you are not feeling well. Here is how we plan to help! Based on what you shared with me it looks like you:  ovarian cyst. However, I will say most of the time with a cyst rupture, you do not have a fever. If you continue to have a fever and pain you need to be seen face to face today.   I have sent a work note to Pharmacologist. It will be under letters. I hope you feel better.   HOME CARE:  Good hygiene may prevent some types of vaginosis from recurring and may relieve some symptoms:  Avoid baths, hot tubs and whirlpool spas. Rinse soap from your outer genital area after a shower, and dry the area well to prevent irritation. Don't use scented or harsh soaps, such as those with deodorant or antibacterial action. Avoid irritants. These include scented tampons and pads. Wipe from front to back after using the toilet. Doing so avoids spreading fecal bacteria to your vagina.  Other things that may help prevent vaginosis include:  Don't douche. Your vagina doesn't require cleansing other than normal bathing. Repetitive douching disrupts the normal organisms that reside in the vagina and can actually increase your risk of vaginal infection. Douching won't clear up a vaginal infection. Use a latex condom. Both female and female latex condoms may help you avoid infections spread by sexual contact. Wear cotton underwear. Also wear pantyhose with a cotton crotch. If you feel comfortable without it, skip wearing underwear to bed. Yeast thrives in [employer] Your symptoms should improve in the next day or two.  GET HELP RIGHT AWAY IF:  You have pain in your lower abdomen ( pelvic area or over your ovaries) You develop nausea or vomiting You develop a fever Your discharge changes or worsens You have persistent pain with intercourse You develop shortness of breath, a rapid pulse, or you faint.  These symptoms could be signs of problems or  infections that need to be evaluated by a medical provider now.  MAKE SURE YOU   Understand these instructions. Will watch your condition. Will get help right away if you are not doing well or get worse.  Thank you for choosing an e-visit.  Your e-visit answers were reviewed by a board certified advanced clinical practitioner to complete your personal care plan. Depending upon the condition, your plan could have included both over the counter or prescription medications.  Please review your pharmacy choice. Make sure the pharmacy is open so you can pick up prescription now. If there is a problem, you may contact your provider through [employer] and have the prescription routed to another pharmacy.  Your safety is important to us . If you have drug allergies check your prescription carefully.   For the next 24 hours you can use MyChart to ask questions about today's visit, request a non-urgent call back, or ask for a work or school excuse. You will get an email in the next two days asking about your experience. I hope that your e-visit has been valuable and will speed your recovery.  Approximately 5 minutes was spent documenting and reviewing patient's chart.

## 2023-06-13 ENCOUNTER — Ambulatory Visit: Admitting: Obstetrics and Gynecology

## 2023-06-13 ENCOUNTER — Encounter: Payer: Self-pay | Admitting: Obstetrics and Gynecology

## 2023-06-13 ENCOUNTER — Other Ambulatory Visit: Payer: Self-pay

## 2023-06-13 VITALS — BP 121/83 | HR 71 | Ht 70.0 in | Wt 248.3 lb

## 2023-06-13 DIAGNOSIS — R300 Dysuria: Secondary | ICD-10-CM | POA: Diagnosis not present

## 2023-06-13 DIAGNOSIS — Z3009 Encounter for other general counseling and advice on contraception: Secondary | ICD-10-CM

## 2023-06-13 DIAGNOSIS — N809 Endometriosis, unspecified: Secondary | ICD-10-CM

## 2023-06-13 LAB — POCT URINALYSIS DIP (DEVICE)
Bilirubin Urine: NEGATIVE
Glucose, UA: NEGATIVE mg/dL
Ketones, ur: NEGATIVE mg/dL
Leukocytes,Ua: NEGATIVE
Nitrite: NEGATIVE
Protein, ur: NEGATIVE mg/dL
Specific Gravity, Urine: 1.025 (ref 1.005–1.030)
Urobilinogen, UA: 0.2 mg/dL (ref 0.0–1.0)
pH: 6.5 (ref 5.0–8.0)

## 2023-06-13 MED ORDER — DROSPIRENONE-ETHINYL ESTRADIOL 3-0.03 MG PO TABS: 1.0000 | ORAL_TABLET | Freq: Every day | ORAL | 11 refills | Status: AC

## 2023-06-13 NOTE — Progress Notes (Signed)
 GYNECOLOGY VISIT  Patient name: Carol Marquez MRN [id number]  Date of birth: 1990-07-01 Chief Complaint:   Gynecologic Exam   History:  Carol Marquez is a 33 y.o. G0P0000 being seen today for pelvic pain.  Concerned for spread of endometriosis. Menses are painful and when she is on her menses she has difficulty with urination and concerned about bladder involvement. Reports pain where gallbladder used to be.  Discussed the use of AI scribe software for clinical note transcription with the patient, who gave verbal consent to proceed.  History of Present Illness Carol Marquez is a 33 year old female with endometriosis who presents with abdominal pain and menstrual irregularities.  She experiences abdominal pain in the area where her gallbladder was removed, described as stabbing and burning, similar to her endometriosis pain. This pain occurs near her menstrual period and resolves afterward, with associated bloating and nausea, and is more intense than typical menstrual cramps. -  She has been taking birth control pills continuously without the placebo pills for a few years, specifically the pink ones, and continues to have a monthly cycle despite skipping the nonhormonal pills. She has not used any other medications for her periods and has not experienced improvement in her premenstrual dysphoric disorder (PMDD) symptoms with the current birth control.  She experiences urinary discomfort, particularly the need to urinate more frequently and urgently, especially when lying down and then standing up, occurring about seven days before her period. This resolves once her period starts. Her urine is darker but without visible blood.  She has a history of gallbladder removal and mentions that the pain she currently experiences is different from the pain that led to her gallbladder surgery. 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  She  has a family history of endometriosis and has previously undergone an ultrasound at age 42, which did not reveal any abnormalities despite her symptoms. She experiences large clots during her menstrual cycle, which she refers to as 'jellyfish'. She has not had an endometrial biopsy before.     Past Medical History:  Diagnosis Date   Asthma    Diagnosed age 64 or 33 yo.   Calculus of gallbladder    Chronic fatigue syndrome 2021   Endometriosis    Fibromyalgia     Past Surgical History:  Procedure Laterality Date   CHOLECYSTECTOMY N/A 09/01/2019   Procedure: LAPAROSCOPIC CHOLECYSTECTOMY;  Surgeon: Sim Dryer, MD;  Location: [facility] OR;  Service: General;  Laterality: N/A;   WISDOM TOOTH EXTRACTION  2011    The following portions of the patient's history were reviewed and updated as appropriate: allergies, current medications, past family history, past medical history, past social history, past surgical history and problem list.   Health Maintenance:   Last pap     Component Value Date/Time   DIAGPAP  06/13/2022 1550    - Negative for intraepithelial lesion or malignancy (NILM)   DIAGPAP  09/24/2018 1035    - Negative for intraepithelial lesion or malignancy (NILM)   HPVHIGH Negative 06/13/2022 1550   ADEQPAP  06/13/2022 1550    Satisfactory for evaluation; transformation zone component PRESENT.   ADEQPAP  09/24/2018 1035    Satisfactory for evaluation; transformation zone component PRESENT.    Last mammogram: n/a   Review of Systems:  Pertinent items are noted in HPI. Comprehensive review of systems was otherwise negative.   Objective:  Physical Exam BP 121/83  Pulse 71   Ht 5' 10 (1.778 m)   Wt 248 lb 5 oz (112.6 kg)   LMP 06/07/2023 (Approximate)   BMI 35.63 kg/m    Physical Exam     Assessment & Plan:    Assessment & Plan Endometriosis Experiences pain near previous cholecystectomy site, bloating, and nausea near menstruation. On continuous birth  control with suboptimal ovulation suppression. Differential includes endometriosis and post-cholecystectomy syndrome. Family history of endometriosis. Previous ultrasound normal. - Switch to birth control pill with higher estrogen to improve ovulation suppression and manage symptoms. - Order CT scan to evaluate upper abdomen, prior gallbladder site, abdominal wall, and ureter. - Consider endometrial biopsy if imaging suggests uterine abnormalities.  Premenstrual Dysphoric Disorder (PMDD) No improvement in PMDD symptoms with current birth control. Symptoms include significant dysuria and intense menstrual pain. Discussed potential benefit of higher estrogen pill. - Switch to birth control pill with higher estrogen to address PMDD symptoms.  Narrow Ureter and Difficulty with urinating Narrow ureter identified post-kidney infection. Symptoms include increased urinary frequency pre-menstruation, possibly related to ureteral anatomy. CT scan can assess ureteral anatomy and function. - Order CT scan to assess ureteral anatomy and function. - Urinalysis completed today   Follow-up Follow-up required to evaluate response to new birth control regimen and imaging results. - Follow up after CT scan results to discuss findings and further management.   Routine preventative health maintenance measures emphasized.  Kiki Pelton, MD [facility] for [facility], [facility]

## 2023-06-26 ENCOUNTER — Ambulatory Visit (HOSPITAL_COMMUNITY)

## 2023-06-27 ENCOUNTER — Ambulatory Visit
Admission: RE | Admit: 2023-06-27 | Discharge: 2023-06-27 | Disposition: A | Discharge status: Home / Self Care | Source: Ambulatory Visit | Attending: Obstetrics and Gynecology | Admitting: Obstetrics and Gynecology

## 2023-06-27 DIAGNOSIS — N809 Endometriosis, unspecified: Secondary | ICD-10-CM

## 2023-06-27 MED ORDER — IOPAMIDOL (ISOVUE-300) INJECTION 61%
100.0000 mL | Freq: Once | INTRAVENOUS | Status: AC | PRN
  Administered 2023-06-27: 100 mL via INTRAVENOUS

## 2023-07-02 ENCOUNTER — Ambulatory Visit: Payer: Self-pay | Admitting: Obstetrics and Gynecology

## 2023-07-02 NOTE — Telephone Encounter (Signed)
 Copied from CRM [id number]. Topic: Clinical - Medical Advice >> Jul 02, 2023  3:04 PM Robinson H wrote: Reason for CRM: Patient states that her Gyne doctor sent her for a CT and was determined she has an umbilical hernia and being sent to a Gastro doctor, states she was told by [facility] doctor that it could be fixed with birth control and patient wants to speak with provider to find out if that's true or other steps need to be taken.  [place] [phone]

## 2023-07-03 NOTE — Telephone Encounter (Signed)
 OK to sched visit w me to go over results. Thx.

## 2023-07-03 NOTE — Progress Notes (Signed)
 Carol Marquez [id number] February 09, 1990   Chief Complaint: Abdominal pain  Referring Provider: Frann Mabel Mt* Primary GI MD: Sampson  HPI: Carol Marquez is a 33 y.o. female with past medical history of asthma, cholelithiasis s/p cholecystectomy 2021, endometriosis, fibromyalgia, chronic fatigue syndrome who presents today for a complaint of abdominal pain.    Labs 09/28/2022: Normal CBC, normal TSH normal iron and ferritin but elevated TIBC, normal CMP, normal folate, low vitamin B12 170  05/06/2023 patient seen by PCP for abdominal pain.  Pain ongoing for 8 months, stabbing in nature and localized to right upper quadrant with occasional radiation in all directions.  Has been waking her up at night.  Worse during her period.  She was prescribed Zofran  and Bentyl  as needed and referred to us  for further evaluation.  Also discussed with gynecology team to see if possibly she has endometriosis in this region.  Patient seen by OB/GYN 06/13/2023 for pelvic pain, painful menses, RUQ pain, and concern for endometriosis.  RUQ pain noted to occur near her menstrual period and resolves afterwards with associated bloating and nausea, more intense than typical menstrual cramps.  Pain noted to feel different than pain she felt prior to cholecystectomy.  She was started on a different birth control pill with higher estrogen to help manage symptoms.    CT A/P 06/27/2023 showed colonic stool burden compatible with constipation.   Today patient states she started having abdominal pain in September.  Was having RUQ abdominal pain, and some nausea, but now feels that pain is more so located to her RLQ and is severe with palpation.  At first pain was intermittent, but now she feels RLQ pain constantly and reports associated bloating and nausea.  Was previously having some vomiting as well, not having this as much anymore.  She denies acid reflux but does have occasional heartburn for which she takes  Tums or drinks milk.  States that now she is having less heartburn but does have nausea constantly.  She has a bowel movement a few times a day and though the frequency of her bowel movements has not changed, she has noticed more difficulty in passing stools, and has been having hard stools and straining.  She reports history of constipation in the past, but after her cholecystectomy her stools were softer until around September when she started having more problems with constipation.  She intermittently sees bright red blood on the toilet paper.  Reports she has a hemorrhoid which comes and goes.  She declines rectal exam in office today.  Her stool is dark if she takes Pepto-Bismol, otherwise is normal in color.  She reports history of endometriosis and fibromyalgia.  States she is very familiar with the pain associated with these conditions but feels that her abdominal pain is different from what she typically experiences with these conditions.  Also states that it does not feel like pain associated with constipation which she has had in the past.  She previously has noted that symptoms seem to be worse around the time of her period; however, she now experiences symptoms constantly.  Changes were made in her OCP earlier this month and she has not had any noticeable improvement yet.  She reports history of asthma which is controlled on medications.  She denies any shortness of breath or chest pain.  History of heart palpitations, had workup with cardiology and Holter monitor revealed occasional PACs and PVCs. Started on metoprolol . Unremarkable echocardiogram 11/2022.  Patient reports her maternal  grandmother had colon cancer. Mother and father had colon polyps (father with multiple polyps requiring early repeat colonoscopy, mother with precancerous polyps).  Previous GI Procedures/Imaging   CT A/P 06/27/2023 1. No acute abnormality in the abdomen or pelvis. 2. Colonic stool burden compatible  with constipation. 3. Fat containing umbilical hernia.  RUQ US  03/28/2019 Cholelithiasis, stable. No evidence of acute cholecystitis.   Past Medical History:  Diagnosis Date   Asthma    Diagnosed age 19 or 34 yo.   Calculus of gallbladder    Chronic fatigue syndrome 2021   Endometriosis    Fibromyalgia     Past Surgical History:  Procedure Laterality Date   CHOLECYSTECTOMY N/A 09/01/2019   Procedure: LAPAROSCOPIC CHOLECYSTECTOMY;  Surgeon: Vanderbilt Ned, MD;  Location: [facility] OR;  Service: General;  Laterality: N/A;   WISDOM TOOTH EXTRACTION  2011    Current Outpatient Medications  Medication Sig Dispense Refill   albuterol  (VENTOLIN  HFA) 108 (90 Base) MCG/ACT inhaler Inhale 2 puffs into the lungs every 4 (four) hours as needed for wheezing or shortness of breath. 18 g 2   azelastine  (ASTELIN ) 0.1 % nasal spray Place 2 sprays into both nostrils 2 (two) times daily. Use in each nostril as directed 30 mL 12   dicyclomine  (BENTYL ) 10 MG capsule Take 1 tab every 6 hours as needed for abdominal cramping. 60 capsule 0   drospirenone -ethinyl estradiol  (YASMIN  28) 3-0.03 MG tablet Take 1 tablet by mouth daily. 28 tablet 11   EPINEPHrine  0.3 mg/0.3 mL IJ SOAJ injection Inject 0.3 mg into the muscle as needed for anaphylaxis. 1 each 0   fluticasone  (FLONASE ) 50 MCG/ACT nasal spray Place 2 sprays into both nostrils daily. 16 g 2   fluticasone  (FLOVENT  HFA) 110 MCG/ACT inhaler Inhale 2 puffs into the lungs in the morning and at bedtime. Rinse mouth out after use. 1 each 1   gabapentin  (NEURONTIN ) 300 MG capsule Take 1 capsule (300 mg total) by mouth 3 (three) times daily. 90 capsule 0   metoprolol  succinate (TOPROL  XL) 25 MG 24 hr tablet Take 1 tablet (25 mg total) by mouth at bedtime. 90 tablet 3   montelukast  (SINGULAIR ) 10 MG tablet TAKE 1 TABLET(10 MG) BY MOUTH AT BEDTIME 90 tablet 2   ondansetron  (ZOFRAN -ODT) 4 MG disintegrating tablet Take 1 tablet (4 mg total) by mouth every 8 (eight) hours  as needed for nausea or vomiting. 20 tablet 0   sertraline  (ZOLOFT ) 100 MG tablet Take 1 tablet (100 mg total) by mouth daily. 90 tablet 3   No current facility-administered medications for this visit.    Allergies as of 07/04/2023 - Review Complete 06/13/2023  Allergen Reaction Noted   Amoxicillin  Anaphylaxis 04/20/2023   Bee venom Anaphylaxis and Swelling 07/25/2019   Shellfish allergy Anaphylaxis and Hives 06/27/2019   Fire ant Swelling 11/28/2022   Morphine   06/13/2022   Codeine Nausea Only 10/21/2016    Family History  Problem Relation Age of Onset   Healthy Mother    Bipolar disorder Mother        ?Schizophrenia   Healthy Father     Social History   Tobacco Use   Smoking status: Never   Smokeless tobacco: Never  Vaping Use   Vaping status: Never Used  Substance Use Topics   Alcohol use: No   Drug use: No     Review of Systems:    Constitutional: No weight loss, fever, chills, weakness or fatigue Skin: No rash or itching Cardiovascular: No chest  pain, chest pressure or palpitations   Respiratory: No SOB or cough Gastrointestinal: See HPI and otherwise negative Genitourinary: No dysuria or change in urinary frequency Neurological: No headache, dizziness or syncope Musculoskeletal: No new muscle or joint pain Hematologic: No bruising    Physical Exam:  Vital signs: BP 118/70   Ht 5' 10 (1.778 m)   Wt 251 lb (113.9 kg)   LMP 06/07/2023 (Approximate)   BMI 36.01 kg/m    Constitutional: Obese female in no acute distress, alert and cooperative Head:  Normocephalic and atraumatic.  Eyes: No scleral icterus. Conjunctiva pink. Mouth: No oral lesions. Respiratory: Respirations even and unlabored. Lungs clear to auscultation bilaterally.  No wheezes, crackles, or rhonchi.  Cardiovascular:  Regular rate and rhythm. No murmurs. No peripheral edema. Gastrointestinal:  Soft, nondistended, tender to light palpation of RLQ. No rebound or guarding. Normal bowel  sounds. No appreciable masses or hepatomegaly. Umbilical hernia appreciated which does not contain bowel. Rectal: Patient declined.  Defer to colonoscopy. Neurologic:  Alert and oriented x4;  grossly normal neurologically.  Skin:   Dry and intact without significant lesions or rashes. Psychiatric: Oriented to person, place and time. Demonstrates good judgement and reason without abnormal affect or behaviors.   RELEVANT LABS AND IMAGING: CBC    Component Value Date/Time   WBC 7.5 09/28/2022 1344   RBC 4.64 09/28/2022 1344   HGB 13.6 09/28/2022 1344   HCT 41.6 09/28/2022 1344   PLT 265.0 09/28/2022 1344   MCV 89.7 09/28/2022 1344   MCH 31.0 06/05/2022 1550   MCHC 32.7 09/28/2022 1344   RDW 12.4 09/28/2022 1344   LYMPHSABS 2.1 09/28/2022 1344   MONOABS 0.5 09/28/2022 1344   EOSABS 0.2 09/28/2022 1344   BASOSABS 0.1 09/28/2022 1344    CMP     Component Value Date/Time   NA 140 09/28/2022 1344   K 4.1 09/28/2022 1344   CL 105 09/28/2022 1344   CO2 25 09/28/2022 1344   GLUCOSE 74 09/28/2022 1344   BUN 16 09/28/2022 1344   CREATININE 1.12 09/28/2022 1344   CALCIUM 9.3 09/28/2022 1344   PROT 6.9 09/28/2022 1344   ALBUMIN 4.1 09/28/2022 1344   AST 10 09/28/2022 1344   ALT 9 09/28/2022 1344   ALKPHOS 52 09/28/2022 1344   BILITOT 0.9 09/28/2022 1344   GFRNONAA >60 06/05/2022 1550   GFRAA >60 08/27/2019 1329   Echocardiogram 11/29/2022 1. Left ventricular ejection fraction, by estimation, is 55 to 60% . The left ventricle has normal function. The left ventricle has no regional wall motion abnormalities. Left ventricular diastolic parameters were normal. The average left ventricular global longitudinal strain is - 19. 3 % . The global longitudinal strain is normal.  2. Right ventricular systolic function is normal. The right ventricular size is normal. There is normal pulmonary artery systolic pressure. The estimated right ventricular systolic pressure is 23. 4 mmHg.  3. The  mitral valve is normal in structure. No evidence of mitral valve regurgitation. No evidence of mitral stenosis.  4. The aortic valve is tricuspid. Aortic valve regurgitation is not visualized. No aortic stenosis is present.  5. The inferior vena cava is normal in size with greater than 50% respiratory variability, suggesting right atrial pressure of 3 mmHg.  Assessment/Plan:   RLQ abdominal pain Constipation Change in bowel habits Rectal bleeding Family history of colon cancer - maternal grandmother Family history of colon polyps - mother and father  Patient with change in bowel habits since September with associated RLQ  abdominal pain and nausea. Had been having a few bowel movements daily with soft stools which were easy to pass, now having harder stools and straining, but still having 2-3 bowel movements daily.  Pain and nausea had been occurring intermittently and seemed to be associated with her menstrual cycle, now patient states her symptoms are constant.  She is tender to palpation of the right lower quadrant on exam today.  She intermittently sees blood on the toilet paper which she attributes to hemorrhoids. Declined rectal exam.  CT A/P 06/27/2023 showed colonic stool burden consistent with constipation.  Appendix noted to be noninflamed. Denies fever/chills. Has family history of colon cancer in maternal grandmother, both of her parents have had polyps and her mother's were reportedly precancerous, found at age 77. Suspect IBS-C but will schedule colonoscopy to rule out underlying IBD or malignancy.   - Check labs today: CBC, CMP, TSH, ESR, CRP, TTG, IgA - Start MiraLAX 1 capful daily. - Start fiber supplement.  Gradually increase to 1 tablespoon daily.  Nausea Heartburn Patient reports intermittent heartburn and daily nausea.  Had previously been having some vomiting which seems to have resolved now.  Taking Tums as needed.  - Start pantoprazole  40mg  daily - Can pursue  further workup of nausea if no improvement on PPI.   Camie Furbish, PA-C [facility] 07/03/2023, 11:41 AM  Patient Care Team: Frann Mabel Mt, DO as PCP - General (Family Medicine)

## 2023-07-04 ENCOUNTER — Encounter: Payer: Self-pay | Admitting: Gastroenterology

## 2023-07-04 ENCOUNTER — Other Ambulatory Visit

## 2023-07-04 ENCOUNTER — Ambulatory Visit: Admitting: Gastroenterology

## 2023-07-04 VITALS — BP 118/70 | Ht 70.0 in | Wt 251.0 lb

## 2023-07-04 DIAGNOSIS — K625 Hemorrhage of anus and rectum: Secondary | ICD-10-CM

## 2023-07-04 DIAGNOSIS — R194 Change in bowel habit: Secondary | ICD-10-CM

## 2023-07-04 DIAGNOSIS — K5900 Constipation, unspecified: Secondary | ICD-10-CM

## 2023-07-04 DIAGNOSIS — Z83719 Family history of colon polyps, unspecified: Secondary | ICD-10-CM

## 2023-07-04 DIAGNOSIS — R1031 Right lower quadrant pain: Secondary | ICD-10-CM

## 2023-07-04 DIAGNOSIS — Z800 Family history of malignant neoplasm of digestive organs: Secondary | ICD-10-CM

## 2023-07-04 DIAGNOSIS — R12 Heartburn: Secondary | ICD-10-CM

## 2023-07-04 DIAGNOSIS — R110 Nausea: Secondary | ICD-10-CM

## 2023-07-04 LAB — CBC WITH DIFFERENTIAL/PLATELET
Basophils Absolute: 0.1 10*3/uL (ref 0.0–0.1)
Basophils Relative: 0.9 % (ref 0.0–3.0)
Eosinophils Absolute: 0.4 10*3/uL (ref 0.0–0.7)
Eosinophils Relative: 4.7 % (ref 0.0–5.0)
HCT: 41.4 % (ref 36.0–46.0)
Hemoglobin: 14 g/dL (ref 12.0–15.0)
Lymphocytes Relative: 28.7 % (ref 12.0–46.0)
Lymphs Abs: 2.4 10*3/uL (ref 0.7–4.0)
MCHC: 33.8 g/dL (ref 30.0–36.0)
MCV: 87.8 fl (ref 78.0–100.0)
Monocytes Absolute: 0.9 10*3/uL (ref 0.1–1.0)
Monocytes Relative: 10.8 % (ref 3.0–12.0)
Neutro Abs: 4.5 10*3/uL (ref 1.4–7.7)
Neutrophils Relative %: 54.9 % (ref 43.0–77.0)
Platelets: 266 10*3/uL (ref 150.0–400.0)
RBC: 4.72 Mil/uL (ref 3.87–5.11)
RDW: 12.7 % (ref 11.5–15.5)
WBC: 8.2 10*3/uL (ref 4.0–10.5)

## 2023-07-04 LAB — C-REACTIVE PROTEIN: CRP: <1 mg/dL (ref 0.5–20.0)

## 2023-07-04 LAB — COMPREHENSIVE METABOLIC PANEL WITH GFR
ALT: 29 U/L (ref 0–35)
AST: 15 U/L (ref 0–37)
Albumin: 4.6 g/dL (ref 3.5–5.2)
Alkaline Phosphatase: 56 U/L (ref 39–117)
BUN: 15 mg/dL (ref 6–23)
CO2: 27 meq/L (ref 19–32)
Calcium: 9.7 mg/dL (ref 8.4–10.5)
Chloride: 104 meq/L (ref 96–112)
Creatinine, Ser: 0.87 mg/dL (ref 0.40–1.20)
GFR: 87.85 mL/min (ref >60.00)
Glucose, Bld: 85 mg/dL (ref 70–99)
Potassium: 4.3 meq/L (ref 3.5–5.1)
Sodium: 139 meq/L (ref 135–145)
Total Bilirubin: 0.7 mg/dL (ref 0.2–1.2)
Total Protein: 7.6 g/dL (ref 6.0–8.3)

## 2023-07-04 LAB — SEDIMENTATION RATE: Sed Rate: 7 mm/h (ref 0–20)

## 2023-07-04 LAB — TSH: TSH: 1.74 u[IU]/mL (ref 0.35–5.50)

## 2023-07-04 MED ORDER — NA SULFATE-K SULFATE-MG SULF 17.5-3.13-1.6 GM/177ML PO SOLN: 1.0000 | ORAL | 0 refills | Status: DC

## 2023-07-04 MED ORDER — PANTOPRAZOLE SODIUM 40 MG PO TBEC: 40.0000 mg | DELAYED_RELEASE_TABLET | Freq: Every day | ORAL | 3 refills | Status: AC

## 2023-07-04 NOTE — Patient Instructions (Signed)
 Your provider has requested that you go to the basement level for lab work before leaving today. Press B on the elevator. The lab is located at the first door on the left as you exit the elevator.  We have sent the following medications to your pharmacy for you to pick up at your convenience: Suprep   Please purchase the following medications over the counter and take as directed:  A high fiber diet with plenty of fluids (up to 8 glasses of water daily) is suggested to relieve these symptoms.  Benefiber , gradually work up to 1 tablespoon once daily can be used to keep bowels regular if needed.  Start Miralax- 1 capful daily dissolved in  atleast 8 ounces of water daily.     Due to recent changes in healthcare laws, you may see the results of your imaging and laboratory studies on MyChart before your provider has had a chance to review them.  We understand that in some cases there may be results that are confusing or concerning to you. Not all laboratory results come back in the same time frame and the provider may be waiting for multiple results in order to interpret others.  Please give us  48 hours in order for your provider to thoroughly review all the results before contacting the office for clarification of your results.   You have been scheduled for a colonoscopy. Please follow written instructions given to you at your visit today.   If you use inhalers (even only as needed), please bring them with you on the day of your procedure.  DO NOT TAKE 7 DAYS PRIOR TO TEST- Trulicity (dulaglutide) Ozempic, Wegovy (semaglutide) Mounjaro (tirzepatide) Bydureon Bcise (exanatide extended release)  DO NOT TAKE 1 DAY PRIOR TO YOUR TEST Rybelsus (semaglutide) Adlyxin (lixisenatide) Victoza (liraglutide) Byetta (exanatide) ___________________________________________________________________________   Thank you for choosing me and [facility].

## 2023-07-05 ENCOUNTER — Encounter: Payer: Self-pay | Admitting: Gastroenterology

## 2023-07-05 LAB — IGA: Immunoglobulin A: 200 mg/dL (ref 47–310)

## 2023-07-05 LAB — TISSUE TRANSGLUTAMINASE, IGA: (tTG) Ab, IgA: <1 U/mL

## 2023-07-07 ENCOUNTER — Other Ambulatory Visit: Payer: Self-pay

## 2023-07-07 ENCOUNTER — Encounter (HOSPITAL_COMMUNITY): Payer: Self-pay | Admitting: Emergency Medicine

## 2023-07-07 ENCOUNTER — Emergency Department (HOSPITAL_COMMUNITY)
Admission: EM | Admit: 2023-07-07 | Discharge: 2023-07-07 | Discharge status: Against Medical Advice | Attending: Emergency Medicine | Admitting: Emergency Medicine

## 2023-07-07 ENCOUNTER — Emergency Department (HOSPITAL_COMMUNITY)

## 2023-07-07 DIAGNOSIS — Z5321 Procedure and treatment not carried out due to patient leaving prior to being seen by health care provider: Secondary | ICD-10-CM | POA: Diagnosis not present

## 2023-07-07 DIAGNOSIS — R1031 Right lower quadrant pain: Secondary | ICD-10-CM | POA: Diagnosis not present

## 2023-07-07 DIAGNOSIS — R110 Nausea: Secondary | ICD-10-CM | POA: Diagnosis not present

## 2023-07-07 LAB — CBC
HCT: 43.4 % (ref 36.0–46.0)
Hemoglobin: 14.3 g/dL (ref 12.0–15.0)
MCH: 29.7 pg (ref 26.0–34.0)
MCHC: 32.9 g/dL (ref 30.0–36.0)
MCV: 90 fL (ref 80.0–100.0)
Platelets: 278 10*3/uL (ref 150–400)
RBC: 4.82 MIL/uL (ref 3.87–5.11)
RDW: 12.1 % (ref 11.5–15.5)
WBC: 8.5 10*3/uL (ref 4.0–10.5)
nRBC: 0 % (ref 0.0–0.2)

## 2023-07-07 LAB — COMPREHENSIVE METABOLIC PANEL WITH GFR
ALT: 25 U/L (ref 0–44)
AST: 23 U/L (ref 15–41)
Albumin: 4 g/dL (ref 3.5–5.0)
Alkaline Phosphatase: 50 U/L (ref 38–126)
Anion gap: 9 (ref 5–15)
BUN: 16 mg/dL (ref 6–20)
CO2: 23 mmol/L (ref 22–32)
Calcium: 9.6 mg/dL (ref 8.9–10.3)
Chloride: 106 mmol/L (ref 98–111)
Creatinine, Ser: 0.92 mg/dL (ref 0.44–1.00)
GFR, Estimated: >60 mL/min (ref >60)
Glucose, Bld: 106 mg/dL — ABNORMAL HIGH (ref 70–99)
Potassium: 3.7 mmol/L (ref 3.5–5.1)
Sodium: 138 mmol/L (ref 135–145)
Total Bilirubin: 1 mg/dL (ref 0.0–1.2)
Total Protein: 7.5 g/dL (ref 6.5–8.1)

## 2023-07-07 LAB — LIPASE, BLOOD: Lipase: 32 U/L (ref 11–51)

## 2023-07-07 LAB — HCG, SERUM, QUALITATIVE: Preg, Serum: NEGATIVE

## 2023-07-07 MED ORDER — ONDANSETRON HCL 4 MG/2ML IJ SOLN
4.0000 mg | Freq: Once | INTRAMUSCULAR | Status: AC
  Administered 2023-07-07: 4 mg via INTRAVENOUS
  Filled 2023-07-07: qty 2

## 2023-07-07 MED ORDER — FENTANYL CITRATE PF 50 MCG/ML IJ SOSY
50.0000 ug | PREFILLED_SYRINGE | Freq: Once | INTRAMUSCULAR | Status: AC
  Administered 2023-07-07: 50 ug via INTRAVENOUS
  Filled 2023-07-07: qty 1

## 2023-07-07 MED ORDER — IOHEXOL 350 MG/ML SOLN
75.0000 mL | Freq: Once | INTRAVENOUS | Status: AC | PRN
  Administered 2023-07-07: 75 mL via INTRAVENOUS

## 2023-07-07 NOTE — ED Notes (Signed)
 EDP to bedside for MSE.  Okay to go to lobby.

## 2023-07-07 NOTE — ED Notes (Signed)
Pt stated she is leaving AMA due to wait

## 2023-07-07 NOTE — ED Provider Triage Note (Signed)
 Emergency Medicine Provider Triage Evaluation Note  Carol Marquez , a 33 y.o. female  was evaluated in triage.  Pt complains of right lower quadrant abdominal pain which began Thursday.  Patient endorses nausea without vomiting or diarrhea.  She denies fever, urinary changes.  She has history of endometriosis and umbilical hernia.  Review of Systems  Positive:  Negative:   Physical Exam  Ht 5' 10 (1.778 m)   Wt 113.4 kg   LMP 06/07/2023 (Approximate)   BMI 35.87 kg/m  Gen:   Awake, no distress   Resp:  Normal effort  MSK:   Moves extremities without difficulty  Other:    Medical Decision Making  Medically screening exam initiated at 12:44 AM.  Appropriate orders placed.  TIAN DAVISON was informed that the remainder of the evaluation will be completed by another provider, this initial triage assessment does not replace that evaluation, and the importance of remaining in the ED until their evaluation is complete.     Logan Ubaldo NOV[place], [place] 07/07/23 [id number]

## 2023-07-07 NOTE — ED Triage Notes (Signed)
 Pt to ED via [facility] from home c/o RLQ pain since Thursday.  Pt had CT scan recently and told it was umbilical hernia, also hx of endometriosis and fibromyalgia.  States nausea without vomiting or diarrhea, denies fevers, denies urinary changes.  Pt with tenderness with palpation and rebound tenderness to area.  Given 650mg  Tylenol  en route with EMS.

## 2023-07-07 NOTE — ED Notes (Signed)
 Pt complaining of pain in her IV. RN notified and IV removed as per pt request

## 2023-07-08 ENCOUNTER — Ambulatory Visit: Payer: Self-pay | Admitting: Gastroenterology

## 2023-07-19 ENCOUNTER — Encounter: Payer: Self-pay | Admitting: Internal Medicine

## 2023-07-19 ENCOUNTER — Ambulatory Visit (AMBULATORY_SURGERY_CENTER): Admitting: Internal Medicine

## 2023-07-19 VITALS — BP 106/76 | HR 67 | Temp 97.9°F | Resp 16 | Ht 70.0 in | Wt 251.0 lb

## 2023-07-19 DIAGNOSIS — K625 Hemorrhage of anus and rectum: Secondary | ICD-10-CM

## 2023-07-19 DIAGNOSIS — K644 Residual hemorrhoidal skin tags: Secondary | ICD-10-CM

## 2023-07-19 DIAGNOSIS — R1031 Right lower quadrant pain: Secondary | ICD-10-CM

## 2023-07-19 DIAGNOSIS — R194 Change in bowel habit: Secondary | ICD-10-CM

## 2023-07-19 HISTORY — PX: COLONOSCOPY WITH PROPOFOL: SHX5780

## 2023-07-19 MED ORDER — SODIUM CHLORIDE 0.9 % IV SOLN: 500.0000 mL | Freq: Once | INTRAVENOUS | Status: DC

## 2023-07-19 NOTE — Op Note (Addendum)
 [facility] Patient Name: Carol Marquez Procedure Date: 07/19/2023 10:18 AM MRN: [id number] Endoscopist: Lupita FORBES Commander , MD, [id number] Age: 33 Referring MD:  Date of Birth: Jul 29, 1990 Gender: Female Account #: [id number] Procedure:                Colonoscopy Indications:              Abdominal pain in the right lower quadrant, Rectal                            bleeding, Change in bowel habits Medicines:                Monitored Anesthesia Care Procedure:                Pre-Anesthesia Assessment:                           - Prior to the procedure, a History and Physical                            was performed, and patient medications and                            allergies were reviewed. The patient's tolerance of                            previous anesthesia was also reviewed. The risks                            and benefits of the procedure and the sedation                            options and risks were discussed with the patient.                            All questions were answered, and informed consent                            was obtained. Prior Anticoagulants: The patient has                            taken no anticoagulant or antiplatelet agents. ASA                            Grade Assessment: II - A patient with mild systemic                            disease. After reviewing the risks and benefits,                            the patient was deemed in satisfactory condition to                            undergo the procedure.  After obtaining informed consent, the colonoscope                            was passed under direct vision. Throughout the                            procedure, the patient's blood pressure, pulse, and                            oxygen saturations were monitored continuously. The                            CF [id number] #[id number] was introduced through the anus                            and advanced to the  the terminal ileum, with                            identification of the appendiceal orifice and IC                            valve. The colonoscopy was performed without                            difficulty. The patient tolerated the procedure                            well. The quality of the bowel preparation was                            fair. The terminal ileum, ileocecal valve,                            appendiceal orifice, and rectum were photographed.                            The bowel preparation used was SUPREP via split                            dose instruction. Scope In: 10:35:49 AM Scope Out: 10:46:30 AM Scope Withdrawal Time: 0 hours 8 minutes 13 seconds  Total Procedure Duration: 0 hours 10 minutes 41 seconds  Findings:                 Hemorrhoids were found on perianal exam.                           External hemorrhoids were found.                           The terminal ileum appeared normal.                           The exam was otherwise without abnormality on  direct and retroflexion views. Complications:            No immediate complications. Estimated Blood Loss:     Estimated blood loss: none. Impression:               - Preparation of the colon was fair.                           - Hemorrhoids found on perianal exam.                           - External hemorrhoids.                           - The examined portion of the ileum was normal.                           - The examination was otherwise normal on direct                            and retroflexion views. I think visualization                            adequate to exclude cause of pain                           - No specimens collected. Recommendation:           - Patient has a contact number available for                            emergencies. The signs and symptoms of potential                            delayed complications were discussed with the                             patient. Return to normal activities tomorrow.                            Written discharge instructions were provided to the                            patient.                           - Resume previous diet.                           - Continue present medications.                           - Repeat colonoscopy in 8 years for screening                            purposes. maternal grandmother CRCA and mother and  father had polyps                           - See GYN again (has appointment 7/17) - I think                            laparoscopy could be helpful to sort out cause of                            pain. Pain is not related to defecation. last CT                            showed some haziness and increased lymph nodes in                            root of menetery but one 1 week prior did not show                            that. She does have endometriosis. Note that                            patient said she was 1 day before menses with                            second CT 6/29. Lupita FORBES Commander, MD 07/19/2023 11:05:14 AM This report has been signed electronically.

## 2023-07-19 NOTE — Progress Notes (Signed)
 History and Physical Interval Note:  07/19/2023 10:15 AM  Carol Marquez  has presented today for endoscopic procedure(s), with the diagnosis of  Encounter Diagnoses  Name Primary?   Rectal bleeding Yes   Change in bowel habits    RLQ abdominal pain   .  The various methods of evaluation and treatment have been discussed with the patient and/or family. After consideration of risks, benefits and other options for treatment, the patient has consented to  the endoscopic procedure(s).   The patient's history has been reviewed, patient examined, no change in status, stable for endoscopic procedure(s).  I have reviewed the patient's chart and labs.  Questions were answered to the patient's satisfaction.    ED visit w/ CT 6/29 IMPRESSION: 1. No acute CT findings in the abdomen or pelvis. 2. Constipation and diverticulosis. 3. Mildly prominent liver and spleen. 4. Umbilical fat hernia. 5. Disc protrusions at L4-5 and L5-S1, the latter with a least mild mass effect on the left S1 nerve root. 6. Increased haziness in the mesenteric root fat with a greater than typical number of normal-sized lymph nodes. This could be due to mesenteric panniculitis or mesenteric adenitis.    Carol CHARLENA Commander, MD, NOL[place]

## 2023-07-19 NOTE — Progress Notes (Signed)
 Report to PACU, RN, vss, BBS= Clear.

## 2023-07-19 NOTE — Patient Instructions (Addendum)
 The colonoscopy did not reveal a cause for your problems.  The prep was less than desired but good enough to conclude that, I think.  I am not sure what is causing your pain you do have some signs of inflammation in the fat of the mesenteric root on your most recent CT scan.  However 7 days prior to that the radiologist did not indicate that was an issue.  I do think you should follow-up with your gynecologist.  I will message her as well so we can sort through this together to try to figure out the next step.  I appreciate the opportunity to care for you. Lupita CHARLENA Commander, MD, FACG   YOU HAD AN ENDOSCOPIC PROCEDURE TODAY AT THE [facility]:   Refer to the procedure report that was given to you for any specific questions about what was found during the examination.  If the procedure report does not answer your questions, please call your gastroenterologist to clarify.  If you requested that your care partner not be given the details of your procedure findings, then the procedure report has been included in a sealed envelope for you to review at your convenience later.  YOU SHOULD EXPECT: Some feelings of bloating in the abdomen. Passage of more gas than usual.  Walking can help get rid of the air that was put into your GI tract during the procedure and reduce the bloating. If you had a lower endoscopy (such as a colonoscopy or flexible sigmoidoscopy) you may notice spotting of blood in your stool or on the toilet paper. If you underwent a bowel prep for your procedure, you may not have a normal bowel movement for a few days.  Please Note:  You might notice some irritation and congestion in your nose or some drainage.  This is from the oxygen used during your procedure.  There is no need for concern and it should clear up in a day or so.  SYMPTOMS TO REPORT IMMEDIATELY:  Following lower endoscopy (colonoscopy or flexible sigmoidoscopy):  Excessive amounts of blood in the  stool  Significant tenderness or worsening of abdominal pains  Swelling of the abdomen that is new, acute  Fever of 100F or higher  For urgent or emergent issues, a gastroenterologist can be reached at any hour by calling [phone]. Do not use MyChart messaging for urgent concerns.    DIET:  We do recommend a small meal at first, but then you may proceed to your regular diet.  Drink plenty of fluids but you should avoid alcoholic beverages for 24 hours.  ACTIVITY:  You should plan to take it easy for the rest of today and you should NOT DRIVE or use heavy machinery until tomorrow (because of the sedation medicines used during the test).    FOLLOW UP: Our staff will call the number listed on your records the next business day following your procedure.  We will call around 7:15- 8:00 am to check on you and address any questions or concerns that you may have regarding the information given to you following your procedure. If we do not reach you, we will leave a message.     If any biopsies were taken you will be contacted by phone or by letter within the next 1-3 weeks.  Please call us  at [phone] if you have not heard about the biopsies in 3 weeks.    SIGNATURES/CONFIDENTIALITY: You and/or your care partner have signed paperwork which will be entered  into your electronic medical record.  These signatures attest to the fact that that the information above on your After Visit Summary has been reviewed and is understood.  Full responsibility of the confidentiality of this discharge information lies with you and/or your care-partner.

## 2023-07-22 ENCOUNTER — Telehealth: Payer: Self-pay | Admitting: Lactation Services

## 2023-07-22 NOTE — Telephone Encounter (Signed)
  Follow up Call-     07/19/2023    9:54 AM  Call back number  Post procedure Call Back phone  # [phone]  Permission to leave phone message Yes     Patient questions:  Do you have a fever, pain , or abdominal swelling? No. Pain Score  0 *  Have you tolerated food without any problems? Yes.    Have you been able to return to your normal activities? Yes.    Do you have any questions about your discharge instructions: Diet   No. Medications  No. Follow up visit  No.  Do you have questions or concerns about your Care? No.  Actions: * If pain score is 4 or above: No action needed, pain <4.

## 2023-07-25 ENCOUNTER — Ambulatory Visit: Admitting: Obstetrics and Gynecology

## 2023-07-25 ENCOUNTER — Encounter: Payer: Self-pay | Admitting: Obstetrics and Gynecology

## 2023-07-25 VITALS — BP 123/82 | HR 92 | Wt 249.9 lb

## 2023-07-25 DIAGNOSIS — G8929 Other chronic pain: Secondary | ICD-10-CM

## 2023-07-25 DIAGNOSIS — I880 Nonspecific mesenteric lymphadenitis: Secondary | ICD-10-CM

## 2023-07-25 DIAGNOSIS — R109 Unspecified abdominal pain: Secondary | ICD-10-CM

## 2023-07-25 DIAGNOSIS — N939 Abnormal uterine and vaginal bleeding, unspecified: Secondary | ICD-10-CM | POA: Diagnosis not present

## 2023-07-25 DIAGNOSIS — N809 Endometriosis, unspecified: Secondary | ICD-10-CM

## 2023-07-25 NOTE — Patient Instructions (Signed)
 Other option: IUD for bleeding an pain; endometrial ablation for bleeding  Diagnostic laparoscopy with hysteroscopy - surgery to look inside of your belly, remove endometriosis that can be done safely as well as look inside your uterus and get a sample at that time. If interested, can place an IUD at that time as well.

## 2023-07-25 NOTE — Progress Notes (Signed)
 GYNECOLOGY VISIT  Patient name: Carol Marquez MRN [id number]  Date of birth: 1990/01/18 Chief Complaint:   No chief complaint on file.   History:  SCHYLER BUTIKOFER is a 33 y.o. G0P0000 being seen today for follow up. Started OCPs with next menses and noticed the bleeding and PMDD improved ; colnoscopy last week that was negative. Second CT 12 hours prior to menses, m wall that wasn' seen was swollen and recommend laparoscopy to look sinside. Kept forgetting to take the birth control and so started it on the first day of the period with the birth control on board. Period lasted for about 3 days which is much shorter than what she is used to. Feels like the PMDD I also improved as well. Reports feeling dizzy during the first few days of bleeding and reports having large chunks of lining that are coming out.   Reports mother had month long menses that were very heavy and had a hysterectomy. Aunt had her uterus fell out with her second a child. Another aunt had some issues as well. Reports great grandmother had to have a hysterectomy early as well.   Considering hysterectomy to at least not have bleeding. Does not want to have children.   Past Medical History:  Diagnosis Date   Asthma    Diagnosed age 57 or 33 yo.   Calculus of gallbladder    Chronic fatigue syndrome 2021   Endometriosis    Fibromyalgia     Past Surgical History:  Procedure Laterality Date   CHOLECYSTECTOMY N/A 09/01/2019   Procedure: LAPAROSCOPIC CHOLECYSTECTOMY;  Surgeon: Vanderbilt Ned, MD;  Location: [facility] OR;  Service: General;  Laterality: N/A;   WISDOM TOOTH EXTRACTION  2011    The following portions of the patient's history were reviewed and updated as appropriate: allergies, current medications, past family history, past medical history, past social history, past surgical history and problem list.   Health Maintenance:   Last pap     Component Value Date/Time   DIAGPAP  06/13/2022 1550    -  Negative for intraepithelial lesion or malignancy (NILM)   DIAGPAP  09/24/2018 1035    - Negative for intraepithelial lesion or malignancy (NILM)   HPVHIGH Negative 06/13/2022 1550   ADEQPAP  06/13/2022 1550    Satisfactory for evaluation; transformation zone component PRESENT.   ADEQPAP  09/24/2018 1035    Satisfactory for evaluation; transformation zone component PRESENT.    High Risk HPV: Positive  Adequacy:  Satisfactory for evaluation, transformation zone component PRESENT  Diagnosis:  Atypical squamous cells of undetermined significance (ASC-US )  Last mammogram: n/a   Review of Systems:  Pertinent items are noted in HPI. Comprehensive review of systems was otherwise negative.   Objective:  Physical Exam BP 123/82   Pulse 92   Wt 249 lb 14.4 oz (113.4 kg)   LMP 07/07/2023 (Exact Date)   BMI 35.86 kg/m    Physical Exam Vitals and nursing note reviewed.  Constitutional:      Appearance: Normal appearance.  HENT:     Head: Normocephalic and atraumatic.  Pulmonary:     Effort: Pulmonary effort is normal.  Skin:    General: Skin is warm and dry.  Neurological:     General: No focal deficit present.     Mental Status: She is alert.  Psychiatric:        Mood and Affect: Mood normal.        Behavior: Behavior normal.  Thought Content: Thought content normal.        Judgment: Judgment normal.      Labs and Imaging CT ABDOMEN PELVIS W CONTRAST Result Date: 07/07/2023 CLINICAL DATA:  Right lower quadrant pain. EXAM: CT ABDOMEN AND PELVIS WITH CONTRAST TECHNIQUE: Multidetector CT imaging of the abdomen and pelvis was performed using the standard protocol following bolus administration of intravenous contrast. RADIATION DOSE REDUCTION: This exam was performed according to the departmental dose-optimization program which includes automated exposure control, adjustment of the mA and/or kV according to patient size and/or use of iterative reconstruction technique.  CONTRAST:  75mL OMNIPAQUE  IOHEXOL  350 MG/ML SOLN COMPARISON:  CTs with IV contrast 06/27/2023 and 11/13/2015. Also, right upper quadrant ultrasound 03/28/2019. FINDINGS: Lower chest: No abnormality. Hepatobiliary: The liver is 18 cm length mildly steatotic. A 1.3 cm cyst noted in segment 2. Mild focal periligamentous fat in segment 4 B. No mass enhancement. Remote cholecystectomy without biliary dilatation. Pancreas: No abnormality. Spleen: Mildly prominent, 14.4 cm AP.  No mass. Adrenals/Urinary Tract: No abnormality. Stomach/Bowel: No dilatation or wall thickening including the retrocecal appendix. There is mild-to-moderate fecal stasis ascending and proximal transverse colon, scattered descending and sigmoid diverticulosis, without evidence of colitis or diverticulitis. Vascular/Lymphatic: No significant vascular findings are present. No enlarged abdominal or pelvic lymph nodes. There is a greater than typical number of normal-sized lymph nodes the mesenteric root fat. None of them are enlarged. There is increased haziness in the mesenteric root fat. Reproductive: Uterus and bilateral adnexa are unremarkable. Other: Umbilical fat hernia. No incarcerated hernia. No free hemorrhage, free fluid or free air. Musculoskeletal: No acute or significant osseous findings. There is a shallow central disc protrusion head L4-5, a left paracentral protruding disc at L5-S1, the latter with a least mild mass effect on the left S1 nerve root and both are new from 2017. IMPRESSION: 1. No acute CT findings in the abdomen or pelvis. 2. Constipation and diverticulosis. 3. Mildly prominent liver and spleen. 4. Umbilical fat hernia. 5. Disc protrusions at L4-5 and L5-S1, the latter with a least mild mass effect on the left S1 nerve root. 6. Increased haziness in the mesenteric root fat with a greater than typical number of normal-sized lymph nodes. This could be due to mesenteric panniculitis or mesenteric adenitis. Electronically  Signed   By: Francis Quam M.D.   On: 07/07/2023 02:48   CT ABDOMEN PELVIS W CONTRAST Result Date: 06/30/2023 CLINICAL DATA:  Abdominal pain, history of narrowed ureter, prior cholecystectomy. EXAM: CT ABDOMEN AND PELVIS WITH CONTRAST TECHNIQUE: Multidetector CT imaging of the abdomen and pelvis was performed using the standard protocol following bolus administration of intravenous contrast. RADIATION DOSE REDUCTION: This exam was performed according to the departmental dose-optimization program which includes automated exposure control, adjustment of the mA and/or kV according to patient size and/or use of iterative reconstruction technique. CONTRAST:  100mL ISOVUE -300 IOPAMIDOL  (ISOVUE -300) INJECTION 61% COMPARISON:  Multiple priors including ultrasound March 28, 2019 and CT November 13, 2015 FINDINGS: Lower chest: No acute abnormality. Hepatobiliary: No suspicious hepatic lesion. Cyst in the left lobe of the liver on image 13/2. Gallbladder surgically absent. No biliary ductal dilation. Pancreas: No pancreatic ductal dilation or evidence of acute inflammation. Spleen: No splenomegaly. Adrenals/Urinary Tract: Bilateral adrenal glands appear normal. No hydronephrosis. Kidneys demonstrate symmetric enhancement. Urinary bladder is unremarkable for degree of distension. Stomach/Bowel: Radiopaque enteric contrast material traverses the sigmoid colon. Stomach is unremarkable for degree of distension. Noninflamed appendix. Colonic stool burden compatible with constipation. Vascular/Lymphatic: No  significant vascular findings are present. No enlarged abdominal or pelvic lymph nodes. Reproductive: Uterus and bilateral adnexa are unremarkable. Other: Fat containing umbilical hernia. Musculoskeletal: No acute osseous abnormality. IMPRESSION: 1. No acute abnormality in the abdomen or pelvis. 2. Colonic stool burden compatible with constipation. 3. Fat containing umbilical hernia. Electronically Signed   By: Reyes Holder  M.D.   On: 06/30/2023 11:22       Assessment & Plan:   1. Endometriosis (Primary) 2. Abnormal uterine bleeding (AUB) Expressed interest in hysterectomy as they are certain they do not want to bear children. Reviewed that hysterectomy will provide definitive cessation to bleeding but not necessarily pain. Would need to decide if hysterectomy would be ovarian sparing or not. Reviewed chronic, inflammatory nature of endometriosis  3. Chronic abdominal pain 4. Mesenteric adenitis Referral to general to surgery to evaluate pain and CT. If general surgery reviews CT and finds that either it's not significant or does not require surgical intervention, will plan for at least diagnostic laparoscopy to assess endometriosis vs possible hysterectomy. No evidence of ureteral compression on 2 most recent CT scans.  - Ambulatory referral to General Surgery   Routine preventative health maintenance measures emphasized.  Carter Quarry, MD Minimally Invasive Gynecologic Surgery Center for [facility], [facility]

## 2023-09-10 ENCOUNTER — Encounter: Payer: Self-pay | Admitting: Family Medicine

## 2023-09-10 ENCOUNTER — Ambulatory Visit (INDEPENDENT_AMBULATORY_CARE_PROVIDER_SITE_OTHER): Admitting: Family Medicine

## 2023-09-10 VITALS — BP 120/82 | HR 87 | Temp 98.0°F | Resp 16 | Ht 70.0 in | Wt 252.0 lb

## 2023-09-10 DIAGNOSIS — M545 Low back pain, unspecified: Secondary | ICD-10-CM

## 2023-09-10 DIAGNOSIS — S46819A Strain of other muscles, fascia and tendons at shoulder and upper arm level, unspecified arm, initial encounter: Secondary | ICD-10-CM | POA: Diagnosis not present
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
## 2023-09-10 NOTE — Patient Instructions (Signed)
 Heat (pad or rice pillow in microwave) over affected area, 10-15 minutes twice daily.   Ice/cold pack over area for 10-15 min twice daily.  OK to take Tylenol 1000 mg (2 extra strength tabs) or 975 mg (3 regular strength tabs) every 6 hours as needed.  Let us know if you need anything.

## 2023-09-19 ENCOUNTER — Telehealth: Admitting: Physician Assistant

## 2023-09-19 DIAGNOSIS — J069 Acute upper respiratory infection, unspecified: Secondary | ICD-10-CM | POA: Diagnosis not present

## 2023-09-20 MED ORDER — BENZONATATE 100 MG PO CAPS: 100.0000 mg | ORAL_CAPSULE | Freq: Three times a day (TID) | ORAL | 0 refills | Status: DC | PRN

## 2023-09-20 MED ORDER — FLUTICASONE PROPIONATE 50 MCG/ACT NA SUSP: 2.0000 | Freq: Every day | NASAL | 0 refills | Status: AC

## 2023-09-20 NOTE — Progress Notes (Signed)

## 2023-09-26 ENCOUNTER — Other Ambulatory Visit: Payer: Self-pay

## 2023-09-26 ENCOUNTER — Ambulatory Visit: Admitting: Obstetrics and Gynecology

## 2023-09-26 VITALS — BP 126/89 | HR 80 | Wt 253.4 lb

## 2023-09-26 DIAGNOSIS — Z01818 Encounter for other preprocedural examination: Secondary | ICD-10-CM

## 2023-09-26 DIAGNOSIS — N939 Abnormal uterine and vaginal bleeding, unspecified: Secondary | ICD-10-CM | POA: Diagnosis not present

## 2023-09-26 DIAGNOSIS — Z1331 Encounter for screening for depression: Secondary | ICD-10-CM | POA: Diagnosis not present

## 2023-09-26 LAB — HEMOGLOBIN A1C
Est. average glucose Bld gHb Est-mCnc: 97 mg/dL
Hgb A1c MFr Bld: 5 % (ref 4.8–5.6)

## 2023-09-26 NOTE — Progress Notes (Signed)
 GYNECOLOGY VISIT  Patient name: CHARMELLE Marquez MRN [id number]  Date of birth: 04-08-1990 Chief Complaint:   Follow-up  History:  Discussed the use of AI scribe software for clinical note transcription with the patient, who gave verbal consent to proceed.  History of Present Illness Carol Marquez is a 33 year old female with chronic pelvic pain and heavy menstrual bleeding who presents with questions regarding a possible hysterectomy.  She has a history of chronic pelvic pain and heavy menstrual bleeding. Prior to starting birth control, she experienced constant pain, which has somewhat improved with the medication. However, she still experiences intense pain and significant bloating around her menstrual period. She takes birth control continuously, including the placebo pills, due to anxiety about skipping them.  Her periods have been heavy since menarche at age 78, with the first two days being particularly heavy. This pattern is consistent with her family history, as her mother, grandmother, and aunts also experienced heavy periods. The number of clots varies monthly but has been consistent since menarche.  She has a family history of infertility and notes that despite opportunities, she has not become pregnant. She expresses concerns about potential incontinence post-hysterectomy, noting difficulty holding urine the day before her period, which resolves once her period starts. She attributes this to hormonal changes associated with her menstrual cycle.  She has a history of fibromyalgia. She recalls a previous surgical experience where she woke up in significant pain.  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 She is concerned about the recovery period post-hysterectomy, particularly regarding restrictions on lifting more than ten pounds, which will impact her ability to care for her pets.     The following portions of the patient's history were  reviewed and updated as appropriate: allergies, current medications, past family history, past medical history, past social history, past surgical history and problem list.   Health Maintenance:   Last pap     Component Value Date/Time   DIAGPAP  06/13/2022 1550    - Negative for intraepithelial lesion or malignancy (NILM)   DIAGPAP  09/24/2018 1035    - Negative for intraepithelial lesion or malignancy (NILM)   HPVHIGH Negative 06/13/2022 1550   ADEQPAP  06/13/2022 1550    Satisfactory for evaluation; transformation zone component PRESENT.   ADEQPAP  09/24/2018 1035    Satisfactory for evaluation; transformation zone component PRESENT.    Health Maintenance  Topic Date Due   Hepatitis B Vaccine (1 of 3 - 19+ 3-dose series) Never done   HPV Vaccine (1 - Risk 3-dose SCDM series) Never done   COVID-19 Vaccine (3 - Pfizer risk series) 05/27/2019   DTaP/Tdap/Td vaccine (2 - Td or Tdap) 08/09/2022   Flu Shot  Never done   Pap with HPV screening  06/13/2027   Colon Cancer Screening  07/19/2030   Hepatitis C Screening  Completed   HIV Screening  Completed   Meningitis B Vaccine  Aged Out   Pneumococcal Vaccine  Discontinued    Review of Systems:  Pertinent items are noted in HPI. Comprehensive review of systems was otherwise negative.   Objective:  Physical Exam BP 126/89   Pulse 80   Wt 253 lb 6.4 oz (114.9 kg)   LMP 09/25/2023 (Exact Date)   BMI 36.36 kg/m    Physical Exam Vitals and nursing note reviewed.  Constitutional:      Appearance: Normal appearance.  HENT:     Head: Normocephalic and atraumatic.  Pulmonary:     Effort: Pulmonary effort is normal.  Skin:    General: Skin is warm and dry.  Neurological:     General: No focal deficit present.     Mental Status: She is alert.  Psychiatric:        Mood and Affect: Mood normal.        Behavior: Behavior normal.        Thought Content: Thought content normal.        Judgment: Judgment normal.      Labs  and Imaging IMPRESSION: 1. No acute CT findings in the abdomen or pelvis. 2. Constipation and diverticulosis. 3. Mildly prominent liver and spleen. 4. Umbilical fat hernia. 5. Disc protrusions at L4-5 and L5-S1, the latter with a least mild mass effect on the left S1 nerve root. 6. Increased haziness in the mesenteric root fat with a greater than typical number of normal-sized lymph nodes. This could be due to mesenteric panniculitis or mesenteric adenitis.        Assessment & Plan:   Assessment & Plan Chronic pelvic pain and menorrhagia, planned hysterectomy Persistent symptoms despite birth control. Considering hysterectomy to alleviate symptoms. Discussed procedure details, risks, and post-operative considerations. Schedule hysterectomy. - Complete hysterectomy consent form. - Consider endometrial biopsy but primary indication is dysmenorrhea; heavy menses is chronic.unchanged since menarche - Discuss insurance coverage with scheduler.  Fibromyalgia Fibromyalgia may complicate post-operative pain management. Discussed potential for increased pain post-hysterectomy and need for careful management.  Diabetes screening Screening necessary prior to hysterectomy for optimal healing. A1c test not performed in past year. - Order A1c test.   Carter Quarry, MD Minimally Invasive Gynecologic Surgery Center for [facility], [facility]

## 2023-09-30 ENCOUNTER — Ambulatory Visit: Payer: Self-pay | Admitting: Obstetrics and Gynecology

## 2023-09-30 ENCOUNTER — Encounter: Payer: Self-pay | Admitting: *Deleted

## 2023-10-23 ENCOUNTER — Telehealth: Payer: Self-pay

## 2023-10-23 NOTE — Telephone Encounter (Signed)
 I called the patient to see if she's available for surgery w/ Dr. Jeralyn on 11/12/23. I left a voicemail requesting a call back.

## 2023-11-01 ENCOUNTER — Encounter: Payer: Self-pay | Admitting: Obstetrics and Gynecology

## 2023-11-04 ENCOUNTER — Encounter (HOSPITAL_COMMUNITY): Payer: Self-pay | Admitting: Obstetrics and Gynecology

## 2023-11-05 ENCOUNTER — Encounter (HOSPITAL_COMMUNITY): Payer: Self-pay | Admitting: Obstetrics and Gynecology

## 2023-11-05 NOTE — Progress Notes (Signed)
 Spoke w/ via phone for pre-op interview--- pt Lab needs dos----   upt (per anes)      Lab results------ lab appt 11-08-2023 @ 1100 getting CBC/ T&S COVID test -----patient states asymptomatic no test needed Arrive at ------- 0915 on 11-12-2023 NPO after MN NO Solid Food.  Clear liquids from MN until--- 0815 Pre-Surgery Ensure or G2: n/a  Med rec completed Medications to take morning of surgery ----- protonix ,  flovent  inhaler/ if needed albuterol  inhaler (asked to bring) Diabetic medication ----- n/a  GLP1 agonist last dose: n/a GLP1 instructions:  Patient instructed no nail polish to be worn day of surgery Patient instructed to bring photo id and insurance card day of surgery Patient aware to have Driver (ride ) / caregiver    for 24 hours after surgery - mother, tracey Bramblett Patient Special Instructions ----- will pick up soap and written instructions at lab appt Pre-Op special Instructions ----- n/a  Patient verbalized understanding of instructions that were given at this phone interview. Patient denies chest pain, sob, fever, cough at the interview.

## 2023-11-05 NOTE — Pre-Procedure Instructions (Signed)
 Surgical Instructions  Your procedure is scheduled on :   Tuesday,  11-12-2023 Report to [facility] Main Entrance A at 9:15 AM, then check in the Admitting office. Any questions or running late day of surgery :  call [phone]  Questions prior to your surgery day:  call [phone], Monday -- Friday 8am - 4pm. If you experience any cold or flu symptoms such as cough, fever, chills, shortness of breath, etc. between now and you scheduled surgery, please notify your surgeon office.   Remember: Do Not eat any food after midnight the night before surgery. You may have clear liquids from midnight night before surgery until 8:15 AM.   Clear liquids allowed are:  Water             Carbonated Beverages (diabetics choose diet or no sugar options)  Clear Tea ( no milk, no honey, etc.)  Black coffee ( NO MILK, CREAM OR POWDERED CREAMER OF ANY KIND)  Sport drinks, like Gatorade (diabetes choose diet or no sugar options)  NO clear liquid after 8:15 AM day of surgery.  This includes No water,  candy,  gum, and mints.  Take these medicines the morning of surgery with A SIPS OF WATER:   Pantoprozole (protonix ) Fluticasone  (flovent ) inhaler  May take these medicines IF NEEDED:   Ondansetron  (zofran ) Albuterol  (ventolin ) inhaler ~~ Please albuterol  inhaler with you day of surgery.  One week prior to surgery, STOP taking any Aspirin (unless otherwise instructed by your surgeon) Aleve, Naproxen, ibuprofen , Motrin , Advil , Goody's, BC's, all herbal medications/ supplements, fish oil, and non-prescription vitamins.  Do NOT Smoke (tobacco/ vaping) and Do Not drink alcohol for 24 hours prior to your procedure.  For those patients that use a CPAP.  Please bring your CPAP/ mask/ tubing with them day of surgery . Anesthesia may ask recovery room nurse to use and if you stay the night you be asked to use it.  You will be asked to removed any contacts, glasses, piercing's, hearing aid's, dentures/ partials  prior to surgery.  Please bring cases/ container/ solution/ etc., for them day of surgery.   Patients discharged the day of surgery will NOT be allowed to drive home.  You must have responsible driver and caregiver to stay at home with you the next 24 hours.  SURGICAL WAITING ROOM VISITATION Patients may have no more than 2 support people in the waiting area - if more than 2 , these visitors may rotate.  Pre-op nurse will coordinate an appropriate time for 1 Adult support person, who may not rotate, to accompany patient in pre-op.  Aware some patients may have certain circumstances, speak to pre-op nurse day of surgery.  Children under the age 3 must have an adult with them who is not the patient and must remain in the main waiting area with an adult.  If the patient needs to stay at the hospital during part of their recovery, the visitor guidelines for inpatient rooms apply.  Please refer to the [facility] website for the visitor guidelines for any additional information.  If you received a COVID test during your pre-op visit it is requested that you wear a mask when out in public, stay away from anyone that may not be feeling well and notify your surgeon if you develop symptoms.  If you have been in contact with anyone that has tested positive in the past 10 days notify your surgeon.     [place] - Preparing for Surgery  Before  surgery, you can play an important role. Because skin is not sterile, it needs to be as free of germs as possible. You can reduce the number of germs on your skin by washing with CHG (chlorhexidine  gluconate) soap before surgery. CHG is an antiseptic cleaner which kills germs and bonds with the skin to continue killing germs even after washing. Oral hygiene is also important in reducing the risk of infection. Remember to brush your teeth with your regular toothpaste the morning of surgery.  Please DO NOT use if you have an allergy to CHG or antibacterial soaps. If  your skin becomes reddened/irritated stop using the CHG and inform your Pre-op nurse day of surgery.  DO NOT shave (including legs and genital area) for at least 48 hours prior to your CHG shower.   Please follow these instructions carefully:  Shower with CHG soap the night before surgery. If you choose to wash your hair, wash your hair first as usual with your normal shampoo. After you shampoo, rinse your hair and body thoroughly to remove the shampoo. Use CHG as you would any other liquid soap. You can apply CHG directly to the skin and wash gently with a clean washcloth or shower sponge. Apply the CHG soap to your body ONLY FROM THE NECK DOWN. Do not use on open wounds or open sores. Avoid contact with your eyes, ears, mouth, and genitals (private parts). Wash genitals (private parts) with your normal soap. Wash thoroughly, paying special attention to the area where your surgery will be performed. Thoroughly rinse your body with warm water from the neck down. DO NOT shower/wash with your normal soap after using and rinsing off the CHG soap. DO NOT use lotions, oils, etc., after showering with CHG. Pat yourself dry with a clean towel. Wear clean pajamas. Place clean sheets on your bed the night of your CHG shower and do not sleep with pets.  Day of Surgery  DO NOT Apply any lotions,  powder,  oils,  deodorants (may use underarm deodorant),  cologne/  perfumes  or makeup Do Not wear jewelry /  piercing's/  metal/  permanent jewelry must be removed prior to arrival day of surgery. (No plastic piercing) Do Not wear nail polish,  gel polish,  artificial nails, or any other type of covering on natural finger nails (toe nails are okay) Remember to brush your teeth and rinse mouth out. Put on clean / comfortable clothes. [place] is not responsible for valuables/ personal belongings

## 2023-11-08 ENCOUNTER — Encounter (HOSPITAL_COMMUNITY)
Admission: RE | Admit: 2023-11-08 | Discharge: 2023-11-08 | Disposition: A | Discharge status: Home / Self Care | Source: Ambulatory Visit | Attending: Obstetrics and Gynecology | Admitting: Obstetrics and Gynecology

## 2023-11-08 DIAGNOSIS — Z01818 Encounter for other preprocedural examination: Secondary | ICD-10-CM | POA: Diagnosis present

## 2023-11-08 DIAGNOSIS — Z01812 Encounter for preprocedural laboratory examination: Secondary | ICD-10-CM | POA: Diagnosis not present

## 2023-11-08 LAB — TYPE AND SCREEN
ABO/RH(D): A POS
Antibody Screen: NEGATIVE

## 2023-11-08 LAB — CBC
HCT: 46 % (ref 36.0–46.0)
Hemoglobin: 15.3 g/dL — ABNORMAL HIGH (ref 12.0–15.0)
MCH: 29.6 pg (ref 26.0–34.0)
MCHC: 33.3 g/dL (ref 30.0–36.0)
MCV: 89 fL (ref 80.0–100.0)
Platelets: 328 K/uL (ref 150–400)
RBC: 5.17 MIL/uL — ABNORMAL HIGH (ref 3.87–5.11)
RDW: 12 % (ref 11.5–15.5)
WBC: 7.6 K/uL (ref 4.0–10.5)
nRBC: 0 % (ref 0.0–0.2)

## 2023-11-11 NOTE — Progress Notes (Signed)
 LM for pt and pt's mother to be at hospital at 0530 for surgery.

## 2023-11-11 NOTE — Anesthesia Preprocedure Evaluation (Signed)
 Anesthesia Evaluation  Patient identified by MRN, date of birth, ID band Patient awake    Reviewed: Allergy & Precautions, NPO status , Patient's Chart, lab work & pertinent test results, reviewed documented beta blocker date and time   History of Anesthesia Complications Negative for: history of anesthetic complications  Airway Mallampati: III  TM Distance: >3 FB Neck ROM: Full   Comment: Previous grade I view with Miller 2, easy mask Dental  (+) Dental Advisory Given   Pulmonary neg shortness of breath, asthma (last used rescue inhaler 2 weeks ago) , neg sleep apnea, neg COPD, neg recent URI   Pulmonary exam normal breath sounds clear to auscultation       Cardiovascular (-) hypertension(-) angina (-) Past MI, (-) Cardiac Stents and (-) CABG + dysrhythmias (palpitations, Type 1 second degree AV block, PACs/PVCs, on metoprolol  (takes at night and took last night))  Rhythm:Regular Rate:Normal  TTE 11/29/2022: IMPRESSIONS    1. Left ventricular ejection fraction, by estimation, is 55 to 60%. The  left ventricle has normal function. The left ventricle has no regional  wall motion abnormalities. Left ventricular diastolic parameters were  normal. The average left ventricular  global longitudinal strain is -19.3 %. The global longitudinal strain is  normal.   2. Right ventricular systolic function is normal. The right ventricular  size is normal. There is normal pulmonary artery systolic pressure. The  estimated right ventricular systolic pressure is 23.4 mmHg.   3. The mitral valve is normal in structure. No evidence of mitral valve  regurgitation. No evidence of mitral stenosis.   4. The aortic valve is tricuspid. Aortic valve regurgitation is not  visualized. No aortic stenosis is present.   5. The inferior vena cava is normal in size with greater than 50%  respiratory variability, suggesting right atrial pressure of 3 mmHg.      Neuro/Psych neg Seizures PSYCHIATRIC DISORDERS (PTSD) Anxiety Depression    Chronic motor tic disorder    GI/Hepatic Neg liver ROS,GERD  Medicated,,  Endo/Other  negative endocrine ROS    Renal/GU negative Renal ROS     Musculoskeletal  (+) Arthritis ,  Fibromyalgia -  Abdominal  (+) + obese  Peds  Hematology  (+) Blood dyscrasia, anemia Lab Results      Component                Value               Date                      WBC                      7.6                 11/08/2023                HGB                      15.3 (H)            11/08/2023                HCT                      46.0                11/08/2023  MCV                      89.0                11/08/2023                PLT                      328                 11/08/2023              Anesthesia Other Findings   Reproductive/Obstetrics Endometriosis                               Anesthesia Physical Anesthesia Plan  ASA: 2  Anesthesia Plan: General   Post-op Pain Management: Tylenol  PO (pre-op)*, Toradol IV (intra-op)* and Ketamine IV*   Induction: Intravenous  PONV Risk Score and Plan: 3 and Ondansetron , Dexamethasone , Midazolam , Scopolamine  patch - Pre-op and Treatment may vary due to age or medical condition  Airway Management Planned: Oral ETT  Additional Equipment:   Intra-op Plan:   Post-operative Plan: Extubation in OR  Informed Consent: I have reviewed the patients History and Physical, chart, labs and discussed the procedure including the risks, benefits and alternatives for the proposed anesthesia with the patient or authorized representative who has indicated his/her understanding and acceptance.     Dental advisory given  Plan Discussed with: CRNA and Anesthesiologist  Anesthesia Plan Comments: (Risks of general anesthesia discussed including, but not limited to, sore throat, hoarse voice, chipped/damaged teeth, injury to vocal  cords, nausea and vomiting, allergic reactions, lung infection, heart attack, stroke, and death. All questions answered. )         Anesthesia Quick Evaluation

## 2023-11-12 ENCOUNTER — Encounter (HOSPITAL_COMMUNITY): Payer: Self-pay | Admitting: Obstetrics and Gynecology

## 2023-11-12 ENCOUNTER — Encounter (HOSPITAL_COMMUNITY): Payer: Self-pay | Admitting: Anesthesiology

## 2023-11-12 ENCOUNTER — Encounter (HOSPITAL_COMMUNITY)
Admission: RE | Disposition: A | Discharge status: Home / Self Care | Payer: Self-pay | Source: Home / Self Care | Attending: Obstetrics and Gynecology

## 2023-11-12 ENCOUNTER — Ambulatory Visit (HOSPITAL_BASED_OUTPATIENT_CLINIC_OR_DEPARTMENT_OTHER): Payer: Self-pay | Admitting: Anesthesiology

## 2023-11-12 ENCOUNTER — Other Ambulatory Visit (HOSPITAL_COMMUNITY): Payer: Self-pay

## 2023-11-12 ENCOUNTER — Other Ambulatory Visit: Payer: Self-pay

## 2023-11-12 ENCOUNTER — Ambulatory Visit (HOSPITAL_COMMUNITY)
Admission: RE | Admit: 2023-11-12 | Discharge: 2023-11-12 | Disposition: A | Discharge status: Home / Self Care | Attending: Obstetrics and Gynecology | Admitting: Obstetrics and Gynecology

## 2023-11-12 DIAGNOSIS — N803 Endometriosis of pelvic peritoneum, unspecified: Secondary | ICD-10-CM | POA: Insufficient documentation

## 2023-11-12 DIAGNOSIS — K219 Gastro-esophageal reflux disease without esophagitis: Secondary | ICD-10-CM | POA: Insufficient documentation

## 2023-11-12 DIAGNOSIS — I441 Atrioventricular block, second degree: Secondary | ICD-10-CM | POA: Insufficient documentation

## 2023-11-12 DIAGNOSIS — I493 Ventricular premature depolarization: Secondary | ICD-10-CM | POA: Diagnosis not present

## 2023-11-12 DIAGNOSIS — N946 Dysmenorrhea, unspecified: Secondary | ICD-10-CM | POA: Diagnosis not present

## 2023-11-12 DIAGNOSIS — N939 Abnormal uterine and vaginal bleeding, unspecified: Secondary | ICD-10-CM

## 2023-11-12 DIAGNOSIS — N938 Other specified abnormal uterine and vaginal bleeding: Secondary | ICD-10-CM | POA: Diagnosis not present

## 2023-11-12 DIAGNOSIS — R102 Pelvic and perineal pain unspecified side: Secondary | ICD-10-CM

## 2023-11-12 DIAGNOSIS — M797 Fibromyalgia: Secondary | ICD-10-CM | POA: Insufficient documentation

## 2023-11-12 DIAGNOSIS — N888 Other specified noninflammatory disorders of cervix uteri: Secondary | ICD-10-CM | POA: Diagnosis not present

## 2023-11-12 DIAGNOSIS — Z01818 Encounter for other preprocedural examination: Secondary | ICD-10-CM

## 2023-11-12 DIAGNOSIS — N809 Endometriosis, unspecified: Secondary | ICD-10-CM | POA: Diagnosis not present

## 2023-11-12 DIAGNOSIS — J4530 Mild persistent asthma, uncomplicated: Secondary | ICD-10-CM | POA: Diagnosis not present

## 2023-11-12 DIAGNOSIS — S3141XA Laceration without foreign body of vagina and vulva, initial encounter: Secondary | ICD-10-CM | POA: Insufficient documentation

## 2023-11-12 DIAGNOSIS — D252 Subserosal leiomyoma of uterus: Secondary | ICD-10-CM

## 2023-11-12 DIAGNOSIS — X58XXXA Exposure to other specified factors, initial encounter: Secondary | ICD-10-CM | POA: Insufficient documentation

## 2023-11-12 HISTORY — DX: Other chronic pain: G89.29

## 2023-11-12 HISTORY — DX: Iron deficiency anemia, unspecified: D50.9

## 2023-11-12 HISTORY — PX: ROBOTIC ASSISTED LAPAROSCOPIC OVARIAN CYSTECTOMY: SHX6081

## 2023-11-12 HISTORY — DX: Abnormal uterine and vaginal bleeding, unspecified: N93.9

## 2023-11-12 HISTORY — DX: Unspecified osteoarthritis, unspecified site: M19.90

## 2023-11-12 HISTORY — DX: Pelvic and perineal pain unspecified side: R10.20

## 2023-11-12 HISTORY — DX: Palpitations: R00.2

## 2023-11-12 HISTORY — DX: Atrioventricular block, second degree: I44.1

## 2023-11-12 HISTORY — DX: Generalized anxiety disorder: F41.1

## 2023-11-12 HISTORY — PX: CYSTOSCOPY: SHX5120

## 2023-11-12 HISTORY — DX: Presence of spectacles and contact lenses: Z97.3

## 2023-11-12 HISTORY — DX: Mild persistent asthma, uncomplicated: J45.30

## 2023-11-12 HISTORY — DX: Major depressive disorder, single episode, unspecified: F32.9

## 2023-11-12 HISTORY — PX: PERINEAL LACERATION REPAIR: SHX5389

## 2023-11-12 HISTORY — PX: ROBOTIC ASSISTED TOTAL HYSTERECTOMY: SHX6085

## 2023-11-12 HISTORY — DX: Gastro-esophageal reflux disease without esophagitis: K21.9

## 2023-11-12 LAB — ABO/RH: ABO/RH(D): A POS

## 2023-11-12 LAB — POCT PREGNANCY, URINE: Preg Test, Ur: NEGATIVE

## 2023-11-12 SURGERY — HYSTERECTOMY, TOTAL, ROBOT-ASSISTED
Anesthesia: General | Site: Pelvis

## 2023-11-12 MED ORDER — METOPROLOL SUCCINATE ER 25 MG PO TB24
25.0000 mg | ORAL_TABLET | Freq: Every day | ORAL | Status: DC
  Administered 2023-11-12: 25 mg via ORAL
  Filled 2023-11-12: qty 1

## 2023-11-12 MED ORDER — DEXAMETHASONE SOD PHOSPHATE PF 10 MG/ML IJ SOLN: INTRAMUSCULAR | Status: DC | PRN

## 2023-11-12 MED ORDER — HYDROCORTISONE SOD SUC (PF) 250 MG IJ SOLR
INTRAMUSCULAR | Status: AC
  Filled 2023-11-12: qty 250

## 2023-11-12 MED ORDER — MIDAZOLAM HCL 2 MG/2ML IJ SOLN
INTRAMUSCULAR | Status: AC
  Filled 2023-11-12: qty 2

## 2023-11-12 MED ORDER — ACETAMINOPHEN 500 MG PO TABS
ORAL_TABLET | ORAL | Status: AC
  Filled 2023-11-12: qty 2

## 2023-11-12 MED ORDER — AMISULPRIDE (ANTIEMETIC) 5 MG/2ML IV SOLN: 10.0000 mg | Freq: Once | INTRAVENOUS | Status: DC | PRN

## 2023-11-12 MED ORDER — KETAMINE HCL 10 MG/ML IJ SOLN
INTRAMUSCULAR | Status: DC | PRN
Start: 2023-11-12 — End: 2023-11-12
  Administered 2023-11-12: 20 mg via INTRAVENOUS
  Administered 2023-11-12 (×2): 10 mg via INTRAVENOUS

## 2023-11-12 MED ORDER — GENTAMICIN SULFATE 40 MG/ML IJ SOLN
5.0000 mg/kg | INTRAVENOUS | Status: AC
  Administered 2023-11-12: 427.6 mg via INTRAVENOUS
  Filled 2023-11-12: qty 10.75

## 2023-11-12 MED ORDER — LACTATED RINGERS IV SOLN: INTRAVENOUS | Status: DC

## 2023-11-12 MED ORDER — HYDROMORPHONE HCL 1 MG/ML IJ SOLN
0.2500 mg | INTRAMUSCULAR | Status: DC | PRN
  Administered 2023-11-12 (×2): 0.5 mg via INTRAVENOUS

## 2023-11-12 MED ORDER — OXYCODONE HCL 5 MG PO TABS: 5.0000 mg | ORAL_TABLET | Freq: Once | ORAL | Status: DC | PRN

## 2023-11-12 MED ORDER — HYDROMORPHONE HCL 1 MG/ML IJ SOLN
INTRAMUSCULAR | Status: AC
  Filled 2023-11-12: qty 1

## 2023-11-12 MED ORDER — ROCURONIUM BROMIDE 10 MG/ML (PF) SYRINGE
PREFILLED_SYRINGE | INTRAVENOUS | Status: AC
  Filled 2023-11-12: qty 10

## 2023-11-12 MED ORDER — ACETAMINOPHEN 500 MG PO TABS
1000.0000 mg | ORAL_TABLET | Freq: Four times a day (QID) | ORAL | Status: DC
  Administered 2023-11-12 (×2): 1000 mg via ORAL
  Filled 2023-11-12 (×2): qty 2

## 2023-11-12 MED ORDER — CHLORHEXIDINE GLUCONATE 0.12 % MT SOLN
15.0000 mL | Freq: Once | OROMUCOSAL | Status: AC
  Administered 2023-11-12: 15 mL via OROMUCOSAL

## 2023-11-12 MED ORDER — PROPOFOL 10 MG/ML IV BOLUS
INTRAVENOUS | Status: AC
Start: 2023-11-12 — End: 2023-11-12
  Filled 2023-11-12: qty 20

## 2023-11-12 MED ORDER — KETOROLAC TROMETHAMINE 30 MG/ML IJ SOLN
30.0000 mg | Freq: Four times a day (QID) | INTRAMUSCULAR | Status: DC
  Administered 2023-11-12: 30 mg via INTRAVENOUS
  Filled 2023-11-12: qty 1

## 2023-11-12 MED ORDER — IBUPROFEN 600 MG PO TABS: 600.0000 mg | ORAL_TABLET | Freq: Four times a day (QID) | ORAL | Status: DC

## 2023-11-12 MED ORDER — OXYCODONE HCL 5 MG PO TABS
5.0000 mg | ORAL_TABLET | ORAL | 0 refills | Status: AC | PRN
Start: 2023-11-12 — End: ?
  Filled 2023-11-12: qty 16, 2d supply, fill #0

## 2023-11-12 MED ORDER — HYDROMORPHONE HCL 1 MG/ML IJ SOLN
INTRAMUSCULAR | Status: AC
  Filled 2023-11-12: qty 0.5

## 2023-11-12 MED ORDER — POVIDONE-IODINE 10 % EX SWAB
2.0000 | Freq: Once | CUTANEOUS | Status: AC
  Administered 2023-11-12: 2 via TOPICAL

## 2023-11-12 MED ORDER — LIDOCAINE 2% (20 MG/ML) 5 ML SYRINGE
INTRAMUSCULAR | Status: DC | PRN
  Administered 2023-11-12: 100 mg via INTRAVENOUS
  Administered 2023-11-12: 50 mg via INTRAVENOUS

## 2023-11-12 MED ORDER — DEXAMETHASONE SOD PHOSPHATE PF 10 MG/ML IJ SOLN
INTRAMUSCULAR | Status: DC | PRN
  Administered 2023-11-12: 10 mg via INTRAVENOUS

## 2023-11-12 MED ORDER — IBUPROFEN 600 MG PO TABS
600.0000 mg | ORAL_TABLET | Freq: Four times a day (QID) | ORAL | 1 refills | Status: AC
  Filled 2023-11-12: qty 60, 15d supply, fill #0

## 2023-11-12 MED ORDER — SERTRALINE HCL 50 MG PO TABS: 100.0000 mg | ORAL_TABLET | Freq: Every day | ORAL | Status: DC

## 2023-11-12 MED ORDER — POLYETHYLENE GLYCOL 3350 17 GM/SCOOP PO POWD
17.0000 g | Freq: Every day | ORAL | 0 refills | Status: AC | PRN
  Filled 2023-11-12: qty 238, 14d supply, fill #0

## 2023-11-12 MED ORDER — BUPIVACAINE HCL (PF) 0.5 % IJ SOLN
INTRAMUSCULAR | Status: DC | PRN
  Administered 2023-11-12: 10 mL

## 2023-11-12 MED ORDER — FLUORESCEIN SODIUM 10 % IV SOLN
INTRAVENOUS | Status: DC | PRN
  Administered 2023-11-12: .25 mL via INTRAVENOUS

## 2023-11-12 MED ORDER — 0.9 % SODIUM CHLORIDE (POUR BTL) OPTIME
TOPICAL | Status: DC | PRN
  Administered 2023-11-12: 1000 mL

## 2023-11-12 MED ORDER — BUPIVACAINE HCL (PF) 0.5 % IJ SOLN
INTRAMUSCULAR | Status: AC
  Filled 2023-11-12: qty 30

## 2023-11-12 MED ORDER — SODIUM CHLORIDE 0.9 % IR SOLN
Status: DC | PRN
  Administered 2023-11-12: 150 mL via INTRAVESICAL
  Administered 2023-11-12: 200 mL via INTRAVESICAL
  Administered 2023-11-12: 650 mL

## 2023-11-12 MED ORDER — PANTOPRAZOLE SODIUM 40 MG PO TBEC: 40.0000 mg | DELAYED_RELEASE_TABLET | Freq: Every day | ORAL | Status: DC | PRN

## 2023-11-12 MED ORDER — HYDROMORPHONE HCL 1 MG/ML IJ SOLN
INTRAMUSCULAR | Status: DC | PRN
  Administered 2023-11-12: .5 mg via INTRAVENOUS
  Administered 2023-11-12: 1 mg via INTRAVENOUS

## 2023-11-12 MED ORDER — FENTANYL CITRATE (PF) 250 MCG/5ML IJ SOLN
INTRAMUSCULAR | Status: DC | PRN
  Administered 2023-11-12 (×3): 25 ug via INTRAVENOUS
  Administered 2023-11-12: 50 ug via INTRAVENOUS
  Administered 2023-11-12: 25 ug via INTRAVENOUS
  Administered 2023-11-12: 100 ug via INTRAVENOUS

## 2023-11-12 MED ORDER — ROCURONIUM BROMIDE 10 MG/ML (PF) SYRINGE
PREFILLED_SYRINGE | INTRAVENOUS | Status: DC | PRN
  Administered 2023-11-12: 20 mg via INTRAVENOUS
  Administered 2023-11-12: 30 mg via INTRAVENOUS
  Administered 2023-11-12: 70 mg via INTRAVENOUS
  Administered 2023-11-12: 20 mg via INTRAVENOUS

## 2023-11-12 MED ORDER — PROPOFOL 10 MG/ML IV BOLUS
INTRAVENOUS | Status: DC | PRN
Start: 2023-11-12 — End: 2023-11-12
  Administered 2023-11-12: 20 mg via INTRAVENOUS
  Administered 2023-11-12: 200 mg via INTRAVENOUS

## 2023-11-12 MED ORDER — OXYCODONE HCL 5 MG/5ML PO SOLN: 5.0000 mg | Freq: Once | ORAL | Status: DC | PRN

## 2023-11-12 MED ORDER — OXYCODONE HCL 5 MG PO TABS
5.0000 mg | ORAL_TABLET | ORAL | Status: DC | PRN
  Administered 2023-11-12: 10 mg via ORAL
  Filled 2023-11-12: qty 2

## 2023-11-12 MED ORDER — FLUORESCEIN SODIUM 10 % IV SOLN
INTRAVENOUS | Status: AC
  Filled 2023-11-12: qty 5

## 2023-11-12 MED ORDER — ACETAMINOPHEN 500 MG PO TABS
1000.0000 mg | ORAL_TABLET | Freq: Four times a day (QID) | ORAL | 1 refills | Status: AC
  Filled 2023-11-12: qty 120, 15d supply, fill #0

## 2023-11-12 MED ORDER — BUPIVACAINE LIPOSOME 1.3 % IJ SUSP
INTRAMUSCULAR | Status: AC
  Filled 2023-11-12: qty 20

## 2023-11-12 MED ORDER — CLINDAMYCIN PHOSPHATE 900 MG/50ML IV SOLN
INTRAVENOUS | Status: DC
Start: 2023-11-12 — End: 2023-11-12
  Filled 2023-11-12: qty 50

## 2023-11-12 MED ORDER — LIDOCAINE 2% (20 MG/ML) 5 ML SYRINGE
INTRAMUSCULAR | Status: AC
  Filled 2023-11-12: qty 5

## 2023-11-12 MED ORDER — BUPIVACAINE LIPOSOME 1.3 % IJ SUSP
INTRAMUSCULAR | Status: DC | PRN
  Administered 2023-11-12: 40 mL

## 2023-11-12 MED ORDER — ONDANSETRON HCL 4 MG/2ML IJ SOLN
INTRAMUSCULAR | Status: DC | PRN
  Administered 2023-11-12: 4 mg via INTRAVENOUS

## 2023-11-12 MED ORDER — ONDANSETRON HCL 4 MG/2ML IJ SOLN
INTRAMUSCULAR | Status: AC
  Filled 2023-11-12: qty 2

## 2023-11-12 MED ORDER — KETAMINE HCL 50 MG/5ML IJ SOSY
PREFILLED_SYRINGE | INTRAMUSCULAR | Status: AC
  Filled 2023-11-12: qty 5

## 2023-11-12 MED ORDER — SUGAMMADEX SODIUM 200 MG/2ML IV SOLN
INTRAVENOUS | Status: DC | PRN
  Administered 2023-11-12: 300 mg via INTRAVENOUS

## 2023-11-12 MED ORDER — ONDANSETRON HCL 4 MG/2ML IJ SOLN: 4.0000 mg | Freq: Four times a day (QID) | INTRAMUSCULAR | Status: DC | PRN

## 2023-11-12 MED ORDER — KETOROLAC TROMETHAMINE 30 MG/ML IJ SOLN
INTRAMUSCULAR | Status: AC
  Filled 2023-11-12: qty 1

## 2023-11-12 MED ORDER — MIDAZOLAM HCL (PF) 2 MG/2ML IJ SOLN
INTRAMUSCULAR | Status: DC | PRN
  Administered 2023-11-12: 2 mg via INTRAVENOUS

## 2023-11-12 MED ORDER — SCOPOLAMINE 1 MG/3DAYS TD PT72
1.0000 | MEDICATED_PATCH | TRANSDERMAL | Status: DC
  Administered 2023-11-12: 1 mg via TRANSDERMAL

## 2023-11-12 MED ORDER — FENTANYL CITRATE (PF) 250 MCG/5ML IJ SOLN
INTRAMUSCULAR | Status: AC
  Filled 2023-11-12: qty 5

## 2023-11-12 MED ORDER — SCOPOLAMINE 1 MG/3DAYS TD PT72
MEDICATED_PATCH | TRANSDERMAL | Status: AC
  Filled 2023-11-12: qty 1

## 2023-11-12 MED ORDER — KETOROLAC TROMETHAMINE 30 MG/ML IJ SOLN
INTRAMUSCULAR | Status: DC | PRN
  Administered 2023-11-12: 30 mg via INTRAVENOUS

## 2023-11-12 MED ORDER — ALBUTEROL SULFATE (2.5 MG/3ML) 0.083% IN NEBU
3.0000 mL | INHALATION_SOLUTION | RESPIRATORY_TRACT | Status: DC | PRN
Start: 2023-11-12 — End: 2023-11-13

## 2023-11-12 MED ORDER — HYDROMORPHONE HCL 1 MG/ML IJ SOLN
0.2000 mg | INTRAMUSCULAR | Status: DC | PRN
  Administered 2023-11-12 (×3): 0.4 mg via INTRAVENOUS
  Filled 2023-11-12 (×3): qty 0.5

## 2023-11-12 MED ORDER — POLYETHYLENE GLYCOL 3350 17 G PO PACK: 17.0000 g | PACK | Freq: Every day | ORAL | Status: DC | PRN

## 2023-11-12 MED ORDER — PROPOFOL 10 MG/ML IV BOLUS
INTRAVENOUS | Status: AC
  Filled 2023-11-12: qty 20

## 2023-11-12 MED ORDER — GABAPENTIN 300 MG PO CAPS: 300.0000 mg | ORAL_CAPSULE | Freq: Every day | ORAL | Status: DC

## 2023-11-12 MED ORDER — ACETAMINOPHEN 500 MG PO TABS
1000.0000 mg | ORAL_TABLET | ORAL | Status: AC
  Administered 2023-11-12: 1000 mg via ORAL

## 2023-11-12 MED ORDER — ONDANSETRON HCL 4 MG PO TABS
4.0000 mg | ORAL_TABLET | Freq: Four times a day (QID) | ORAL | Status: DC | PRN
  Administered 2023-11-12: 4 mg via ORAL
  Filled 2023-11-12: qty 1

## 2023-11-12 MED ORDER — CLINDAMYCIN PHOSPHATE 900 MG/50ML IV SOLN
900.0000 mg | INTRAVENOUS | Status: AC
  Administered 2023-11-12: 900 mg via INTRAVENOUS
  Filled 2023-11-12: qty 50

## 2023-11-12 MED ORDER — CHLORHEXIDINE GLUCONATE 0.12 % MT SOLN
OROMUCOSAL | Status: AC
  Filled 2023-11-12: qty 15

## 2023-11-12 MED ORDER — ORAL CARE MOUTH RINSE: 15.0000 mL | Freq: Once | OROMUCOSAL | Status: AC

## 2023-11-12 SURGICAL SUPPLY — 63 items
BARRIER ADHS 3X4 INTERCEED (GAUZE/BANDAGES/DRESSINGS) IMPLANT
CHLORAPREP W/TINT 26 (MISCELLANEOUS) ×5 IMPLANT
COVER BACK TABLE 60X90IN (DRAPES) ×5 IMPLANT
COVER TIP SHEARS 8 DVNC (MISCELLANEOUS) ×5 IMPLANT
DEFOGGER SCOPE WARM SEASHARP (MISCELLANEOUS) ×5 IMPLANT
DERMABOND ADVANCED .7 DNX12 (GAUZE/BANDAGES/DRESSINGS) ×5 IMPLANT
DRAPE ARM DVNC X/XI (DISPOSABLE) ×20 IMPLANT
DRAPE COLUMN DVNC XI (DISPOSABLE) ×5 IMPLANT
DRAPE SURG IRRIG POUCH 19X23 (DRAPES) ×5 IMPLANT
DRAPE UTILITY XL STRL (DRAPES) ×5 IMPLANT
DRIVER NDL MEGA SUTCUT DVNCXI (INSTRUMENTS) ×4 IMPLANT
DRIVER NDLE MEGA SUTCUT DVNCXI (INSTRUMENTS) ×5 IMPLANT
ELECTRODE REM PT RTRN 9FT ADLT (ELECTROSURGICAL) ×5 IMPLANT
FORCEPS BPLR FENES DVNC XI (FORCEP) ×5 IMPLANT
FORCEPS PROGRASP DVNC XI (FORCEP) ×5 IMPLANT
GAUZE 4X4 16PLY ~~LOC~~+RFID DBL (SPONGE) IMPLANT
GLOVE BIOGEL PI IND STRL 7.0 (GLOVE) ×15 IMPLANT
GLOVE ECLIPSE 7.0 STRL STRAW (GLOVE) ×15 IMPLANT
GOWN STRL REUS W/ TWL XL LVL3 (GOWN DISPOSABLE) ×15 IMPLANT
HIBICLENS CHG 4% 4OZ BTL (MISCELLANEOUS) ×10 IMPLANT
HOLDER FOLEY CATH W/STRAP (MISCELLANEOUS) ×5 IMPLANT
IRRIGATION SUCT STRKRFLW 2 WTP (MISCELLANEOUS) ×5 IMPLANT
KIT PINK PAD W/HEAD ARM REST (MISCELLANEOUS) ×5 IMPLANT
KIT TURNOVER KIT B (KITS) ×5 IMPLANT
LEGGING LITHOTOMY PAIR STRL (DRAPES) ×5 IMPLANT
MANIFOLD NEPTUNE II (INSTRUMENTS) IMPLANT
NDL INSUFFLATION 14GA 120MM (NEEDLE) IMPLANT
NEEDLE INSUFFLATION 14GA 120MM (NEEDLE) IMPLANT
OBTURATOR OPTICALSTD 8 DVNC (TROCAR) ×5 IMPLANT
OCCLUDER COLPOPNEUMO (BALLOONS) IMPLANT
PACK ROBOT WH (CUSTOM PROCEDURE TRAY) ×5 IMPLANT
PAD OB MATERNITY 11 LF (PERSONAL CARE ITEMS) ×5 IMPLANT
PAD POSITIONING PINK XL (MISCELLANEOUS) ×5 IMPLANT
POWDER SURGICEL 3.0 GRAM (HEMOSTASIS) IMPLANT
RUMI II 3.0CM BLUE KOH-EFFICIE (DISPOSABLE) IMPLANT
RUMI II GYRUS 2.5CM BLUE (DISPOSABLE) IMPLANT
RUMI II GYRUS 3.5CM BLUE (DISPOSABLE) IMPLANT
RUMI II GYRUS 4.0CM BLUE (DISPOSABLE) IMPLANT
SCISSORS MNPLR CVD DVNC XI (INSTRUMENTS) ×5 IMPLANT
SEAL UNIV 5-12 XI (MISCELLANEOUS) ×20 IMPLANT
SEALER VESSEL EXT DVNC XI (MISCELLANEOUS) ×5 IMPLANT
SET CYSTO IRRIGATION (SET/KITS/TRAYS/PACK) ×5 IMPLANT
SET TRI-LUMEN FLTR TB AIRSEAL (TUBING) ×5 IMPLANT
SET TUBE SMOKE EVAC HIGH FLOW (TUBING) IMPLANT
SOLN 0.9% NACL POUR BTL 1000ML (IV SOLUTION) ×5 IMPLANT
SPIKE FLUID TRANSFER (MISCELLANEOUS) ×5 IMPLANT
SUT VIC AB 0 CT1 27XBRD ANBCTR (SUTURE) IMPLANT
SUT VIC AB 2-0 CT1 36 (SUTURE) IMPLANT
SUT VIC AB 3-0 SH 27X BRD (SUTURE) IMPLANT
SUT VIC AB 4-0 PS2 18 (SUTURE) ×10 IMPLANT
SUT VLOC 180 0 9IN GS21 (SUTURE) ×5 IMPLANT
SYSTEM BAG RETRIEVAL 10MM (BASKET) IMPLANT
TIP ENDOSCOPIC SURGICEL (TIP) IMPLANT
TIP RUMI ORANGE 6.7MMX12CM (TIP) IMPLANT
TIP UTERINE 5.1X6CM LAV DISP (MISCELLANEOUS) IMPLANT
TIP UTERINE 6.7X10CM GRN DISP (MISCELLANEOUS) IMPLANT
TIP UTERINE 6.7X6CM WHT DISP (MISCELLANEOUS) IMPLANT
TIP UTERINE 6.7X8CM BLUE DISP (MISCELLANEOUS) IMPLANT
TOWEL GREEN STERILE (TOWEL DISPOSABLE) ×5 IMPLANT
TRAY FOLEY W/BAG SLVR 14FR (SET/KITS/TRAYS/PACK) ×5 IMPLANT
TROCAR 5M 150ML BLDLS (TROCAR) IMPLANT
TROCAR PORT AIRSEAL 8X120 (TROCAR) ×5 IMPLANT
UNDERPAD 30X36 HEAVY ABSORB (UNDERPADS AND DIAPERS) ×5 IMPLANT

## 2023-11-12 NOTE — Op Note (Signed)
 Carol Marquez PROCEDURE DATE: 11/12/2023  PREOPERATIVE DIAGNOSIS: dysmenorrhea, abnormal uterine bleeding  POSTOPERATIVE DIAGNOSIS: dysmenorrhea, abnormal uterine bleeding PROCEDURE:    robotic assisted total laparoscopic hysterectomy, bilateral salpingectomy, left ovarian cystectomy, peritoneal biopsies, cystoscopy and vaginal wall laceration repair, laparoscopic TAP block SURGEON: Carter Quarry, MD ASSISTANT:  Jorene Moats, PA    An experienced assistant was required given the standard of surgical care given the complexity of the case.  This assistant was needed for exposure, dissection, suctioning, retraction, instrument exchange, and for overall help during the procedure.  INDICATIONS: 33 y.o. G0P0000 with dysmenorrhea and AUB.  Risks of surgery were discussed with the patient including but not limited to: bleeding which may require transfusion; infection which may require antibiotics; injury to surrounding organs; need for additional procedures including laparotomy;  and other postoperative/anesthesia complications. Written informed consent was obtained.    FINDINGS:  Normal external genitalia, 8 wk size mobile uterus with Normal contours.  Laparoscopically: normal upper abdominal survey, normal sized uterus with slightly edeamatous vesicouterine fold, scattered erythematous lesions on posterior peritoneum, small borwn lesions in midline of posterior cul de sac just superior to the rectum, normal bilateral fallopian tubes, multicystic bilateral ovaries, bilateral ureters seen, normal anterior cul de sac, extended adhesions of sigmoid to the pelvic side wall Cystoscopically: normal bladder wall without apparent injury, bilateral ureteral orifices, fluorescein-stained urine from bilateral ureteral orifices    ANESTHESIA: General, paracervical block INTRAVENOUS FLUIDS:  800 ml of LR ESTIMATED BLOOD LOSS:  100 ml URINE OUTPUT: 100 ml SPECIMENS: uterus cervix with bilateral fallopian  tubes, posterior cul de sac biopsy, posterior peritoneum and left ovarian cyst wall  COMPLICATIONS:  None immediate.   The risks, benefits, and alternatives of surgery were explained, understood, and accepted. Consents were signed. All questions were answered. She was taken to the operating room and general anesthesia was applied without complication. She was placed in the dorsal lithotomy position and her abdomen and vagina were prepped and draped after she had been carefully positioned on the table. A bimanual exam revealed a 8 week size uterus that was mobile. Her adnexa were not enlarged. A Foley catheter was placed and it drained clear throughout the case. A speculum was placed and the cervix visualized. The cervix was measured and the uterus was sounded to 9 cm. A Rumi uterine manipulator was placed without difficulty.  Gloves were changed and attention was turned to the abdomen. All incisions were infiltrated with local anesthetic. An 8mm incision was made in the LUQ and an optiview airseal trocar was introduced into the abdomen. The Entry was confirmed with low opening intraabdominal pressure and visualization and the abdomen was then insufflated. After good pneumoperitoneum was established, the abdomen was surveyed including the upper abdomen.She was placed in Trendelenburg position and ports were placed in appropriate positions on her abdomen to allow maximum exposure during the robotic case. Specifically, trocars were placed in the umbilicus, in the LLQ and RLQ.  These were all placed under direct laparoscopic visualization after infiltration with local anesthetic. The robot was docked and I proceeded with a robotic portion of the case.  The pelvis was inspected and the uterus was found to have the above findings.  The fallopian tubes and ovaries were found to be normal. The left mesosalpinx was serially cauterized and divided. The utero-ovarian ligament was cauterized and divided and the round  ligament subsequently divided. The ureter was identified and noted to be low in the pelvis. The leaves of the broad ligament were  separated and the anterior leaf divided down to the level of Koh ring. The posterior leaf was divided and the uterine vessesl skeletonized. The uterine vessels were then cauterized and divided. Attention was turned to the contralateral side and the same was carried out. The bladder flap was divided down to the pubocervical fascia. The colpotomy incision was extended circumferentially, following the blue outline of the Rumi manipulator. All pedicles were hemostatic.  The uterus was removed from the vagina with the fallopian tube segments. The vaginal cuff was closed with v-lock suture.  Excellent hemostasis was noted throughout. The pelvis was irrigated. The intraabdominal pressure was lowered assess hemostasis. After determining excellent hemostasis, the robot was undocked. At this point I performed cystoscopy. The cystoscopy revealed fluorescein ejection from both ureters.   At this time there was continuous bleeding noted from the vagina. A speculum was placed and a laceration ~4cm in length and 2cm in height was visualized and bleeding. The deepest and distal portion was closed with 2-0 vicryl with an initial figure of 8 followed by running locked suture. The remainder of the laceration was closed with 3-0 viryl ina running and intermittently locked fashion. The vagina was inspected and found to be hemostatic. The cuff was palpated and found to be intact. The cystoscopy was repeated after prepping the urethra with betadine and the bladder wall was intact, there was fluorescein stained urine from bilateral ureteral orifices and the urethra noted to be intact. Attention was returned to the abdomen and the LLQ incision was opened. A 5mm trocar was used to enter the abdomen via the umbilicus and intraabdominal entry was confirmed with low opening pressure and visualization of the  intra-abdominal cavity. The patient was placed in Trendelenburg and the cuff inspected and noted to be intact without evidence of injury, disruption or bleeding. All instruments removed and the incisions closed with 4-0 vicryl. The patient was then extubated and taken to recovery in stable condition.   Sponge, lap and needle counts were correct x 2.    Carter Quarry, MD Minimally Invasive Gynecologic Surgery  Obstetrics and Gynecology, [facility] for [facility], [facility] 11/12/2023

## 2023-11-12 NOTE — Discharge Summary (Signed)
 Gynecology Physician Postoperative Discharge Summary  Patient ID: Carol Marquez MRN: [id number] DOB/AGE: 1990/09/11 33 y.o.  Admit Date: 11/12/2023 Discharge Date: 11/12/2023  Preoperative Diagnoses: dysmenorrhea, abnormal vaginal bleeding  Procedures: Procedure(s) (LRB): HYSTERECTOMY, TOTAL, ROBOT-ASSISTED AND BILATERAL SALPINGECTOMY (N/A) CYSTOSCOPY (N/A) EXCISION, CYST, OVARY, ROBOT-ASSISTED, LAPAROSCOPIC (Left) SUTURE REPAIR, LACERATION, vaginal wall  Hospital Course:  Carol Marquez is a 33 y.o. G0P0000  admitted for scheduled surgery.  She underwent the procedures as mentioned above, her operation was uncomplicated. For further details about surgery, please refer to the operative report. Patient had an uncomplicated postoperative course. By time of discharge on POD#0, her pain was controlled with pain medications; she was ambulating, voiding without difficulty, tolerating regular diet and passing flatus. She was deemed stable for discharge to home.   Significant Labs:    Latest Ref Rng & Units 11/08/2023   11:30 AM 07/07/2023   12:48 AM 07/04/2023    3:32 PM  CBC  WBC 4.0 - 10.5 K/uL 7.6  8.5  8.2   Hemoglobin 12.0 - 15.0 g/dL 84.6  85.6  85.9   Hematocrit 36.0 - 46.0 % 46.0  43.4  41.4   Platelets 150 - 400 K/uL 328  278  266.0     Discharge Exam: Blood pressure 115/65, pulse 68, temperature 97.9 F (36.6 C), temperature source Oral, resp. rate 17, height 5' 10 (1.778 m), weight 111.1 kg, last menstrual period 11/03/2023, SpO2 98%. General appearance: alert and no distress  Resp: clear to auscultation bilaterally  Cardio: regular rate and rhythm  GI: soft, non-tender; bowel sounds normal; no masses, no organomegaly.  Incision: C/D/I, no erythema, no drainage noted Extremities: extremities normal, atraumatic, no cyanosis or edema   Discharged Condition: Stable  Disposition:    Allergies as of 11/12/2023       Reactions   Amoxicillin  Anaphylaxis   Bee Venom  Anaphylaxis, Swelling   Penicillins Anaphylaxis   Shellfish Allergy Anaphylaxis, Hives   PT STATED ONLY CRABS Per pt iodine ok   Fire Ant Swelling   Codeine Nausea Only   Morphine  Nausea Only        Medication List     PAUSE taking these medications    Midol  Max St Menstrual 500-60-15 MG Tabs Wait to take this until: November 26, 2023 Generic drug: Acetaminophen -Caff-Pyrilamine Take by mouth as needed.       TAKE these medications    Acetaminophen  Extra Strength 500 MG Tabs Take 2 tablets (1,000 mg total) by mouth every 6 (six) hours.   albuterol  108 (90 Base) MCG/ACT inhaler Commonly known as: VENTOLIN  HFA Inhale 2 puffs into the lungs every 4 (four) hours as needed for wheezing or shortness of breath.   azelastine  0.1 % nasal spray Commonly known as: ASTELIN  Place 2 sprays into both nostrils 2 (two) times daily. Use in each nostril as directed   drospirenone -ethinyl estradiol  3-0.03 MG tablet Commonly known as: Yasmin  28 Take 1 tablet by mouth daily. What changed: when to take this   EPINEPHrine  0.3 mg/0.3 mL Soaj injection Commonly known as: EPI-PEN Inject 0.3 mg into the muscle as needed for anaphylaxis.   fluticasone  110 MCG/ACT inhaler Commonly known as: Flovent  HFA Inhale 2 puffs into the lungs in the morning and at bedtime. Rinse mouth out after use.   fluticasone  50 MCG/ACT nasal spray Commonly known as: FLONASE  Place 2 sprays into both nostrils daily. What changed:  when to take this reasons to take this   gabapentin  300 MG capsule Commonly  known as: NEURONTIN  Take 1 capsule (300 mg total) by mouth 3 (three) times daily. What changed: when to take this   ibuprofen  600 MG tablet Commonly known as: ADVIL  Take 1 tablet (600 mg total) by mouth every 6 (six) hours. Start taking on: November 13, 2023   metoprolol  succinate 25 MG 24 hr tablet Commonly known as: Toprol  XL Take 1 tablet (25 mg total) by mouth at bedtime.   montelukast  10 MG  tablet Commonly known as: SINGULAIR  TAKE 1 TABLET(10 MG) BY MOUTH AT BEDTIME   MULTIVITAMIN/IRON PO Take 2 each by mouth at bedtime. Gummies   ondansetron  4 MG disintegrating tablet Commonly known as: ZOFRAN -ODT Take 1 tablet (4 mg total) by mouth every 8 (eight) hours as needed for nausea or vomiting.   oxyCODONE  5 MG immediate release tablet Commonly known as: Oxy IR/ROXICODONE  Take 1-2 tablets (5-10 mg total) by mouth every 4 (four) hours as needed for moderate pain (pain score 4-6).   pantoprazole  40 MG tablet Commonly known as: PROTONIX  Take 1 tablet (40 mg total) by mouth daily. What changed:  when to take this reasons to take this   polyethylene glycol powder 17 GM/SCOOP powder Commonly known as: GLYCOLAX/MIRALAX Dissolve 1 capful (17g) in 4-8 ounces of liquid and take by mouth daily as needed for mild constipation.   sertraline  100 MG tablet Commonly known as: ZOLOFT  Take 1 tablet (100 mg total) by mouth daily. What changed: when to take this       No future appointments.   Total discharge time: 15 minutes   Signed:  Carter Quarry, MD Minimally Invasive Gynecologic Surgery and Chronic Pelvic Pain Specialist Obstetrics and Gynecology, [facility] for [facility], [facility] 11/12/23

## 2023-11-12 NOTE — Anesthesia Procedure Notes (Signed)
 Procedure Name: Intubation Date/Time: 11/12/2023 7:33 AM  Performed by: Dianne Burnard RAMAN, CRNAPre-anesthesia Checklist: Patient identified, Emergency Drugs available, Suction available and Patient being monitored Patient Re-evaluated:Patient Re-evaluated prior to induction Oxygen Delivery Method: Circle system utilized Preoxygenation: Pre-oxygenation with 100% oxygen Induction Type: IV induction Ventilation: Mask ventilation without difficulty Laryngoscope Size: Miller and 3 Grade View: Grade I Tube type: Oral Tube size: 7.5 mm Number of attempts: 1 Airway Equipment and Method: Stylet and Oral airway Placement Confirmation: ETT inserted through vocal cords under direct vision, positive ETCO2 and breath sounds checked- equal and bilateral Secured at: 22 cm Tube secured with: Tape Dental Injury: Teeth and Oropharynx as per pre-operative assessment

## 2023-11-12 NOTE — Transfer of Care (Signed)
 Immediate Anesthesia Transfer of Care Note  Patient: Carol Marquez  Procedure(s) Performed: HYSTERECTOMY, TOTAL, ROBOT-ASSISTED AND BILATERAL SALPINGECTOMY (Pelvis) CYSTOSCOPY (Bladder) EXCISION, CYST, OVARY, ROBOT-ASSISTED, LAPAROSCOPIC (Left: Pelvis) SUTURE REPAIR, LACERATION, vaginal wall (Vagina )  Patient Location: PACU  Anesthesia Type:General  Level of Consciousness: awake and alert   Airway & Oxygen Therapy: Patient Spontanous Breathing and Patient connected to nasal cannula oxygen  Post-op Assessment: Report given to RN, Post -op Vital signs reviewed and stable, and Patient moving all extremities X 4  Post vital signs: Reviewed and stable  Last Vitals:  Vitals Value Taken Time  BP 148/64 11/12/23 10:30  Temp 37 C 11/12/23 10:17  Pulse 63 11/12/23 10:43  Resp 11 11/12/23 10:43  SpO2 94 % 11/12/23 10:43  Vitals shown include unfiled device data.  Last Pain:  Vitals:   11/12/23 9366  TempSrc: Oral  PainSc: 5       Patients Stated Pain Goal: 5 (11/12/23 9366)  Complications: No notable events documented.

## 2023-11-12 NOTE — H&P (Signed)
 OB/GYN Pre-Op History and Physical  Carol Marquez is a 32 y.o. G0P0000 presenting for definitive surgical management of dysmenorrhea and heavy menstrual bleeding. No changes in medical history since last evalauted.       Past Medical History:  Diagnosis Date   Abnormal uterine bleeding (AUB)    Arthritis    Chronic fatigue syndrome 2021   Chronic pelvic pain in female    Endometriosis    Fibromyalgia    GAD (generalized anxiety disorder)    GERD (gastroesophageal reflux disease)    Heart block AV second degree    cardiology--- dr croitoru per event moniotr 10-12-2022--  AV type 1 present;   IDA (iron deficiency anemia)    MDD (major depressive disorder)    Mild persistent asthma, uncomplicated    followed by pcp;   Diagnosed age 89 or 33 yo.;  (11-05-2023  pt stated last flare-up , spring 2025)   Palpitations    (11-05-2023  pt stated controlled w/ toprol  without breakthrough) cardiologist---  dr m. croitoru;   event monitor 10-12-2022  NS/ second degree AV Type 1 present , Isolated PACs/ PVCs;  normal echo 11-29-2022   Wears glasses     Past Surgical History:  Procedure Laterality Date   CHOLECYSTECTOMY N/A 09/01/2019   Procedure: LAPAROSCOPIC CHOLECYSTECTOMY;  Surgeon: Vanderbilt Ned, MD;  Location: [facility] OR;  Service: General;  Laterality: N/A;   COLONOSCOPY WITH PROPOFOL   07/19/2023   dr avram   WISDOM TOOTH EXTRACTION  2011    OB History  Gravida Para Term Preterm AB Living  0 0 0 0 0 0  SAB IAB Ectopic Multiple Live Births  0 0 0 0 0    Social History   Socioeconomic History   Marital status: Single    Spouse name: Not on file   Number of children: 0   Years of education: Not on file   Highest education level: Bachelor's degree (e.g., BA, AB, BS)  Occupational History   Occupation: Corporate Treasurer  Tobacco Use   Smoking status: Never   Smokeless tobacco: Never  Vaping Use   Vaping status: Never Used  Substance and Sexual Activity   Alcohol use:  No   Drug use: Never   Sexual activity: Not Currently    Birth control/protection: Condom, Pill  Other Topics Concern   Not on file  Social History Narrative   Degree in Psychology from [employer]   LIves with her mother.   Social Drivers of Health   Financial Resource Strain: Medium Risk (06/27/2022)   Overall Financial Resource Strain (CARDIA)    Difficulty of Paying Living Expenses: Somewhat hard  Food Insecurity: Food Insecurity Present (09/26/2023)   Hunger Vital Sign    Worried About Running Out of Food in the Last Year: Sometimes true    Ran Out of Food in the Last Year: Sometimes true  Transportation Needs: No Transportation Needs (09/26/2023)   PRAPARE - Administrator, Civil Service (Medical): No    Lack of Transportation (Non-Medical): No  Physical Activity: Sufficiently Active (06/27/2022)   Exercise Vital Sign    Days of Exercise per Week: 4 days    Minutes of Exercise per Session: 90 min  Stress: Stress Concern Present (06/27/2022)   [employer] of Occupational Health - Occupational Stress Questionnaire    Feeling of Stress : Very much  Social Connections: Socially Isolated (06/27/2022)   Social Connection and Isolation Panel    Frequency of Communication with Friends and  Family: Three times a week    Frequency of Social Gatherings with Friends and Family: Patient declined    Attends Religious Services: Never    Database Administrator or Organizations: No    Attends Engineer, Structural: Not on file    Marital Status: Never married    Family History  Problem Relation Age of Onset   Bipolar disorder Mother        ?Schizophrenia   Colon polyps Mother    Colon polyps Father    Colon cancer Maternal Grandmother    Colon cancer Maternal Great Grandparent    Stomach cancer Neg Hx    Rectal cancer Neg Hx    Esophageal cancer Neg Hx     Medications Prior to Admission  Medication Sig Dispense Refill Last Dose/Taking    Acetaminophen -Caff-Pyrilamine (MIDOL  MAX ST MENSTRUAL) 500-60-15 MG TABS Take by mouth as needed.   Past Month   albuterol  (VENTOLIN  HFA) 108 (90 Base) MCG/ACT inhaler Inhale 2 puffs into the lungs every 4 (four) hours as needed for wheezing or shortness of breath. 18 g 2 Unknown   drospirenone -ethinyl estradiol  (YASMIN  28) 3-0.03 MG tablet Take 1 tablet by mouth daily. (Patient taking differently: Take 1 tablet by mouth at bedtime.) 28 tablet 11 11/11/2023   EPINEPHrine  0.3 mg/0.3 mL IJ SOAJ injection Inject 0.3 mg into the muscle as needed for anaphylaxis. 1 each 0 Taking As Needed   fluticasone  (FLOVENT  HFA) 110 MCG/ACT inhaler Inhale 2 puffs into the lungs in the morning and at bedtime. Rinse mouth out after use. (Patient taking differently: Inhale 2 puffs into the lungs in the morning and at bedtime. Rinse mouth out after use.) 1 each 1 11/12/2023 Morning   gabapentin  (NEURONTIN ) 300 MG capsule Take 1 capsule (300 mg total) by mouth 3 (three) times daily. (Patient taking differently: Take 300 mg by mouth at bedtime.) 90 capsule 0 Past Week   metoprolol  succinate (TOPROL  XL) 25 MG 24 hr tablet Take 1 tablet (25 mg total) by mouth at bedtime. 90 tablet 3 11/11/2023 at  6:00 PM   Multiple Vitamins-Iron (MULTIVITAMIN/IRON PO) Take 2 each by mouth at bedtime. Gummies   Past Month   ondansetron  (ZOFRAN -ODT) 4 MG disintegrating tablet Take 1 tablet (4 mg total) by mouth every 8 (eight) hours as needed for nausea or vomiting. 20 tablet 0 11/11/2023   pantoprazole  (PROTONIX ) 40 MG tablet Take 1 tablet (40 mg total) by mouth daily. (Patient taking differently: Take 40 mg by mouth daily as needed.) 30 tablet 3 11/12/2023 at  4:30 AM   sertraline  (ZOLOFT ) 100 MG tablet Take 1 tablet (100 mg total) by mouth daily. (Patient taking differently: Take 100 mg by mouth at bedtime.) 90 tablet 3 11/11/2023   azelastine  (ASTELIN ) 0.1 % nasal spray Place 2 sprays into both nostrils 2 (two) times daily. Use in each nostril as  directed (Patient not taking: Reported on 11/05/2023) 30 mL 12 More than a month   fluticasone  (FLONASE ) 50 MCG/ACT nasal spray Place 2 sprays into both nostrils daily. (Patient taking differently: Place 2 sprays into both nostrils daily as needed.) 16 g 0 More than a month   montelukast  (SINGULAIR ) 10 MG tablet TAKE 1 TABLET(10 MG) BY MOUTH AT BEDTIME (Patient not taking: Reported on 11/12/2023) 90 tablet 2 Not Taking    Allergies  Allergen Reactions   Amoxicillin  Anaphylaxis   Bee Venom Anaphylaxis and Swelling   Penicillins Anaphylaxis   Shellfish Allergy Anaphylaxis and Hives  PT STATED ONLY CRABS Per pt iodine ok   Fire Ant Swelling   Codeine Nausea Only   Morphine  Nausea Only    Review of Systems: Negative except for what is mentioned in HPI.     Physical Exam: BP 138/87   Pulse 76   Temp 98.3 F (36.8 C) (Oral)   Resp 17   Ht 5' 10 (1.778 m)   Wt 111.1 kg   LMP 11/03/2023   SpO2 96%   BMI 35.15 kg/m  CONSTITUTIONAL: Well-developed, well-nourished and in no acute distress.  HENT:  Normocephalic, atraumatic, External right and left ear normal. Oropharynx is clear and moist EYES: Conjunctivae and EOM are normal. Pupils are equal, round, and reactive to light. No scleral icterus.  NECK: Normal range of motion, supple, no masses SKIN: Skin is warm and dry. No rash noted. Not diaphoretic. No erythema. No pallor. NEUROLGIC: Alert and oriented to person, place, and time. Normal reflexes, muscle tone coordination. No cranial nerve deficit noted. PSYCHIATRIC: Normal mood and affect. Normal behavior. Normal judgment and thought content. RESPIRATORY: Normal effort PELVIC: Deferred   Pertinent Labs/Studies:   Results for orders placed or performed during the hospital encounter of 11/12/23 (from the past 72 hours)  Pregnancy, urine POC     Status: None   Collection Time: 11/12/23  6:12 AM  Result Value Ref Range   Preg Test, Ur NEGATIVE NEGATIVE    Comment:        THE  SENSITIVITY OF THIS METHODOLOGY IS >20 mIU/mL.        Assessment and Plan :RAVEENA HEBDON is a 32 y.o. G0P0000 here for RA-TLH, BS, cysto.   Patient desires surgical management with chronic pelvic pain/dysmenorrhea and heavy menstrual bleeding.  The risks of surgery were discussed in detail with the patient including but not limited to: bleeding which may require transfusion or reoperation; infection which may require prolonged hospitalization or re-hospitalization and antibiotic therapy; injury to bowel, bladder, ureters and major vessels or other surrounding organs which may lead to other procedures; formation of adhesions; need for additional procedures including laparotomy or subsequent procedures secondary to intraoperative injury or abnormal pathology; thromboembolic phenomenon; incisional problems and other postoperative or anesthesia complications.  Patient was told that the likelihood that her condition and symptoms will be treated effectively with this surgical management was high; the postoperative expectations were also discussed in detail. The patient also understands the alternative treatment options which were discussed in full. All questions were answered.     Lerlene Treadwell, M.D. Minimally Invasive Gynecologic Surgery and Pelvic Pain Specialist Attending Obstetrician & Gynecologist, [facility] [employer], [facility]

## 2023-11-12 NOTE — Anesthesia Postprocedure Evaluation (Signed)
 Anesthesia Post Note  Patient: Carol Marquez  Procedure(s) Performed: HYSTERECTOMY, TOTAL, ROBOT-ASSISTED AND BILATERAL SALPINGECTOMY (Pelvis) CYSTOSCOPY (Bladder) EXCISION, CYST, OVARY, ROBOT-ASSISTED, LAPAROSCOPIC (Left: Pelvis) SUTURE REPAIR, LACERATION, vaginal wall (Vagina )     Patient location during evaluation: PACU Anesthesia Type: General Level of consciousness: awake Pain management: pain level controlled Vital Signs Assessment: post-procedure vital signs reviewed and stable Respiratory status: spontaneous breathing, nonlabored ventilation and respiratory function stable Cardiovascular status: blood pressure returned to baseline and stable Postop Assessment: no apparent nausea or vomiting Anesthetic complications: no   No notable events documented.  Last Vitals:  Vitals:   11/12/23 1045 11/12/23 1115  BP: 125/77 124/75  Pulse: 65 64  Resp: (!) 7 12  Temp:    SpO2: 96% 98%    Last Pain:  Vitals:   11/12/23 1106  TempSrc:   PainSc: 5                  Delon Aisha Arch

## 2023-11-12 NOTE — Discharge Instructions (Addendum)
 Post Op Hysterectomy Instructions Please read the instructions below. Refer to these instructions for the next few weeks. These instructions provide you with general information on caring for yourself after surgery. Your caregiver may also give you specific instructions. While your treatment has been planned according to the most current medical practices available, unavoidable problems sometimes happen. If you have any problems or questions after you leave, please call your caregiver.  HOME CARE INSTRUCTIONS Healing will take time. You will have discomfort, tenderness, swelling and bruising at the operative site for a couple of weeks. This is normal and will get better as time goes on.  Only take over-the-counter or prescription medicines for pain, discomfort or fever as directed by your caregiver.  Do not take aspirin. It can cause bleeding.  Do not drive when taking pain medication.  Follow your caregiver's advice regarding diet, exercise, lifting, driving and general activities.  Resume your usual diet as directed and allowed.  Get plenty of rest and sleep.  Do not douche, use tampons, or have sexual intercourse until your caregiver gives you permission. .  Take your temperature if you feel hot or flushed.  You may shower today when you get home.  No tub bath for one week.   Do not drink alcohol until you are not taking any narcotic pain medications.  Try to have someone home with you for a week or two to help with the household activities.   Be careful over the next two to three weeks with any activities at home that involve lifting, pushing, or pulling.  Listen to your body--if something feels uncomfortable to do, then don't do it. Make sure you and your family understands everything about your operation and recovery.  Walking up stairs is fine. Do not sign any legal documents until you feel normal again.  Keep all your follow-up appointments as recommended by your caregiver.   PLEASE CALL  THE OFFICE IF: There is swelling, redness or increasing pain in the wound area.  Pus is coming from the wound.  You notice a bad smell from the wound or surgical dressing.  You have pain, redness and swelling from the intravenous site.  The wound is breaking open (the edges are not staying together).   You develop pain or bleeding when you urinate.  You develop abnormal vaginal discharge.  You have any type of abnormal reaction or develop an allergy to your medication.  You need stronger pain medication for your pain   SEEK IMMEDIATE MEDICAL CARE: You develop a temperature of 100.5 or higher.  You develop abdominal pain.  You develop chest pain.  You develop shortness of breath.  You pass out.  You develop pain, swelling or redness of your leg.  You develop heavy vaginal bleeding with or without blood clots.   MEDICATIONS: Restart your regular medications BUT wait one week before restarting all vitamins and mineral supplements Use Motrin  800mg  every 8 hours for the next several days.   Take Tylenol  1000mg  every 8 hours for the next several days Use oxycodone  5 mg every 4-6 hours. Taking motrin  and tylenol  should help reduce how often you use oxycodone .  You may use an over the counter stool softener like Colace or Dulcolax to help with starting a bowel movement.  You can also use miralax (polyethylene glycol). Start the day after you go home.  Warm liquids, fluids, and ambulation help too.  If you have not had a bowel movement in four days, you need to  call the office.  Can continue taking gabapentin  as well, do not take at the same time as oxycodone . If additional gabapentin  needed, please contact the office

## 2023-11-12 NOTE — Brief Op Note (Signed)
 11/12/2023  10:04 AM  PATIENT:  Carol Marquez  33 y.o. female  PRE-OPERATIVE DIAGNOSIS:  AUB Pelvic pain  POST-OPERATIVE DIAGNOSIS:  AUBPelvic pain  PROCEDURE:  Procedure(s): HYSTERECTOMY, TOTAL, ROBOT-ASSISTED AND BILATERAL SALPINGECTOMY (N/A) CYSTOSCOPY (N/A) EXCISION, CYST, OVARY, ROBOT-ASSISTED, LAPAROSCOPIC (Left) SUTURE REPAIR, LACERATION, vaginal wall  SURGEON:  Surgeons and Role:    DEWAINE Jeralyn Crutch, MD - Primary  PHYSICIAN ASSISTANT: Jorene Moats, PA  ASSISTANTS: none   ANESTHESIA:   general, paracervical block, and laparoscopic TAP block  EBL:  30 mL   BLOOD ADMINISTERED:none  DRAINS: none   LOCAL MEDICATIONS USED:  BUPIVICAINE   SPECIMEN:  Source of Specimen:  uterus cervix and bilateral fallopian tubes; posterior peritoneal biopsy; posterior cul de sac biopsy, left ovarian cyst   DISPOSITION OF SPECIMEN:  PATHOLOGY  COUNTS:  YES  TOURNIQUET:  * No tourniquets in log *  DICTATION: .Note written in EPIC  PLAN OF CARE: extended recovery  PATIENT DISPOSITION:  PACU - hemodynamically stable.   Delay start of Pharmacological VTE agent (>24hrs) due to surgical blood loss or risk of bleeding: not applicable

## 2023-11-13 ENCOUNTER — Encounter (HOSPITAL_COMMUNITY): Payer: Self-pay | Admitting: Obstetrics and Gynecology

## 2023-11-14 LAB — SURGICAL PATHOLOGY

## 2023-11-15 ENCOUNTER — Ambulatory Visit: Payer: Self-pay | Admitting: Obstetrics and Gynecology

## 2023-11-29 ENCOUNTER — Ambulatory Visit: Admitting: Obstetrics and Gynecology

## 2023-11-29 ENCOUNTER — Other Ambulatory Visit: Payer: Self-pay

## 2023-11-29 VITALS — BP 117/73 | HR 86 | Wt 254.0 lb

## 2023-11-29 DIAGNOSIS — N809 Endometriosis, unspecified: Secondary | ICD-10-CM

## 2023-11-29 DIAGNOSIS — N939 Abnormal uterine and vaginal bleeding, unspecified: Secondary | ICD-10-CM

## 2023-11-29 DIAGNOSIS — Z09 Encounter for follow-up examination after completed treatment for conditions other than malignant neoplasm: Secondary | ICD-10-CM

## 2023-11-29 NOTE — Progress Notes (Signed)
   POSTOPERATIVE VISIT NOTE   Subjective:     Carol Marquez is a 33 y.o. G0P0000 who presents to the clinic 2.5 weeks status post robotic assisted total laparoscopic hysterectomy and cystoscopy for abnormal uterine bleeding, pelvic pain, and endometriosis. Eating a regular diet without difficulty. Bowel movements are normal. Pain is controlled with current analgesics. Medications being used: ibuprofen  (OTC). Incision: healing well - some itching and cramping around umbilicus. Had been avoiding scratching/scrubbing incisions as instructed Vaginal bleeding: light bleeding the first days after surgery and blood discharge after non-penetrative masturbation with some pain, stopped afterwards Resumed sexual acitivity: non-penetrative masturbation -  Brief sharp pain when voiding or having BM. Milky discharge noted briefly   The following portions of the patient's history were reviewed and updated as appropriate: allergies, current medications, past family history, past medical history, past social history, past surgical history, and problem list..   Review of Systems Pertinent items are noted in HPI.    Objective:    BP 117/73   Pulse 86   Wt 254 lb (115.2 kg)   LMP 11/03/2023   BMI 36.45 kg/m  General:  alert, cooperative, and no distress  Abdomen: soft, non-tender  Incision:   healing well, no drainage, no erythema, no hernia, no seroma, no swelling, dried adhesive removed from surrounding incision, no dehiscence, incision well approximated  Pelvic:   Exam deferred.    Pathology Results: FINAL MICROSCOPIC DIAGNOSIS:   A. OVARIAN CYST, LEFT, EXCISION:  - Consistent with partial sampling of hemorrhagic ovarian follicular  cyst wall.   B. UTERUS AND CERVIX, WITH BILATERAL FALLOPIAN TUBES, HYSTERECTOMY:  - Cervix with Nabothian cysts.  - Proliferative endometrium.  - Myometrium with subserosal leiomyoma measuring 1.3 cm.  - Bilateral fallopian tubes with fimbriated end.  -  Negative for malignancy.   C. CUL-DE-SAC, POSTERIOR, BIOPSY:  - Mesothelial-lined fibrous tissue with focal calcification.   D. PERITONEUM, POSTERIOR, BIOPSY:  - Endometriosis.    Assessment:   Doing well postoperatively. Operative findings again reviewed. Pathology report discussed.   Plan:    1. Postop check (Primary) Doing well, incisions healing well. Can remove remaining adhesive in shower  2. Abnormal uterine bleeding (AUB) Resolved s/p hysterectomy. Benign pathology  3. Endometriosis Will monitor symptoms around typical time of menses.    Activity restrictions: no lifting more than 10 pounds and pelvic rest (avoid masturbation as well) Follow up: at 8 weeks for cuff check  Carter Quarry, MD Obstetrician & Gynecologist, [facility] for [employer], [facility]

## 2023-12-02 ENCOUNTER — Encounter: Payer: Self-pay | Admitting: Medical

## 2023-12-26 ENCOUNTER — Telehealth: Admitting: Family Medicine

## 2023-12-26 DIAGNOSIS — R0602 Shortness of breath: Secondary | ICD-10-CM

## 2023-12-26 DIAGNOSIS — J111 Influenza due to unidentified influenza virus with other respiratory manifestations: Secondary | ICD-10-CM

## 2023-12-26 DIAGNOSIS — R051 Acute cough: Secondary | ICD-10-CM

## 2023-12-26 MED ORDER — OSELTAMIVIR PHOSPHATE 75 MG PO CAPS: 75.0000 mg | ORAL_CAPSULE | Freq: Two times a day (BID) | ORAL | 0 refills | Status: AC

## 2023-12-26 MED ORDER — PROMETHAZINE-DM 6.25-15 MG/5ML PO SYRP: 5.0000 mL | ORAL_SOLUTION | Freq: Four times a day (QID) | ORAL | 0 refills | Status: AC | PRN

## 2023-12-26 MED ORDER — PREDNISONE 20 MG PO TABS: 20.0000 mg | ORAL_TABLET | Freq: Two times a day (BID) | ORAL | 0 refills | Status: AC

## 2023-12-26 NOTE — Progress Notes (Signed)
 Thank you for the details you included in the comment boxes. Those details are very helpful in determining the best course of treatment for you and help us  to provide the best care.Because of you asthma history and illness with symptoms impacting breathing, we recommend that you schedule a Virtual Urgent Care video visit in order for the provider to better assess what is going on.  The provider will be able to give you a more accurate diagnosis and treatment plan if we can more freely discuss your symptoms and with the addition of a virtual examination.    If you change your visit to a video visit, we will bill your insurance (similar to an office visit) and you will not be charged for this e-Visit. You will be able to stay at home and speak with the first available [facility] advanced practice provider. The link to do a video visit is in the drop down Menu tab of your Welcome screen in MyChart.

## 2023-12-26 NOTE — Progress Notes (Signed)
 Virtual Visit Consent   Carol Marquez, you are scheduled for a virtual visit with a [facility] provider today. Just as with appointments in the office, your consent must be obtained to participate. Your consent will be active for this visit and any virtual visit you may have with one of our providers in the next 365 days. If you have a MyChart account, a copy of this consent can be sent to you electronically.  As this is a virtual visit, video technology does not allow for your provider to perform a traditional examination. This may limit your provider's ability to fully assess your condition. If your provider identifies any concerns that need to be evaluated in person or the need to arrange testing (such as labs, EKG, etc.), we will make arrangements to do so. Although advances in technology are sophisticated, we cannot ensure that it will always work on either your end or our end. If the connection with a video visit is poor, the visit may have to be switched to a telephone visit. With either a video or telephone visit, we are not always able to ensure that we have a secure connection.  By engaging in this virtual visit, you consent to the provision of healthcare and authorize for your insurance to be billed (if applicable) for the services provided during this visit. Depending on your insurance coverage, you may receive a charge related to this service.  I need to obtain your verbal consent now. Are you willing to proceed with your visit today? Carol Marquez has provided verbal consent on 12/26/2023 for a virtual visit (video or telephone). Loa Lamp, FNP  Date: 12/26/2023 5:21 PM   Virtual Visit via Video Note   I, Loa Lamp, connected with  Carol Marquez  ([id number], 17-Feb-1990) on 12/26/2023 at  5:15 PM EST by a video-enabled telemedicine application and verified that I am speaking with the correct person using two identifiers.  Location: Patient: Virtual Visit Location  Patient: Home Provider: Virtual Visit Location Provider: Home Office   I discussed the limitations of evaluation and management by telemedicine and the availability of in person appointments. The patient expressed understanding and agreed to proceed.    History of Present Illness: Carol Marquez is a 33 y.o. who identifies as a female who was assigned female at birth, and is being seen today for 2 days including cough, brain fog, hot and cold fever, body aches, dry cough, keeping her up at night. History of asthma. Neg covid test at home. Feels like flu to her and her friends.   HPI: HPI  Problems:  Patient Active Problem List   Diagnosis Date Noted   Dysmenorrhea 11/12/2023   Pelvic pain 11/12/2023   Depression, recurrent 06/27/2022   PTSD (post-traumatic stress disorder) 06/27/2022   PMDD (premenstrual dysphoric disorder) 06/27/2022   Mild persistent asthma without complication 07/12/2021   Calculus of gallbladder without cholecystitis without obstruction 04/08/2019   Depression 04/08/2019   Seasonal allergies 04/08/2019   Class 2 obesity with body mass index (BMI) of 35.0 to 35.9 in adult 04/08/2019   Health care maintenance 09/25/2018   Chronic motor tic disorder 09/25/2018   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  Resting comfortably  at home.  Head is normocephalic,  atraumatic.  No labored breathing.  Speech is clear and coherent with logical content.  Patient is alert and oriented at baseline.    Assessment and Plan: 1. Influenza-like illness (Primary)  Increase fluids, humidifier at night, tylenol  or ibuprofen  as directed, UC as needed.   Follow Up Instructions: I  discussed the assessment and treatment plan with the patient. The patient was provided an opportunity to ask questions and all were answered. The patient agreed with the plan and demonstrated an understanding of the instructions.  A copy of instructions were sent to the patient via MyChart unless otherwise noted below.     The patient was advised to call back or seek an in-person evaluation if the symptoms worsen or if the condition fails to improve as anticipated.    Kaitlin Ardito, FNP     [1]  Allergies Allergen Reactions   Amoxicillin  Anaphylaxis   Bee Venom Anaphylaxis and Swelling   Penicillins Anaphylaxis   Shellfish Allergy Anaphylaxis and Hives    PT STATED ONLY CRABS Per pt iodine  ok   Fire Ant Swelling   Codeine Nausea Only   Morphine  Nausea Only  [2]  Current Outpatient Medications:    oseltamivir  (TAMIFLU ) 75 MG capsule, Take 1 capsule (75 mg total) by mouth 2 (two) times daily for 5 days., Disp: 10 capsule, Rfl: 0   predniSONE  (DELTASONE ) 20 MG tablet, Take 1 tablet (20 mg total) by mouth 2 (two) times daily with a meal for 5 days., Disp: 10 tablet, Rfl: 0   promethazine -dextromethorphan (PROMETHAZINE -DM) 6.25-15 MG/5ML syrup, Take 5 mLs by mouth 4 (four) times daily as needed for up to 10 days for cough., Disp: 118 mL, Rfl: 0   acetaminophen  (TYLENOL ) 500 MG tablet, Take 2 tablets (1,000 mg total) by mouth every 6 (six) hours., Disp: 120 tablet, Rfl: 1   Acetaminophen -Caff-Pyrilamine (MIDOL  MAX ST MENSTRUAL) 500-60-15 MG TABS, Take by mouth as needed. (Patient not taking: Reported on 11/29/2023), Disp: , Rfl:    albuterol  (VENTOLIN  HFA) 108 (90 Base) MCG/ACT inhaler, Inhale 2 puffs into the lungs every 4 (four) hours as needed for wheezing or shortness of breath., Disp: 18 g, Rfl: 2   azelastine  (ASTELIN ) 0.1 % nasal spray, Place 2 sprays into both nostrils 2 (two) times daily. Use in each nostril as directed (Patient not taking: Reported on 11/29/2023), Disp: 30 mL, Rfl:  12   drospirenone -ethinyl estradiol  (YASMIN  28) 3-0.03 MG tablet, Take 1 tablet by mouth daily., Disp: 28 tablet, Rfl: 11   EPINEPHrine  0.3 mg/0.3 mL IJ SOAJ injection, Inject 0.3 mg into the muscle as needed for anaphylaxis., Disp: 1 each, Rfl: 0   fluticasone  (FLONASE ) 50 MCG/ACT nasal spray, Place 2 sprays into both nostrils daily. (Patient not taking: Reported on 11/29/2023), Disp: 16 g, Rfl: 0   fluticasone  (FLOVENT  HFA) 110 MCG/ACT inhaler, Inhale 2 puffs into the lungs in the morning and at bedtime. Rinse mouth out after use., Disp: 1 each, Rfl: 1   gabapentin  (NEURONTIN ) 300 MG capsule, Take 1 capsule (300 mg total) by mouth 3 (three) times daily., Disp: 90 capsule, Rfl: 0   ibuprofen  (ADVIL ) 600 MG tablet, Take 1 tablet (600 mg total) by mouth every 6 (six) hours., Disp: 60 tablet, Rfl: 1   metoprolol  succinate (TOPROL  XL) 25 MG 24 hr tablet, Take 1 tablet (25 mg total) by mouth at bedtime., Disp: 90 tablet, Rfl: 3   montelukast  (SINGULAIR ) 10 MG tablet, TAKE 1 TABLET(10 MG) BY MOUTH AT BEDTIME (Patient not taking:  Reported on 11/29/2023), Disp: 90 tablet, Rfl: 2   Multiple Vitamins-Iron (MULTIVITAMIN/IRON PO), Take 2 each by mouth at bedtime. Gummies, Disp: , Rfl:    ondansetron  (ZOFRAN -ODT) 4 MG disintegrating tablet, Take 1 tablet (4 mg total) by mouth every 8 (eight) hours as needed for nausea or vomiting., Disp: 20 tablet, Rfl: 0   oxyCODONE  (OXY IR/ROXICODONE ) 5 MG immediate release tablet, Take 1-2 tablets (5-10 mg total) by mouth every 4 (four) hours as needed for moderate pain (pain score 4-6). (Patient not taking: Reported on 11/29/2023), Disp: 16 tablet, Rfl: 0   pantoprazole  (PROTONIX ) 40 MG tablet, Take 1 tablet (40 mg total) by mouth daily., Disp: 30 tablet, Rfl: 3   polyethylene glycol powder (GLYCOLAX /MIRALAX ) 17 GM/SCOOP powder, Dissolve 1 capful (17g) in 4-8 ounces of liquid and take by mouth daily as needed for mild constipation., Disp: 238 g, Rfl: 0   sertraline  (ZOLOFT )  100 MG tablet, Take 1 tablet (100 mg total) by mouth daily., Disp: 90 tablet, Rfl: 3

## 2023-12-26 NOTE — Progress Notes (Signed)
 Message sent to patient requesting further input regarding current symptoms. Awaiting patient response.

## 2023-12-26 NOTE — Patient Instructions (Signed)

## 2024-01-04 ENCOUNTER — Other Ambulatory Visit: Payer: Self-pay | Admitting: Family Medicine

## 2024-01-04 DIAGNOSIS — M797 Fibromyalgia: Secondary | ICD-10-CM

## 2024-01-06 ENCOUNTER — Other Ambulatory Visit: Payer: Self-pay | Admitting: Cardiovascular Disease

## 2024-01-06 MED ORDER — METOPROLOL SUCCINATE ER 25 MG PO TB24: 25.0000 mg | ORAL_TABLET | Freq: Every evening | ORAL | 0 refills | Status: AC

## 2024-01-16 ENCOUNTER — Ambulatory Visit: Admitting: Obstetrics and Gynecology

## 2024-02-10 ENCOUNTER — Ambulatory Visit: Admitting: Obstetrics and Gynecology
